# Patient Record
Sex: Female | Born: 1955
Health system: Southern US, Community
[De-identification: ages and names within clinical notes are randomized; demographics above are authoritative.]

## PROBLEM LIST (undated history)

## (undated) DIAGNOSIS — Z9221 Personal history of antineoplastic chemotherapy: Secondary | ICD-10-CM

## (undated) DIAGNOSIS — Z923 Personal history of irradiation: Secondary | ICD-10-CM

## (undated) DIAGNOSIS — C801 Malignant (primary) neoplasm, unspecified: Secondary | ICD-10-CM

## (undated) HISTORY — PX: BREAST SURGERY: SHX581

## (undated) HISTORY — PX: BREAST LUMPECTOMY: SHX2

---

## 2018-07-21 ENCOUNTER — Other Ambulatory Visit (HOSPITAL_COMMUNITY): Payer: Self-pay | Admitting: *Deleted

## 2018-07-21 DIAGNOSIS — N644 Mastodynia: Secondary | ICD-10-CM

## 2018-07-21 DIAGNOSIS — Z853 Personal history of malignant neoplasm of breast: Secondary | ICD-10-CM

## 2018-08-11 ENCOUNTER — Ambulatory Visit
Admission: RE | Admit: 2018-08-11 | Discharge: 2018-08-11 | Disposition: A | Discharge status: Home / Self Care | Payer: No Typology Code available for payment source | Source: Ambulatory Visit | Attending: Obstetrics and Gynecology | Admitting: Obstetrics and Gynecology

## 2018-08-11 ENCOUNTER — Ambulatory Visit (HOSPITAL_COMMUNITY)
Admission: RE | Admit: 2018-08-11 | Discharge: 2018-08-11 | Disposition: A | Discharge status: Home / Self Care | Payer: Self-pay | Source: Ambulatory Visit | Attending: Obstetrics and Gynecology | Admitting: Obstetrics and Gynecology

## 2018-08-11 ENCOUNTER — Encounter (HOSPITAL_COMMUNITY): Payer: Self-pay

## 2018-08-11 ENCOUNTER — Ambulatory Visit: Payer: Self-pay

## 2018-08-11 VITALS — BP 130/72 | Ht 65.0 in | Wt 180.5 lb

## 2018-08-11 DIAGNOSIS — N644 Mastodynia: Secondary | ICD-10-CM

## 2018-08-11 DIAGNOSIS — N6311 Unspecified lump in the right breast, upper outer quadrant: Secondary | ICD-10-CM

## 2018-08-11 DIAGNOSIS — Z853 Personal history of malignant neoplasm of breast: Secondary | ICD-10-CM

## 2018-08-11 DIAGNOSIS — Z01419 Encounter for gynecological examination (general) (routine) without abnormal findings: Secondary | ICD-10-CM

## 2018-08-11 HISTORY — DX: Malignant (primary) neoplasm, unspecified: C80.1

## 2018-08-11 HISTORY — DX: Personal history of antineoplastic chemotherapy: Z92.21

## 2018-08-11 HISTORY — DX: Personal history of irradiation: Z92.3

## 2018-08-11 NOTE — Progress Notes (Signed)
Complaints of right breast pain x 2 months that comes and goes. Patient rates the pain at a 5 out of 10. Patient stated she noticed some blood at the area of her breast surgery when the pain started.  Pap Smear: Pap smear completed today. Last Pap smear was in 2015 and abnormal per patient. Per patient had a colposcopy completed for follow-up. Patient stated she has a history of multiple abnormal Pap smears. Patient stated she has a history of a hysterectomy around 22 years ago for fibroids but was told she still has her cervix. No Pap smear or hysterectomy results in Epic.  Physical exam: Breasts Right breast slightly smaller than left breast due to history of right breast lumpectomy for breast cancer in 2007. No skin abnormalities left breast. Scar observed on right lower breast around 6 o'clock. No nipple retraction bilateral breasts. No nipple discharge bilateral breasts. No lymphadenopathy. No lumps palpated left breast. Palpated a lump within the right breast at 10 o'clock 5 cm from the nipple. Complaints of tenderness when palpated lump and area around lump. Referred patient to the [facility] for a diagnostic mammogram and right breast ultrasound. Appointment scheduled for Tuesday, August 11, 2018 at 1050.        Pelvic/Bimanual   Ext Genitalia No lesions, no swelling and no discharge observed on external genitalia.         Vagina Vagina pink and normal texture. No lesions or discharge observed in vagina.          Cervix Cervix is present. Cervix pink and of normal texture. No discharge observed.     Uterus Uterus is absent to history of hysterectomy for benign reasons.       Adnexae Bilateral ovaries present and palpable. No tenderness on palpation.         Rectovaginal No rectal exam completed today since patient had no rectal complaints. No skin abnormalities observed on exam.    Smoking History: Patient is a current smoker. Discussed smoking cessation with  patient. Referred to the [facility] Quitline and gave resources to free smoking cessation classes at [facility].  Patient Navigation: Patient education provided. Access to services provided for patient through [place] program.   Colorectal Cancer Screening: Per patient has never had a colonoscopy completed. No complaints today. FIT Test given to patient to complete and return to [facility].  Breast and Cervical Cancer Risk Assessment: Patient has no family history of breast cancer, known genetic mutations, or radiation treatment to the chest before age 9. Per patient has a history of cervical dysplasia. Patient has no history of being immunocompromised or DES exposure in-utero.  Risk Assessment    Risk Scores      08/11/2018   Last edited by: Loletta Parish, RN   5-year risk: 2.6 %   Lifetime risk: 13 %

## 2018-08-11 NOTE — Patient Instructions (Addendum)
Explained breast self awareness with [name]. Let patient know that the results of today's Pap smear will determine follow-up. Referred patient to the [facility] for a diagnostic mammogram and right breast ultrasound. Appointment scheduled for Tuesday, August 11, 2018 at 1050. Patient aware of appointment and will be there. Let patient know will follow up with her within the next couple weeks with results of Pap smear by letter or phone. Discussed smoking cessation with patient. Referred to the [facility] Quitline and gave resources to free smoking cessation classes at [facility]. Catherine Tyler verbalized understanding.  Alexsandro Salek, Arvil Chaco, RN 10:42 AM

## 2018-08-12 ENCOUNTER — Other Ambulatory Visit (HOSPITAL_COMMUNITY): Payer: Self-pay | Admitting: *Deleted

## 2018-08-12 ENCOUNTER — Inpatient Hospital Stay: Payer: No Typology Code available for payment source | Attending: Obstetrics and Gynecology | Admitting: *Deleted

## 2018-08-12 ENCOUNTER — Inpatient Hospital Stay: Payer: No Typology Code available for payment source

## 2018-08-12 VITALS — BP 145/85 | Ht 65.0 in | Wt 181.9 lb

## 2018-08-12 DIAGNOSIS — Z0000 Encounter for general adult medical examination without abnormal findings: Secondary | ICD-10-CM

## 2018-08-12 LAB — LIPID PANEL
Cholesterol: 272 mg/dL — ABNORMAL HIGH (ref 0–200)
HDL: 46 mg/dL (ref >40)
LDL CALC: 185 mg/dL — AB (ref 0–99)
Total CHOL/HDL Ratio: 5.9 RATIO
Triglycerides: 204 mg/dL — ABNORMAL HIGH (ref <150)
VLDL: 41 mg/dL — ABNORMAL HIGH (ref 0–40)

## 2018-08-12 LAB — HEMOGLOBIN A1C
HEMOGLOBIN A1C: 5.5 % (ref 4.8–5.6)
Mean Plasma Glucose: 111.15 mg/dL

## 2018-08-12 NOTE — Progress Notes (Signed)
[facility] initial screening  Clinical Measurement:  Height: 65in  Weight: 181.9lb  Blood Pressure: 140/87 Blood Pressure #2: 145/85 Fasting Labs Drawn Today, will review with patient when they result.  Medical History:  Patient states that she has not been diagnosed with high cholesterol,  diabetes or heart disease. Patient states she has been diagnosed with high blood pressure.  Medications:  Patients states she is not taking any medications for high cholesterol, or diabetes.  Patient states she is taking medication for high blood pressure.   She is not taking aspirin daily to prevent heart attack or stroke.    Blood pressure, self measurement:  Patients states she does  measure blood pressure at home.    Nutrition:  Patient states she eats 1 cup of fruit and 3 cups of vegetables in an average day.  Patient states she does  eat fish regularly, she eats more than half a serving of whole grains daily. She drinks less than 36 ounces of beverages with added sugar weekly.  She is currently watching her sodium intake.  She has  had drinks containing alcohol in the last seven days.    Physical activity:  Patient states that she gets 210 minutes of moderate exercise in a week.  She gets 0 minutes of vigorous exercise per week.    Smoking status:  Patient states she has never smoked and is not around any smokers.    Quality of life:  Patient states that she has had 0 bad physical days out of the last 30 days. In the last 2 weeks, she has had several  days that she has felt down or depressed. She has had 0 days in the last 2 weeks that she has had little interest or pleasure in doing things.  Risk reduction and counseling:  Patient states she wants to lose weight and increase fruit and vegetable intake.  I encouraged her to continue with current exercise regimen and increase vegetable and fruit intake.  Navigation:  I will notify patient of lab results.  Patient is aware of 2 more health coaching sessions  and a follow up.

## 2018-08-13 LAB — CYTOLOGY - PAP
DIAGNOSIS: NEGATIVE
HPV: NOT DETECTED

## 2018-08-14 ENCOUNTER — Telehealth (HOSPITAL_COMMUNITY): Payer: Self-pay | Admitting: *Deleted

## 2018-08-14 NOTE — Telephone Encounter (Signed)
  Addendum- I made an appointment for Catherine Tyler for [facility] on August 21st at 10:15am.

## 2018-08-14 NOTE — Telephone Encounter (Signed)
Health coaching 2  Labs- cholesterol 272, triglycerides 204, LDL 185, HDL 46, hemoglobin A1C 5.5, mean plasma glucose 111.15  Patient is aware and understands her lab results.  Goals- I encouraged patient to eat more lean meats, fish and skinless chicken, also to choose low-fat or fat free dairy options and to exercise moderately 150 minutes weekly. Patient states she walks 30 minutes daily and cooks vegetables oven roasted in olive oil.    Navigation:   Patient is aware of  more health coaching sessions and a follow up.  Time- 10 minutes

## 2018-08-19 ENCOUNTER — Ambulatory Visit: Payer: No Typology Code available for payment source

## 2018-08-19 ENCOUNTER — Other Ambulatory Visit: Payer: Self-pay | Admitting: Obstetrics and Gynecology

## 2018-08-25 ENCOUNTER — Encounter (HOSPITAL_COMMUNITY): Payer: Self-pay

## 2018-08-25 LAB — FECAL OCCULT BLOOD, IMMUNOCHEMICAL: Fecal Occult Bld: NEGATIVE

## 2018-08-25 LAB — SPECIMEN STATUS REPORT

## 2018-09-01 ENCOUNTER — Encounter (HOSPITAL_COMMUNITY): Payer: Self-pay | Admitting: *Deleted

## 2018-09-01 NOTE — Progress Notes (Signed)
Letter mailed to patient with negative pap smear results. HPV was negative. Next pap smear due in one year due to history of abnormal pap smears.  

## 2018-09-02 ENCOUNTER — Ambulatory Visit (INDEPENDENT_AMBULATORY_CARE_PROVIDER_SITE_OTHER): Payer: Self-pay | Admitting: Internal Medicine

## 2018-09-02 VITALS — BP 151/89 | HR 78 | Temp 97.7°F | Ht 65.0 in | Wt 183.5 lb

## 2018-09-02 DIAGNOSIS — Z8249 Family history of ischemic heart disease and other diseases of the circulatory system: Secondary | ICD-10-CM

## 2018-09-02 DIAGNOSIS — Z0000 Encounter for general adult medical examination without abnormal findings: Secondary | ICD-10-CM

## 2018-09-02 DIAGNOSIS — F329 Major depressive disorder, single episode, unspecified: Secondary | ICD-10-CM

## 2018-09-02 DIAGNOSIS — Z79899 Other long term (current) drug therapy: Secondary | ICD-10-CM

## 2018-09-02 DIAGNOSIS — I10 Essential (primary) hypertension: Secondary | ICD-10-CM

## 2018-09-02 DIAGNOSIS — Z923 Personal history of irradiation: Secondary | ICD-10-CM

## 2018-09-02 DIAGNOSIS — Z853 Personal history of malignant neoplasm of breast: Secondary | ICD-10-CM

## 2018-09-02 DIAGNOSIS — E785 Hyperlipidemia, unspecified: Secondary | ICD-10-CM | POA: Insufficient documentation

## 2018-09-02 DIAGNOSIS — M79672 Pain in left foot: Secondary | ICD-10-CM

## 2018-09-02 DIAGNOSIS — F17210 Nicotine dependence, cigarettes, uncomplicated: Secondary | ICD-10-CM

## 2018-09-02 DIAGNOSIS — Z23 Encounter for immunization: Secondary | ICD-10-CM

## 2018-09-02 DIAGNOSIS — F32A Depression, unspecified: Secondary | ICD-10-CM | POA: Insufficient documentation

## 2018-09-02 DIAGNOSIS — M79671 Pain in right foot: Secondary | ICD-10-CM

## 2018-09-02 DIAGNOSIS — F419 Anxiety disorder, unspecified: Secondary | ICD-10-CM

## 2018-09-02 DIAGNOSIS — Z7189 Other specified counseling: Secondary | ICD-10-CM | POA: Insufficient documentation

## 2018-09-02 DIAGNOSIS — Z9221 Personal history of antineoplastic chemotherapy: Secondary | ICD-10-CM

## 2018-09-02 DIAGNOSIS — Z8541 Personal history of malignant neoplasm of cervix uteri: Secondary | ICD-10-CM

## 2018-09-02 MED ORDER — LISINOPRIL 10 MG PO TABS: 10.0000 mg | ORAL_TABLET | Freq: Every day | ORAL | 11 refills | Status: DC

## 2018-09-02 MED ORDER — ROSUVASTATIN CALCIUM 5 MG PO TABS: 5.0000 mg | ORAL_TABLET | Freq: Every day | ORAL | 11 refills | Status: DC

## 2018-09-02 MED ORDER — DULOXETINE HCL 30 MG PO CPEP: 30.0000 mg | ORAL_CAPSULE | Freq: Every day | ORAL | 2 refills | Status: DC

## 2018-09-02 MED FILL — LISINOPRIL 10 MG TABLET: 10 | 30 days supply | Qty: 30 | Fill #0

## 2018-09-02 MED FILL — DULoxetine HCL 30 MG CPEP: 30 | 30 days supply | Qty: 30 | Fill #0

## 2018-09-02 MED FILL — ROSUVASTATIN CALCIUM 5 MG T: 5 | 30 days supply | Qty: 30 | Fill #0

## 2018-09-02 NOTE — Assessment & Plan Note (Signed)
Recent lipid profile done at [facility] program shows elevated total cholesterol, triglyceride and LDL. Her ASCVD risk score is 17.7% without treatment which can be decreased to 2.3% with risk modification. She will need moderate to high intensity statin. Patient she has tried Lipitor in the past which causes headache and her.  We will start her on low-dose Crestor at 5 mg daily-we will titrate according to her response.

## 2018-09-02 NOTE — Assessment & Plan Note (Signed)
Appears little depressed and tearful during clinic visit. PHQ 9 score was 18. She is feeling more depressed and anxious since the death of her sister in Jul 06, 2018.  No suicidal ideations.  We will check TSH. Start her on Cymbalta 30 mg daily as it can help with her depression and foot pain. Follow-up in 1 month.

## 2018-09-02 NOTE — Assessment & Plan Note (Signed)
BP Readings from Last 3 Encounters:  09/02/18 (!) 151/89  08/12/18 (!) 145/85  08/11/18 130/72   Her blood pressure was elevated. Patient ran out of her lisinopril for the past month and never went back to get it because of financial reasons.  -Restart lisinopril at 10 mg daily. -Check BMP. -Follow-up in 1 month.

## 2018-09-02 NOTE — Progress Notes (Addendum)
CC: Hypertension from [place] program.  HPI:  [name] is a 62 y.o. 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 program because of elevated blood pressure.  According to patient she has elevated blood pressure in the past and was given lisinopril 10 mg daily from a minute clinic at [facility], patient ran out of her prescription for the past 1 month and never went back because of the cost issues.  She denies any headache, blurry vision, chest pain, exertional dyspnea, orthopnea or PND.  She was complaining of bilateral foot pain more on left, pain is on her plantar surface describes it as sharp, worse in the morning when she get out of bed, lasted for about 10 minutes and resolved on its own, pain normally come back in the afternoon when she remained on her feet for long time.  She occasionally uses ibuprofen which help with her pain.  She denies any swelling or fever.  She is able to carry on her daily living without any difficulty.  She was also complaining of some anxiety and depression since 06-28-2018 when her sister passed away at the age of 5 due to lung cancer.  Her PHQ 9 score today was 18 and she would like some medication to help with her symptoms. She denies any heat or cold intolerance, dry skin or change in her bowel habits or appetite.  She denies any suicidal ideations.  Addendum.  Her labs which include CBC, BMP and TSH are all within normal limit.  Past Medical History:  Diagnosis Date  . Cancer ([place])   . Personal history of chemotherapy   . Personal history of radiation therapy    Family history.  Multiple family members with hypertension.  Sister died of lung cancer at the age of 36.  Social history.  Lives unknown, have a part-time job.  Smokes about 4 to 5 cigarettes/day since  the age 89, occasionally drinks a beer, approximately 1-2 times per week and denies any illicit drug use.  Review of Systems: Negative except mentioned in HPI.  Physical Exam:  Vitals:   09/02/18 1028  BP: (!) 151/89  Pulse: 78  Temp: 97.7 F (36.5 C)  TempSrc: Oral  SpO2: 98%  Weight: 183 lb 8 oz (83.2 kg)  Height: 5\' 5"  (1.651 m)   General: Vital signs reviewed.  Patient is well-developed and well-nourished, in no acute distress and cooperative with exam.  Head: Normocephalic and atraumatic. Eyes: EOMI, conjunctivae normal, no scleral icterus.  Neck: Supple, trachea midline, normal ROM, no JVD, masses, thyromegaly, or carotid bruit present.  Cardiovascular: RRR, S1 normal, S2 normal, no murmurs, gallops, or rubs. Pulmonary/Chest: Clear to auscultation bilaterally, no wheezes, rales, or rhonchi. Abdominal: Soft, non-tender, non-distended, BS +, Musculoskeletal: No joint deformities, erythema, or stiffness, ROM full and nontender. Extremities: No lower extremity edema bilaterally,  pulses symmetric and intact bilaterally. No cyanosis or clubbing. Neurological: A&O x3, Strength is normal and symmetric bilaterally, cranial nerve II-XII are grossly intact, no focal motor deficit, sensory intact to light touch bilaterally.  Skin: Warm, dry and intact.  Healing clustered back bite marks on her anterior right axilla and upper right breast. Psychiatric: Normal mood and affect. speech and behavior is normal. Cognition and memory are normal.  Assessment & Plan:   See Encounters Tab for problem  based charting.  Patient discussed with Dr. Evette Doffing.

## 2018-09-02 NOTE — Patient Instructions (Addendum)
Thank you for visiting clinic today. We discussed I am starting you 3 medications today, 1 lisinopril which you were used to take before for your blood pressure as your blood pressure was little high. Second I am starting you on Cymbalta 30 mg daily which can help with your foot pain and depression and anxiety. I am also starting you on a low-dose cholesterol medicine because of your high cholesterol, as high cholesterol along with high blood pressure makes you high risk for heart attacks and stroke. We are checking some labs which include your thyroid and kidney function, we will call you with any abnormal results. You are also provided with flu shot today. I am also putting some exercises to do to help with your foot pain. You can use ibuprofen as needed to help with your pain.  follow-up in 1 month for your blood pressure check.  Plantar Fasciitis Rehab Ask your health care provider which exercises are safe for you. Do exercises exactly as told by your health care provider and adjust them as directed. It is normal to feel mild stretching, pulling, tightness, or discomfort as you do these exercises, but you should stop right away if you feel sudden pain or your pain gets worse. Do not begin these exercises until told by your health care provider. Stretching and range of motion exercises These exercises warm up your muscles and joints and improve the movement and flexibility of your foot. These exercises also help to relieve pain. Exercise A: Plantar fascia stretch  1. Sit with your left / right leg crossed over your opposite knee. 2. Hold your heel with one hand with that thumb near your arch. With your other hand, hold your toes and gently pull them back toward the top of your foot. You should feel a stretch on the bottom of your toes or your foot or both. 3. Hold this stretch for__________ seconds. 4. Slowly release your toes and return to the starting position. Repeat __________ times.  Complete this exercise __________ times a day. Exercise B: Gastroc, standing  1. Stand with your hands against a wall. 2. Extend your left / right leg behind you, and bend your front knee slightly. 3. Keeping your heels on the floor and keeping your back knee straight, shift your weight toward the wall without arching your back. You should feel a gentle stretch in your left / right calf. 4. Hold this position for __________ seconds. Repeat __________ times. Complete this exercise __________ times a day. Exercise C: Soleus, standing 1. Stand with your hands against a wall. 2. Extend your left / right leg behind you, and bend your front knee slightly. 3. Keeping your heels on the floor, bend your back knee and slightly shift your weight over the back leg. You should feel a gentle stretch deep in your calf. 4. Hold this position for __________ seconds. Repeat __________ times. Complete this exercise __________ times a day. Exercise D: Gastrocsoleus, standing 1. Stand with the ball of your left / right foot on a step. The ball of your foot is on the walking surface, right under your toes. 2. Keep your other foot firmly on the same step. 3. Hold onto the wall or a railing for balance. 4. Slowly lift your other foot, allowing your body weight to press your heel down over the edge of the step. You should feel a stretch in your left / right calf. 5. Hold this position for __________ seconds. 6. Return both feet to the  step. 7. Repeat this exercise with a slight bend in your left / right knee. Repeat __________ times with your left / right knee straight and __________ times with your left / right knee bent. Complete this exercise __________ times a day. Balance exercise This exercise builds your balance and strength control of your arch to help take pressure off your plantar fascia. Exercise E: Single leg stand 1. Without shoes, stand near a railing or in a doorway. You may hold onto the railing or  door frame as needed. 2. Stand on your left / right foot. Keep your big toe down on the floor and try to keep your arch lifted. Do not let your foot roll inward. 3. Hold this position for __________ seconds. 4. If this exercise is too easy, you can try it with your eyes closed or while standing on a pillow. Repeat __________ times. Complete this exercise __________ times a day. This information is not intended to replace advice given to you by your health care provider. Make sure you discuss any questions you have with your health care provider. Document Released: 12/16/2005 Document Revised: 08/20/2016 Document Reviewed: 10/30/2015 Elsevier Interactive Patient Education  2018 [place].   Plantar Fasciitis Plantar fasciitis is a painful foot condition that affects the heel. It occurs when the band of tissue that connects the toes to the heel bone (plantar fascia) becomes irritated. This can happen after exercising too much or doing other repetitive activities (overuse injury). The pain from plantar fasciitis can range from mild irritation to severe pain that makes it difficult for you to walk or move. The pain is usually worse in the morning or after you have been sitting or lying down for a while. What are the causes? This condition may be caused by:  Standing for long periods of time.  Wearing shoes that do not fit.  Doing high-impact activities, including running, aerobics, and ballet.  Being overweight.  Having an abnormal way of walking (gait).  Having tight calf muscles.  Having high arches in your feet.  Starting a new athletic activity.  What are the signs or symptoms? The main symptom of this condition is heel pain. Other symptoms include:  Pain that gets worse after activity or exercise.  Pain that is worse in the morning or after resting.  Pain that goes away after you walk for a few minutes.  How is this diagnosed? This condition may be diagnosed based on  your signs and symptoms. Your health care provider will also do a physical exam to check for:  A tender area on the bottom of your foot.  A high arch in your foot.  Pain when you move your foot.  Difficulty moving your foot.  You may also need to have imaging studies to confirm the diagnosis. These can include:  X-rays.  Ultrasound.  MRI.  How is this treated? Treatment for plantar fasciitis depends on the severity of the condition. Your treatment may include:  Rest, ice, and over-the-counter pain medicines to manage your pain.  Exercises to stretch your calves and your plantar fascia.  A splint that holds your foot in a stretched, upward position while you sleep (night splint).  Physical therapy to relieve symptoms and prevent problems in the future.  Cortisone injections to relieve severe pain.  Extracorporeal shock wave therapy (ESWT) to stimulate damaged plantar fascia with electrical impulses. It is often used as a last resort before surgery.  Surgery, if other treatments have not worked after  12 months.  Follow these instructions at home:  Take medicines only as directed by your health care provider.  Avoid activities that cause pain.  Roll the bottom of your foot over a bag of ice or a bottle of cold water. Do this for 20 minutes, 3-4 times a day.  Perform simple stretches as directed by your health care provider.  Try wearing athletic shoes with air-sole or gel-sole cushions or soft shoe inserts.  Wear a night splint while sleeping, if directed by your health care provider.  Keep all follow-up appointments with your health care provider. How is this prevented?  Do not perform exercises or activities that cause heel pain.  Consider finding low-impact activities if you continue to have problems.  Lose weight if you need to. The best way to prevent plantar fasciitis is to avoid the activities that aggravate your plantar fascia. Contact a health care  provider if:  Your symptoms do not go away after treatment with home care measures.  Your pain gets worse.  Your pain affects your ability to move or do your daily activities. This information is not intended to replace advice given to you by your health care provider. Make sure you discuss any questions you have with your health care provider. Document Released: 09/10/2001 Document Revised: 05/20/2016 Document Reviewed: 10/26/2014 Elsevier Interactive Patient Education  [employer].

## 2018-09-02 NOTE — Assessment & Plan Note (Signed)
Patient has a recent negative fecal occult blood test done in August 2019. Had a normal mammogram and Pap smear done recently. Recent A1c done was normal at 5.5 Recent lipid profile shows dyslipidemia with elevated total cholesterol, triglyceride and LDL. She was provided with flu shot today.

## 2018-09-03 LAB — BMP8+ANION GAP
Anion Gap: 15 mmol/L (ref 10.0–18.0)
BUN/Creatinine Ratio: 28 (ref 12–28)
BUN: 22 mg/dL (ref 8–27)
CO2: 24 mmol/L (ref 20–29)
CREATININE: 0.78 mg/dL (ref 0.57–1.00)
Calcium: 9.4 mg/dL (ref 8.7–10.3)
Chloride: 101 mmol/L (ref 96–106)
GFR calc non Af Amer: 83 mL/min/{1.73_m2} (ref >59)
GFR, EST AFRICAN AMERICAN: 96 mL/min/{1.73_m2} (ref >59)
Glucose: 81 mg/dL (ref 65–99)
Potassium: 5 mmol/L (ref 3.5–5.2)
Sodium: 140 mmol/L (ref 134–144)

## 2018-09-03 LAB — CBC
Hematocrit: 43.3 % (ref 34.0–46.6)
Hemoglobin: 14.9 g/dL (ref 11.1–15.9)
MCH: 29.2 pg (ref 26.6–33.0)
MCHC: 34.4 g/dL (ref 31.5–35.7)
MCV: 85 fL (ref 79–97)
Platelets: 267 10*3/uL (ref 150–450)
RBC: 5.11 x10E6/uL (ref 3.77–5.28)
RDW: 13 % (ref 12.3–15.4)
WBC: 7.6 10*3/uL (ref 3.4–10.8)

## 2018-09-03 LAB — TSH: TSH: 2.02 u[IU]/mL (ref 0.450–4.500)

## 2018-09-03 NOTE — Progress Notes (Signed)
Internal Medicine Clinic Attending  Case discussed with Dr. Amin at the time of the visit.  We reviewed the resident's history and exam and pertinent patient test results.  I agree with the assessment, diagnosis, and plan of care documented in the resident's note.    

## 2018-09-25 ENCOUNTER — Telehealth (HOSPITAL_COMMUNITY): Payer: Self-pay | Admitting: *Deleted

## 2018-09-25 NOTE — Telephone Encounter (Signed)
Health coaching 3   Goals- Patient states she is walking 3 to 4 times weekly for 30 minute intervals.  Patient states she is making healthier choices with regard to food, like choosing lean meats, eating fish( salmon or mackerel) 2 times weekly and eating more fruit and vegetables (3 or 4 daily).  Patient states she cooks with olive oil and canola oil and snacks on walnuts and almonds.  Navigation:  Patient is aware of  a follow up.  Time- 10 minutes

## 2018-10-26 MED FILL — LISINOPRIL 10 MG TABLET: 10 | 30 days supply | Qty: 30 | Fill #1

## 2018-11-10 ENCOUNTER — Emergency Department (HOSPITAL_COMMUNITY): Payer: Self-pay

## 2018-11-10 ENCOUNTER — Encounter (HOSPITAL_COMMUNITY): Payer: Self-pay | Admitting: Emergency Medicine

## 2018-11-10 ENCOUNTER — Emergency Department (HOSPITAL_COMMUNITY)
Admission: EM | Admit: 2018-11-10 | Discharge: 2018-11-10 | Disposition: A | Discharge status: Home / Self Care | Payer: Self-pay | Attending: Emergency Medicine | Admitting: Emergency Medicine

## 2018-11-10 ENCOUNTER — Other Ambulatory Visit: Payer: Self-pay

## 2018-11-10 DIAGNOSIS — Y929 Unspecified place or not applicable: Secondary | ICD-10-CM | POA: Insufficient documentation

## 2018-11-10 DIAGNOSIS — I10 Essential (primary) hypertension: Secondary | ICD-10-CM | POA: Insufficient documentation

## 2018-11-10 DIAGNOSIS — F17210 Nicotine dependence, cigarettes, uncomplicated: Secondary | ICD-10-CM | POA: Insufficient documentation

## 2018-11-10 DIAGNOSIS — R1012 Left upper quadrant pain: Secondary | ICD-10-CM | POA: Insufficient documentation

## 2018-11-10 DIAGNOSIS — S0003XA Contusion of scalp, initial encounter: Secondary | ICD-10-CM | POA: Insufficient documentation

## 2018-11-10 DIAGNOSIS — Y939 Activity, unspecified: Secondary | ICD-10-CM | POA: Insufficient documentation

## 2018-11-10 DIAGNOSIS — Z79899 Other long term (current) drug therapy: Secondary | ICD-10-CM | POA: Insufficient documentation

## 2018-11-10 DIAGNOSIS — W19XXXA Unspecified fall, initial encounter: Secondary | ICD-10-CM

## 2018-11-10 DIAGNOSIS — W109XXA Fall (on) (from) unspecified stairs and steps, initial encounter: Secondary | ICD-10-CM | POA: Insufficient documentation

## 2018-11-10 DIAGNOSIS — Y999 Unspecified external cause status: Secondary | ICD-10-CM | POA: Insufficient documentation

## 2018-11-10 LAB — CBC WITH DIFFERENTIAL/PLATELET
Abs Immature Granulocytes: 0.04 10*3/uL (ref 0.00–0.07)
Basophils Absolute: 0.1 10*3/uL (ref 0.0–0.1)
Basophils Relative: 0 %
EOS ABS: 0 10*3/uL (ref 0.0–0.5)
EOS PCT: 0 %
HCT: 50.2 % — ABNORMAL HIGH (ref 36.0–46.0)
HEMOGLOBIN: 16.3 g/dL — AB (ref 12.0–15.0)
Immature Granulocytes: 0 %
LYMPHS PCT: 11 %
Lymphs Abs: 1.4 10*3/uL (ref 0.7–4.0)
MCH: 28.8 pg (ref 26.0–34.0)
MCHC: 32.5 g/dL (ref 30.0–36.0)
MCV: 88.8 fL (ref 80.0–100.0)
MONO ABS: 0.5 10*3/uL (ref 0.1–1.0)
Monocytes Relative: 4 %
Neutro Abs: 10.6 10*3/uL — ABNORMAL HIGH (ref 1.7–7.7)
Neutrophils Relative %: 85 %
Platelets: 273 10*3/uL (ref 150–400)
RBC: 5.65 MIL/uL — ABNORMAL HIGH (ref 3.87–5.11)
RDW: 12.7 % (ref 11.5–15.5)
WBC: 12.7 10*3/uL — ABNORMAL HIGH (ref 4.0–10.5)
nRBC: 0 % (ref 0.0–0.2)

## 2018-11-10 LAB — COMPREHENSIVE METABOLIC PANEL
ALK PHOS: 91 U/L (ref 38–126)
ALT: 42 U/L (ref 0–44)
AST: 35 U/L (ref 15–41)
Albumin: 4 g/dL (ref 3.5–5.0)
Anion gap: 8 (ref 5–15)
BUN: 10 mg/dL (ref 6–20)
CALCIUM: 9.5 mg/dL (ref 8.9–10.3)
CHLORIDE: 103 mmol/L (ref 98–111)
CO2: 26 mmol/L (ref 22–32)
CREATININE: 0.73 mg/dL (ref 0.44–1.00)
Glucose, Bld: 126 mg/dL — ABNORMAL HIGH (ref 70–99)
Potassium: 4.5 mmol/L (ref 3.5–5.1)
SODIUM: 137 mmol/L (ref 135–145)
Total Bilirubin: 0.5 mg/dL (ref 0.3–1.2)
Total Protein: 7.9 g/dL (ref 6.5–8.1)

## 2018-11-10 MED ORDER — IOHEXOL 300 MG/ML  SOLN
100.0000 mL | Freq: Once | INTRAMUSCULAR | Status: AC | PRN
  Administered 2018-11-10: 100 mL via INTRAVENOUS

## 2018-11-10 MED ORDER — METHOCARBAMOL 500 MG PO TABS: 500.0000 mg | ORAL_TABLET | Freq: Two times a day (BID) | ORAL | 0 refills | Status: DC

## 2018-11-10 MED ORDER — ONDANSETRON HCL 4 MG/2ML IJ SOLN
4.0000 mg | Freq: Once | INTRAMUSCULAR | Status: AC
  Administered 2018-11-10: 4 mg via INTRAVENOUS
  Filled 2018-11-10: qty 2

## 2018-11-10 MED ORDER — PROMETHAZINE HCL 25 MG/ML IJ SOLN
6.2500 mg | Freq: Once | INTRAMUSCULAR | Status: AC
  Administered 2018-11-10: 6.25 mg via INTRAVENOUS
  Filled 2018-11-10: qty 1

## 2018-11-10 MED ORDER — ONDANSETRON 4 MG PO TBDP: 4.0000 mg | ORAL_TABLET | Freq: Three times a day (TID) | ORAL | 0 refills | Status: DC | PRN

## 2018-11-10 MED ORDER — SODIUM CHLORIDE 0.9 % IV BOLUS
500.0000 mL | Freq: Once | INTRAVENOUS | Status: AC
  Administered 2018-11-10: 500 mL via INTRAVENOUS

## 2018-11-10 MED ORDER — MORPHINE SULFATE (PF) 4 MG/ML IV SOLN
4.0000 mg | Freq: Once | INTRAVENOUS | Status: AC
  Administered 2018-11-10: 4 mg via INTRAVENOUS
  Filled 2018-11-10: qty 1

## 2018-11-10 MED ORDER — MORPHINE SULFATE (PF) 2 MG/ML IV SOLN
2.0000 mg | Freq: Once | INTRAVENOUS | Status: AC
  Administered 2018-11-10: 2 mg via INTRAVENOUS
  Filled 2018-11-10: qty 1

## 2018-11-10 MED ORDER — NAPROXEN 375 MG PO TABS: 375.0000 mg | ORAL_TABLET | Freq: Two times a day (BID) | ORAL | 0 refills | Status: DC

## 2018-11-10 NOTE — ED Notes (Signed)
Patient transported to X-ray 

## 2018-11-10 NOTE — ED Notes (Signed)
RN called regarding delay in scan

## 2018-11-10 NOTE — Discharge Instructions (Addendum)
You were seen in the emergency department today for a fall.  Your lab work did not show any significant abnormalities. Your White blood cell count was a bit elevated this could be due to the trauma.  Your imaging did not show any fractures, dislocations, or internal bleeding. You have a scalp hematoma- large bruise.  Your Abdominal CT scan showed some incidental findings that were not related to your fall. You have some stones in your kidney. You also have prominence of your left adrenal gland- this is something to discuss with your primary care provider- they may wish to order some blood work to further evaluate this. The formal read is listed below.   IMPRESSION: No evidence of injury in the abdomen or pelvis.  No organ injury. Bilateral nephrolithiasis. There is prominence of the left adrenal gland without focal mass. Findings may represent unilateral hyperplasia or underlying endocrine abnormality. Correlate clinically as for the need for further laboratory testing. Electronically Signed   By: Marybelle Killings M.D.   On: 11/10/2018 13:45   We are sending you home with medications to help with your discomfort:  - Naproxen is a nonsteroidal anti-inflammatory medication that will help with pain and swelling. Be sure to take this medication as prescribed with food, 1 pill every 12 hours,  It should be taken with food, as it can cause stomach upset, and more seriously, stomach bleeding. Do not take other nonsteroidal anti-inflammatory medications with this such as Advil, Motrin, Aleve, Mobic, Goodie Powder, or Motrin.    - Robaxin is the muscle relaxer I have prescribed, this is meant to help with muscle tightness. Be aware that this medication may make you drowsy therefore the first time you take this it should be at a time you are in an environment where you can rest. Do not drive or operate heavy machinery when taking this medication. Do not drink alcohol or take other sedating medications with this  medicine such as narcotics or benzodiazepines.  - Zofran- take every 8 hours as needed for nausea/vomiting. Allow this to dissolve under your tongue.   You make take Tylenol per over the counter dosing with these medications.   We have prescribed you new medication(s) today. Discuss the medications prescribed today with your pharmacist as they can have adverse effects and interactions with your other medicines including over the counter and prescribed medications. Seek medical evaluation if you start to experience new or abnormal symptoms after taking one of these medicines, seek care immediately if you start to experience difficulty breathing, feeling of your throat closing, facial swelling, or rash as these could be indications of a more serious allergic reaction  Please follow-up with your primary care provider within the next 1 week for reevaluation.  Return to the ER for new or worsening symptoms or any other concerns.

## 2018-11-10 NOTE — ED Provider Notes (Signed)
[facility] EMERGENCY DEPARTMENT Provider Note   CSN: [id number] Arrival date & time: 11/10/18  9735     History   Chief Complaint Chief Complaint  Patient presents with  . Fall  . Head Injury    HPI Catherine Tyler is a 62 y.o. female with a hx of HTN who presents to the ED s/p mechanical fall last night at 1900 with complaints of head, L side, and L upper leg pain. Patient was ambulating up the stairs when she miss-stepped and fell to her L side. Denies prodromal dizziness/lightheadedness/chest pain/dyspnea. States she hit her L side and L upper leg on two different steps and subsequently hit the back of her head- denies LOC. She did not fall down multiple stairs. She was able to get up and ambulate without assistance. States she woke up this AM with bruising and pain that is a 6/10 in severity. Relays nausea w/ 1 episode of emesis this AM. Denies change in vision, numbness, weakness, hematuria, or hematochezia.   Patient relays she has difficulty with narcotics, states she only does okay with low dose morphine- requesting if we give this type of medication we start low and go slow.   HPI  Past Medical History:  Diagnosis Date  . Cancer ([place])   . Personal history of chemotherapy   . Personal history of radiation therapy     Patient Active Problem List   Diagnosis Date Noted  . Hypertension 09/02/2018  . Depression 09/02/2018  . Need for immunization against influenza 09/02/2018  . Healthcare maintenance 09/02/2018  . Dyslipidemia 09/02/2018    Past Surgical History:  Procedure Laterality Date  . BREAST LUMPECTOMY    . BREAST SURGERY       OB History    Gravida  4   Para      Term      Preterm      AB  2   Living  1     SAB      TAB  2   Ectopic      Multiple      Live Births  1            Home Medications    Prior to Admission medications   Medication Sig Start Date End Date Taking? Authorizing Provider  DULoxetine  (CYMBALTA) 30 MG capsule Take 1 capsule (30 mg total) by mouth daily. 09/02/18 09/02/19  Lorella Nimrod, MD  lisinopril (PRINIVIL,ZESTRIL) 10 MG tablet Take 1 tablet (10 mg total) by mouth daily. 09/02/18 09/02/19  Lorella Nimrod, MD  rosuvastatin (CRESTOR) 5 MG tablet Take 1 tablet (5 mg total) by mouth at bedtime. 09/02/18 09/02/19  Lorella Nimrod, MD    Family History Family History  Problem Relation Age of Onset  . Hyperlipidemia Mother   . Heart failure Mother     Social History Social History   Tobacco Use  . Smoking status: Current Some Day Smoker  . Smokeless tobacco: Never Used  Substance Use Topics  . Alcohol use: Yes    Comment: on occasion  . Drug use: Never     Allergies   Sulfa antibiotics   Review of Systems Review of Systems  Constitutional: Negative for chills and fever.  Respiratory: Negative for shortness of breath.   Cardiovascular: Negative for chest pain.  Gastrointestinal: Positive for abdominal pain (L side). Negative for blood in stool.  Genitourinary: Negative for hematuria.  Musculoskeletal: Positive for arthralgias (L hip) and myalgias (L upper leg). Negative  for back pain and neck pain.  Skin: Positive for color change (bruising).  Neurological: Positive for headaches. Negative for weakness and numbness.       Negative for incontinence  All other systems reviewed and are negative.    Physical Exam Updated Vital Signs BP (!) 123/94   Pulse 97   Temp 97.8 F (36.6 C) (Oral)   Resp 16   SpO2 97%   Physical Exam  Constitutional: She appears well-developed and well-nourished. No distress.  HENT:  Head: Head is without raccoon's eyes and without Battle's sign.  Right Ear: No drainage. No hemotympanum.  Left Ear: No drainage. No hemotympanum.  Nose: No rhinorrhea.  Mouth/Throat: Uvula is midline and oropharynx is clear and moist.  L sided parietal hematoma that is tender to palpation.   Eyes: Pupils are equal, round, and reactive to light.  Conjunctivae and EOM are normal. Right eye exhibits no discharge. Left eye exhibits no discharge.  Neck: Normal range of motion. Spinous process tenderness (diffuse without palpable step off) and muscular tenderness (bilateral) present.  Cardiovascular: Normal rate and regular rhythm.  No murmur heard. Pulses:      Posterior tibial pulses are 2+ on the right side, and 2+ on the left side.  Pulmonary/Chest: Breath sounds normal. No respiratory distress. She has no wheezes. She has no rhonchi. She has no rales. She exhibits no tenderness, no crepitus, no edema, no deformity, no swelling and no retraction.  Abdominal: Soft. She exhibits no distension. There is tenderness (and left side in location consistent with ecchymosis) in the left upper quadrant. There is no rigidity, no rebound and no guarding.    Musculoskeletal:  Patient of mild bruising to the left lateral thigh area.  No obvious deformity, swelling, significant open wounds, or increased warmth. Back: No point/focal vertebral tenderness to palpation to the lumbar or thoracic spine. Upper extremities: Normal range of motion without bony tenderness Lower extremities: Full active and most of the, knees, and ankles.  She is tender directly over the bruising to the left lateral thigh.  Lower extremities are otherwise nontender.  No pelvis tenderness to palpation.  Skin: Skin is warm and dry. No rash noted.  Psychiatric: She has a normal mood and affect. Her behavior is normal.  Nursing note and vitals reviewed.    ED Treatments / Results  Labs (all labs ordered are listed, but only abnormal results are displayed) Labs Reviewed  COMPREHENSIVE METABOLIC PANEL - Abnormal; Notable for the following components:      Result Value   Glucose, Bld 126 (*)    All other components within normal limits  CBC WITH DIFFERENTIAL/PLATELET - Abnormal; Notable for the following components:   WBC 12.7 (*)    RBC 5.65 (*)    Hemoglobin 16.3 (*)    HCT  50.2 (*)    Neutro Abs 10.6 (*)    All other components within normal limits    EKG None  Radiology Ct Head Wo Contrast  Result Date: 11/10/2018 CLINICAL DATA:  PT fell while walking up her back steps. PT has bruising to left side and left upper leg. PT struck her head as well. PT reports she has a swollen area on head. PT woke up this morning feeling sick and vomited. EXAM: CT HEAD WITHOUT CONTRAST CT CERVICAL SPINE WITHOUT CONTRAST TECHNIQUE: Multidetector CT imaging of the head and cervical spine was performed following the standard protocol without intravenous contrast. Multiplanar CT image reconstructions of the cervical spine were also generated.  COMPARISON:  None. FINDINGS: CT HEAD FINDINGS Brain: No evidence of acute infarction, hemorrhage, hydrocephalus, extra-axial collection or mass lesion/mass effect. Vascular: No hyperdense vessel. Cerebrovascular atherosclerotic disease. Skull: No osseous abnormality. Sinuses/Orbits: Visualized paranasal sinuses are clear. Visualized mastoid sinuses are clear. Visualized orbits demonstrate no focal abnormality. Other: Left parietal scalp hematoma. CT CERVICAL SPINE FINDINGS Alignment: Normal. Skull base and vertebrae: No acute fracture. No primary bone lesion or focal pathologic process. Soft tissues and spinal canal: No prevertebral fluid or swelling. No visible canal hematoma. Disc levels: Disc spaces are relatively well maintained. Moderate left facet arthropathy at C2-3. No foraminal stenosis. Upper chest: Right apical scarring. Other: No fluid collection or hematoma. IMPRESSION: 1. No acute intracranial pathology. 2.  No acute osseous injury of the cervical spine. 3. Large left parietal scalp hematoma. Electronically Signed   By: Kathreen Devoid   On: 11/10/2018 13:42   Ct Cervical Spine Wo Contrast  Result Date: 11/10/2018 CLINICAL DATA:  PT fell while walking up her back steps. PT has bruising to left side and left upper leg. PT struck her head as  well. PT reports she has a swollen area on head. PT woke up this morning feeling sick and vomited. EXAM: CT HEAD WITHOUT CONTRAST CT CERVICAL SPINE WITHOUT CONTRAST TECHNIQUE: Multidetector CT imaging of the head and cervical spine was performed following the standard protocol without intravenous contrast. Multiplanar CT image reconstructions of the cervical spine were also generated. COMPARISON:  None. FINDINGS: CT HEAD FINDINGS Brain: No evidence of acute infarction, hemorrhage, hydrocephalus, extra-axial collection or mass lesion/mass effect. Vascular: No hyperdense vessel. Cerebrovascular atherosclerotic disease. Skull: No osseous abnormality. Sinuses/Orbits: Visualized paranasal sinuses are clear. Visualized mastoid sinuses are clear. Visualized orbits demonstrate no focal abnormality. Other: Left parietal scalp hematoma. CT CERVICAL SPINE FINDINGS Alignment: Normal. Skull base and vertebrae: No acute fracture. No primary bone lesion or focal pathologic process. Soft tissues and spinal canal: No prevertebral fluid or swelling. No visible canal hematoma. Disc levels: Disc spaces are relatively well maintained. Moderate left facet arthropathy at C2-3. No foraminal stenosis. Upper chest: Right apical scarring. Other: No fluid collection or hematoma. IMPRESSION: 1. No acute intracranial pathology. 2.  No acute osseous injury of the cervical spine. 3. Large left parietal scalp hematoma. Electronically Signed   By: Kathreen Devoid   On: 11/10/2018 13:42   Ct Abdomen Pelvis W Contrast  Result Date: 11/10/2018 CLINICAL DATA:  Fall walking up stairs EXAM: CT ABDOMEN AND PELVIS WITH CONTRAST TECHNIQUE: Multidetector CT imaging of the abdomen and pelvis was performed using the standard protocol following bolus administration of intravenous contrast. CONTRAST:  130mL OMNIPAQUE IOHEXOL 300 MG/ML  SOLN COMPARISON:  None. FINDINGS: Lower chest: Dependent atelectasis. 3 mm left lower lobe pulmonary nodule on image 5.  Hepatobiliary: Diffuse hepatic steatosis.  Normal gallbladder. Pancreas: Unremarkable Spleen: Unremarkable Adrenals/Urinary Tract: Right adrenal gland is within normal limits. There is prominence of the left adrenal gland without focal mass. The gland maintains its normal morphology without evidence of hemorrhage. Small calculus in the upper pole of the left kidney measures 3 mm. Tiny calculi in the lower poles of both kidneys. No evidence of hydronephrosis or renal mass. No evidence of renal injury. Bladder is within normal limits. Stomach/Bowel: Normal appendix. No obvious mass in the colon. No evidence of small-bowel obstruction. Vascular/Lymphatic: Aortic atherosclerotic calcifications. No abnormal retroperitoneal adenopathy. Reproductive: Uterus is absent.  Adnexa are within normal limits. Other: No free fluid.  No hemoperitoneum. Musculoskeletal: No vertebral compression deformity. Advanced  degenerative disc disease at L5-S1. IMPRESSION: No evidence of injury in the abdomen or pelvis.  No organ injury. Bilateral nephrolithiasis. There is prominence of the left adrenal gland without focal mass. Findings may represent unilateral hyperplasia or underlying endocrine abnormality. Correlate clinically as for the need for further laboratory testing. Electronically Signed   By: Marybelle Killings M.D.   On: 11/10/2018 13:45   Dg Femur Min 2 Views Left  Result Date: 11/10/2018 CLINICAL DATA:  Left upper leg pain since a fall last night. Bruising. EXAM: LEFT FEMUR 2 VIEWS COMPARISON:  None. FINDINGS: There is no fracture or dislocation. Uniform joint space narrowing of the left hip joint. Soft tissues appear normal. IMPRESSION: No acute abnormalities. Electronically Signed   By: Lorriane Shire M.D.   On: 11/10/2018 10:21    Procedures Procedures (including critical care time)  Medications Ordered in ED Medications  ondansetron (ZOFRAN) injection 4 mg (has no administration in time range)  morphine 2 MG/ML  injection 2 mg (has no administration in time range)    Initial Impression / Assessment and Plan / ED Course  I have reviewed the triage vital signs and the nursing notes.  Pertinent labs & imaging results that were available during my care of the patient were reviewed by me and considered in my medical decision making (see chart for details).   Patient presents to the emergency department multiple mechanical fall with headache, left side, and left upper leg pain.  Patient nontoxic-appearing, vitals without significant abnormality- intermittent BP elevation- doubt HTN emergency.  Will obtain CT head and cervical spine per [place] head CT/C-spine rules.  Additionally we will obtain basic labs with CT abdomen/pelvis with contrast secondary to bruising to left side of the abdomen with left upper quadrant tenderness, concern for possible splenic injury- patient currently appears hemodynamically stable  Plain film of L femur ordered- negative for fracture/dislocation, NVI distally.   Patient's labs unremarkable, non specific leukocytosis and elevation in hgb. No anemia. No significant electrolyte disturbance. Renal function preserved. Imaging is negative for acute traumatic injury other than notable left parietal scalp hematoma.  There is no evidence of intracranial bleed, fracture/dislocation of the spine or the upper portion of the left lower extremity, or internal organ injury including the spleen. Patient ambulatory without neuro deficits, tolerating PO in the ED. Feel she is safe for discharge at this time. Will discharge home with naproxen, robaxin, and zofran- patient aware she is not to drive or operate heavy machinery when taking robaxin. I discussed results- including incidental adrenal findings, treatment plan, need for follow-up, and return precautions with the patient. Provided opportunity for questions, patient confirmed understanding and is in agreement with plan.    Final Clinical  Impressions(s) / ED Diagnoses   Final diagnoses:  Fall, initial encounter  Hematoma of scalp, initial encounter    ED Discharge Orders         Ordered    naproxen (NAPROSYN) 375 MG tablet  2 times daily     11/10/18 1421    methocarbamol (ROBAXIN) 500 MG tablet  2 times daily     11/10/18 1421    ondansetron (ZOFRAN ODT) 4 MG disintegrating tablet  Every 8 hours PRN     11/10/18 1421           Braeden Dolinski, [place] R, PA-C 11/10/18 1426    Lajean Saver, MD 11/10/18 1442

## 2018-11-10 NOTE — ED Notes (Signed)
Patient transported to CT 

## 2018-11-10 NOTE — ED Triage Notes (Signed)
PT fell while walking up her back steps. PT has bruising to left side and left upper leg. PT struck her head as well. PT reports she has a swollen area on head. PT woke up this morning feeling sick and vomited.

## 2018-12-07 MED FILL — LISINOPRIL 10 MG TABS: 10 | 30 days supply | Qty: 30 | Fill #2

## 2019-01-11 MED FILL — LISINOPRIL 10 MG TABS: 10 | 30 days supply | Qty: 30 | Fill #3

## 2019-02-16 MED FILL — LISINOPRIL 10 MG TABS: 10 | 30 days supply | Qty: 30 | Fill #4

## 2019-03-22 MED FILL — LISINOPRIL 10 MG TABS: 10 | 30 days supply | Qty: 30 | Fill #5 | Status: TO

## 2019-04-30 MED FILL — LISINOPRIL 10 MG TABLET: 10 | 30 days supply | Qty: 30 | Fill #0

## 2019-06-03 MED FILL — LISINOPRIL 10 MG TABLET: 10 | 30 days supply | Qty: 30 | Fill #1

## 2019-07-07 MED FILL — LISINOPRIL 10 MG TABS: 10 | 30 days supply | Qty: 30 | Fill #0

## 2019-08-13 MED FILL — LISINOPRIL 10 MG TABS: 10 | 30 days supply | Qty: 30 | Fill #1

## 2019-09-21 ENCOUNTER — Ambulatory Visit: Payer: No Typology Code available for payment source

## 2020-01-19 ENCOUNTER — Ambulatory Visit: Payer: No Typology Code available for payment source | Attending: Internal Medicine

## 2020-01-19 DIAGNOSIS — Z20822 Contact with and (suspected) exposure to covid-19: Secondary | ICD-10-CM | POA: Insufficient documentation

## 2020-01-20 LAB — NOVEL CORONAVIRUS, NAA: SARS-CoV-2, NAA: NOT DETECTED

## 2020-04-27 ENCOUNTER — Other Ambulatory Visit: Payer: Self-pay

## 2020-04-27 ENCOUNTER — Emergency Department (HOSPITAL_COMMUNITY)
Admission: EM | Admit: 2020-04-27 | Discharge: 2020-04-27 | Disposition: A | Discharge status: Home / Self Care | Payer: Medicaid Other | Attending: Emergency Medicine | Admitting: Emergency Medicine

## 2020-04-27 ENCOUNTER — Encounter (HOSPITAL_COMMUNITY): Payer: Self-pay | Admitting: Emergency Medicine

## 2020-04-27 ENCOUNTER — Emergency Department (HOSPITAL_COMMUNITY): Payer: Medicaid Other

## 2020-04-27 DIAGNOSIS — Z720 Tobacco use: Secondary | ICD-10-CM | POA: Diagnosis not present

## 2020-04-27 DIAGNOSIS — N200 Calculus of kidney: Secondary | ICD-10-CM | POA: Diagnosis not present

## 2020-04-27 DIAGNOSIS — R112 Nausea with vomiting, unspecified: Secondary | ICD-10-CM | POA: Insufficient documentation

## 2020-04-27 DIAGNOSIS — Z79899 Other long term (current) drug therapy: Secondary | ICD-10-CM | POA: Insufficient documentation

## 2020-04-27 DIAGNOSIS — N130 Hydronephrosis with ureteropelvic junction obstruction: Secondary | ICD-10-CM | POA: Insufficient documentation

## 2020-04-27 DIAGNOSIS — R1031 Right lower quadrant pain: Secondary | ICD-10-CM | POA: Diagnosis present

## 2020-04-27 LAB — URINALYSIS, ROUTINE W REFLEX MICROSCOPIC
Bacteria, UA: NONE SEEN
Bilirubin Urine: NEGATIVE
Glucose, UA: 150 mg/dL — AB
Ketones, ur: NEGATIVE mg/dL
Leukocytes,Ua: NEGATIVE
Nitrite: NEGATIVE
Protein, ur: NEGATIVE mg/dL
Specific Gravity, Urine: 1.012 (ref 1.005–1.030)
pH: 7 (ref 5.0–8.0)

## 2020-04-27 LAB — CBC
HCT: 47.5 % — ABNORMAL HIGH (ref 36.0–46.0)
Hemoglobin: 15.4 g/dL — ABNORMAL HIGH (ref 12.0–15.0)
MCH: 29.6 pg (ref 26.0–34.0)
MCHC: 32.4 g/dL (ref 30.0–36.0)
MCV: 91.3 fL (ref 80.0–100.0)
Platelets: 276 10*3/uL (ref 150–400)
RBC: 5.2 MIL/uL — ABNORMAL HIGH (ref 3.87–5.11)
RDW: 13 % (ref 11.5–15.5)
WBC: 15 10*3/uL — ABNORMAL HIGH (ref 4.0–10.5)
nRBC: 0 % (ref 0.0–0.2)

## 2020-04-27 LAB — COMPREHENSIVE METABOLIC PANEL
ALT: 40 U/L (ref 0–44)
AST: 34 U/L (ref 15–41)
Albumin: 3.9 g/dL (ref 3.5–5.0)
Alkaline Phosphatase: 197 U/L — ABNORMAL HIGH (ref 38–126)
Anion gap: 11 (ref 5–15)
BUN: 14 mg/dL (ref 8–23)
CO2: 22 mmol/L (ref 22–32)
Calcium: 8.7 mg/dL — ABNORMAL LOW (ref 8.9–10.3)
Chloride: 104 mmol/L (ref 98–111)
Creatinine, Ser: 0.79 mg/dL (ref 0.44–1.00)
GFR calc Af Amer: >60 mL/min (ref >60)
GFR calc non Af Amer: >60 mL/min (ref >60)
Glucose, Bld: 176 mg/dL — ABNORMAL HIGH (ref 70–99)
Potassium: 3.3 mmol/L — ABNORMAL LOW (ref 3.5–5.1)
Sodium: 137 mmol/L (ref 135–145)
Total Bilirubin: 0.7 mg/dL (ref 0.3–1.2)
Total Protein: 7 g/dL (ref 6.5–8.1)

## 2020-04-27 LAB — LIPASE, BLOOD: Lipase: 34 U/L (ref 11–51)

## 2020-04-27 MED ORDER — ONDANSETRON HCL 4 MG/2ML IJ SOLN
4.0000 mg | Freq: Once | INTRAMUSCULAR | Status: AC
  Administered 2020-04-27: 4 mg via INTRAVENOUS
  Filled 2020-04-27: qty 2

## 2020-04-27 MED ORDER — ONDANSETRON 4 MG PO TBDP: 4.0000 mg | ORAL_TABLET | Freq: Three times a day (TID) | ORAL | 0 refills | Status: DC | PRN

## 2020-04-27 MED ORDER — ACETAMINOPHEN 500 MG PO TABS
1000.0000 mg | ORAL_TABLET | Freq: Once | ORAL | Status: AC
  Administered 2020-04-27: 11:00:00 1000 mg via ORAL
  Filled 2020-04-27: qty 2

## 2020-04-27 MED ORDER — MORPHINE SULFATE 15 MG PO TABS: 7.5000 mg | ORAL_TABLET | ORAL | 0 refills | Status: DC | PRN

## 2020-04-27 MED ORDER — SODIUM CHLORIDE 0.9 % IV BOLUS
1000.0000 mL | Freq: Once | INTRAVENOUS | Status: AC
  Administered 2020-04-27: 1000 mL via INTRAVENOUS

## 2020-04-27 MED ORDER — MORPHINE SULFATE (PF) 4 MG/ML IV SOLN
4.0000 mg | Freq: Once | INTRAVENOUS | Status: AC
  Administered 2020-04-27: 4 mg via INTRAVENOUS
  Filled 2020-04-27: qty 1

## 2020-04-27 MED ORDER — TAMSULOSIN HCL 0.4 MG PO CAPS: 0.4000 mg | ORAL_CAPSULE | Freq: Every day | ORAL | 0 refills | Status: DC

## 2020-04-27 MED ORDER — OXYCODONE HCL 5 MG PO TABS
5.0000 mg | ORAL_TABLET | Freq: Once | ORAL | Status: AC
  Administered 2020-04-27: 5 mg via ORAL
  Filled 2020-04-27: qty 1

## 2020-04-27 MED ORDER — KETOROLAC TROMETHAMINE 30 MG/ML IJ SOLN
15.0000 mg | Freq: Once | INTRAMUSCULAR | Status: AC
  Administered 2020-04-27: 15 mg via INTRAVENOUS
  Filled 2020-04-27: qty 1

## 2020-04-27 NOTE — Discharge Instructions (Signed)
Return for worsening pain, fever or inability to eat or drink  Take 4 over the counter ibuprofen tablets 3 times a day or 2 over-the-counter naproxen tablets twice a day for pain. Also take tylenol 1000mg (2 extra strength) four times a day.   Then take the pain medicine if you feel like you need it. Narcotics do not help with the pain, they only make you care about it less.  You can become addicted to this, people may break into your house to steal it.  It will constipate you.  If you drive under the influence of this medicine you can get a DUI.

## 2020-04-27 NOTE — ED Notes (Signed)
Patient transported to CT 

## 2020-04-27 NOTE — ED Notes (Signed)
Bladder scanner showed 333ml

## 2020-04-27 NOTE — ED Triage Notes (Signed)
Pt arrives via EMS with right flank pain, emesis and vaginal pressure. States she thinks she might have a UTI. EMS gave 50 mcg fentanyl, 500 ml NS and 4 mg zofran.

## 2020-04-27 NOTE — ED Provider Notes (Signed)
[facility] EMERGENCY DEPARTMENT Provider Note   CSN: [id number] Arrival date & time: 04/27/20  [id number]     History Chief Complaint  Patient presents with  . Flank Pain  . Emesis    Catherine Tyler is a 64 y.o. female.  64 yo F with a chief complaints of right lower quadrant abdominal pain.  Started acutely this evening.  States it radiates up into her right flank and she has some feeling like she needs to urinate but cannot.  Has had nausea and vomiting with this.  Pain is sharp and shooting.  Seems to come and go.  Nothing seems to make it better or worse.  No appreciable fevers but has had some chills.  No diarrhea.  No pain like this previously.   The history is provided by the patient.  Flank Pain This is a new problem. The current episode started yesterday. The problem occurs constantly. The problem has not changed since onset.Associated symptoms include abdominal pain. Pertinent negatives include no chest pain, no headaches and no shortness of breath. Nothing aggravates the symptoms. Nothing relieves the symptoms. She has tried nothing for the symptoms. The treatment provided no relief.  Emesis Associated symptoms: abdominal pain and chills   Associated symptoms: no arthralgias, no fever, no headaches and no myalgias        Past Medical History:  Diagnosis Date  . Cancer ([place])   . Personal history of chemotherapy   . Personal history of radiation therapy     Patient Active Problem List   Diagnosis Date Noted  . Hypertension 09/02/2018  . Depression 09/02/2018  . Need for immunization against influenza 09/02/2018  . Healthcare maintenance 09/02/2018  . Dyslipidemia 09/02/2018    Past Surgical History:  Procedure Laterality Date  . BREAST LUMPECTOMY    . BREAST SURGERY       OB History    Gravida  4   Para      Term      Preterm      AB  2   Living  1     SAB      TAB  2   Ectopic      Multiple      Live Births  1             Family History  Problem Relation Age of Onset  . Hyperlipidemia Mother   . Heart failure Mother     Social History   Tobacco Use  . Smoking status: Current Some Day Smoker  . Smokeless tobacco: Never Used  Substance Use Topics  . Alcohol use: Yes    Comment: on occasion  . Drug use: Never    Home Medications Prior to Admission medications   Medication Sig Start Date End Date Taking? Authorizing Provider  lisinopril (PRINIVIL,ZESTRIL) 10 MG tablet Take 1 tablet (10 mg total) by mouth daily. 09/02/18 04/27/20 Yes Lorella Nimrod, MD  loratadine (CLARITIN) 10 MG tablet Take 10 mg by mouth daily.   Yes [provider]  naproxen (NAPROSYN) 375 MG tablet Take 1 tablet (375 mg total) by mouth 2 (two) times daily. 11/10/18  Yes Petrucelli, Samantha R, PA-C  tetrahydrozoline-zinc (VISINE-AC) 0.05-0.25 % ophthalmic solution Place 1 drop into both eyes daily as needed (itching).   Yes [provider]  DULoxetine (CYMBALTA) 30 MG capsule Take 1 capsule (30 mg total) by mouth daily. 09/02/18 09/02/19  Lorella Nimrod, MD  methocarbamol (ROBAXIN) 500 MG tablet Take 1 tablet (500  mg total) by mouth 2 (two) times daily. Patient not taking: Reported on 04/27/2020 11/10/18   Petrucelli, Glynda Jaeger, PA-C  morphine (MSIR) 15 MG tablet Take 0.5 tablets (7.5 mg total) by mouth every 4 (four) hours as needed for severe pain. 04/27/20   Deno Etienne, DO  ondansetron (ZOFRAN ODT) 4 MG disintegrating tablet Take 1 tablet (4 mg total) by mouth every 8 (eight) hours as needed for nausea or vomiting. 04/27/20   Deno Etienne, DO  rosuvastatin (CRESTOR) 5 MG tablet Take 1 tablet (5 mg total) by mouth at bedtime. 09/02/18 09/02/19  Lorella Nimrod, MD  tamsulosin (FLOMAX) 0.4 MG CAPS capsule Take 1 capsule (0.4 mg total) by mouth daily after supper. 04/27/20   Deno Etienne, DO    Allergies    Sulfa antibiotics  Review of Systems   Review of Systems  Constitutional: Positive for chills. Negative for fever.  HENT:  Negative for congestion and rhinorrhea.   Eyes: Negative for redness and visual disturbance.  Respiratory: Negative for shortness of breath and wheezing.   Cardiovascular: Negative for chest pain and palpitations.  Gastrointestinal: Positive for abdominal pain, nausea and vomiting.  Genitourinary: Positive for difficulty urinating and flank pain. Negative for dysuria and urgency.  Musculoskeletal: Negative for arthralgias and myalgias.  Skin: Negative for pallor and wound.  Neurological: Negative for dizziness and headaches.    Physical Exam Updated Vital Signs BP (!) 103/48 (BP Location: Right Arm)   Pulse (!) 109   Temp 98.6 F (37 C) (Oral)   Resp 18   Ht 5\' 5"  (1.651 m)   Wt 77.1 kg   SpO2 94%   BMI 28.29 kg/m   Physical Exam Vitals and nursing note reviewed.  Constitutional:      General: She is not in acute distress.    Appearance: She is well-developed. She is not diaphoretic.     Comments: Appears uncomfortable  HENT:     Head: Normocephalic and atraumatic.  Eyes:     Pupils: Pupils are equal, round, and reactive to light.  Cardiovascular:     Rate and Rhythm: Normal rate and regular rhythm.     Heart sounds: No murmur. No friction rub. No gallop.   Pulmonary:     Effort: Pulmonary effort is normal.     Breath sounds: No wheezing or rales.  Abdominal:     General: There is no distension.     Palpations: Abdomen is soft.     Tenderness: There is no abdominal tenderness.  Musculoskeletal:        General: No tenderness.     Cervical back: Normal range of motion and neck supple.  Skin:    General: Skin is warm and dry.  Neurological:     Mental Status: She is alert and oriented to person, place, and time.  Psychiatric:        Behavior: Behavior normal.     ED Results / Procedures / Treatments   Labs (all labs ordered are listed, but only abnormal results are displayed) Labs Reviewed  COMPREHENSIVE METABOLIC PANEL - Abnormal; Notable for the following  components:      Result Value   Potassium 3.3 (*)    Glucose, Bld 176 (*)    Calcium 8.7 (*)    Alkaline Phosphatase 197 (*)    All other components within normal limits  CBC - Abnormal; Notable for the following components:   WBC 15.0 (*)    RBC 5.20 (*)    Hemoglobin 15.4 (*)  HCT 47.5 (*)    All other components within normal limits  URINALYSIS, ROUTINE W REFLEX MICROSCOPIC - Abnormal; Notable for the following components:   Color, Urine STRAW (*)    Glucose, UA 150 (*)    Hgb urine dipstick SMALL (*)    All other components within normal limits  LIPASE, BLOOD    EKG None  Radiology CT Renal Stone Study  Result Date: 04/27/2020 CLINICAL DATA:  Right flank/right lower quadrant pain EXAM: CT ABDOMEN AND PELVIS WITHOUT CONTRAST TECHNIQUE: Multidetector CT imaging of the abdomen and pelvis was performed following the standard protocol without oral or IV contrast. COMPARISON:  November 10, 2018 FINDINGS: Lower chest: There is mild bibasilar atelectasis. Lung bases otherwise are clear. Old healed rib fractures on the left noted. Benign-appearing calcifications noted in inferior right breast region, stable. Focal hiatal hernia evident. Hepatobiliary: No focal liver lesions are evident on this noncontrast enhanced study. Gallbladder wall is not appreciably thickened. There is no biliary duct dilatation. Pancreas: There is no pancreatic mass or inflammatory focus. Spleen: No splenic lesions are appreciable. Adrenals/Urinary Tract: Right adrenal appears normal. There is stable left adrenal hypertrophy. The right kidney is edematous with mild perinephric fluid. No evident renal mass on either side. There is moderately severe hydronephrosis on the right. There is no hydronephrosis on the left. There is a calculus in the lower pole of the left kidney measuring 4 x 3 mm. There is a second 4 x 3 mm calculus in the upper pole left kidney. There is no intrarenal calculus on the right. There is edema  tracking along the course of the right ureter. There is a 4 x 3 mm calculus at the right ureterovesical junction. No other ureteral calculi are evident. Urinary bladder is midline with wall thickness within normal limits. Stomach/Bowel: There is no appreciable bowel wall or mesenteric thickening. Terminal ileum appears normal. There is lipomatous infiltration of the ileocecal valve. There is no evident bowel obstruction. No free air or portal venous air. Vascular/Lymphatic: There is no appreciable abdominal aortic aneurysm. There is aortic and common iliac artery atherosclerotic calcification bilaterally. There is also bilateral internal iliac artery atherosclerotic calcification. There is no demonstrable adenopathy in the abdomen or pelvis. Reproductive: Uterus absent.  No pelvic mass evident. Other: Appendix appears normal. No evident abscess or ascites in the abdomen or pelvis. Musculoskeletal: There is degenerative change in the lower lumbar spine. There are no blastic or lytic bone lesions. Incomplete visualization of lower rib fractures on the left. 1 No intramuscular or abdominal wall lesions evident. IMPRESSION: 1. Moderately severe hydronephrosis on the right with 4 x 3 mm calculus at the right ureterovesical junction. Right kidney is edematous with mild perinephric fluid. There is also edema tracking along the right ureter. 2.  Nonobstructing calculi in left kidney. 3. No evident bowel obstruction. No abscess in the abdomen or pelvis. Appendix appears normal. 4.  Stable left adrenal hypertrophy.  Right adrenal appears normal. 5.  Uterus absent. 6.  Focal hiatal hernia. 7. Aortic Atherosclerosis (ICD10-I70.0). There also foci of iliac artery and coronary artery calcification.2 Electronically Signed   By: Lowella Grip III M.D.   On: 04/27/2020 10:02    Procedures Procedures (including critical care time)  Medications Ordered in ED Medications  sodium chloride 0.9 % bolus 1,000 mL (0 mLs  Intravenous Stopped 04/27/20 1031)  morphine 4 MG/ML injection 4 mg (4 mg Intravenous Given 04/27/20 0926)  ondansetron (ZOFRAN) injection 4 mg (4 mg Intravenous Given 04/27/20  [id number])  ketorolac (TORADOL) 30 MG/ML injection 15 mg (15 mg Intravenous Given 04/27/20 1028)  acetaminophen (TYLENOL) tablet 1,000 mg (1,000 mg Oral Given 04/27/20 1125)  oxyCODONE (Oxy IR/ROXICODONE) immediate release tablet 5 mg (5 mg Oral Given 04/27/20 1125)    ED Course  I have reviewed the triage vital signs and the nursing notes.  Pertinent labs & imaging results that were available during my care of the patient were reviewed by me and considered in my medical decision making (see chart for details).    MDM Rules/Calculators/A&P                      64 yo F with a chief complaint of right lower quadrant abdominal pain.  Most likely this sounds like a kidney stone as the patient is somewhat writhing on the bed.  No obvious abdominal tenderness on my exam.  Will obtain a stone study reassess.  R sided stone.  72mm.  Pain improved on reassessment though patient still felt like she was not well enough to go home.  Given Toradol and oral pain medicine.  Now feels better.  We will have her follow-up with urology.  12:15 PM:  I have discussed the diagnosis/risks/treatment options with the patient and believe the pt to be eligible for discharge home to follow-up with Urology. We also discussed returning to the ED immediately if new or worsening sx occur. We discussed the sx which are most concerning (e.g., sudden worsening pain, fever, inability to tolerate by mouth) that necessitate immediate return. Medications administered to the patient during their visit and any new prescriptions provided to the patient are listed below.  Medications given during this visit Medications  sodium chloride 0.9 % bolus 1,000 mL (0 mLs Intravenous Stopped 04/27/20 1031)  morphine 4 MG/ML injection 4 mg (4 mg Intravenous Given 04/27/20 0926)    ondansetron (ZOFRAN) injection 4 mg (4 mg Intravenous Given 04/27/20 0926)  ketorolac (TORADOL) 30 MG/ML injection 15 mg (15 mg Intravenous Given 04/27/20 1028)  acetaminophen (TYLENOL) tablet 1,000 mg (1,000 mg Oral Given 04/27/20 1125)  oxyCODONE (Oxy IR/ROXICODONE) immediate release tablet 5 mg (5 mg Oral Given 04/27/20 1125)     The patient appears reasonably screen and/or stabilized for discharge and I doubt any other medical condition or other [facility] requiring further screening, evaluation, or treatment in the ED at this time prior to discharge.     Final Clinical Impression(s) / ED Diagnoses Final diagnoses:  Nephrolithiasis    Rx / DC Orders ED Discharge Orders         Ordered    morphine (MSIR) 15 MG tablet  Every 4 hours PRN     04/27/20 1159    ondansetron (ZOFRAN ODT) 4 MG disintegrating tablet  Every 8 hours PRN     04/27/20 1159    tamsulosin (FLOMAX) 0.4 MG CAPS capsule  Daily after supper     04/27/20 [place], Naomia Lenderman, DO 04/27/20 1215

## 2020-05-30 ENCOUNTER — Other Ambulatory Visit: Payer: Self-pay

## 2020-05-30 ENCOUNTER — Ambulatory Visit (HOSPITAL_COMMUNITY)
Admission: EM | Admit: 2020-05-30 | Discharge: 2020-05-30 | Disposition: A | Discharge status: Home / Self Care | Payer: Self-pay | Attending: Internal Medicine | Admitting: Internal Medicine

## 2020-05-30 ENCOUNTER — Ambulatory Visit (INDEPENDENT_AMBULATORY_CARE_PROVIDER_SITE_OTHER): Payer: Medicaid Other

## 2020-05-30 ENCOUNTER — Encounter (HOSPITAL_COMMUNITY): Payer: Self-pay

## 2020-05-30 DIAGNOSIS — R198 Other specified symptoms and signs involving the digestive system and abdomen: Secondary | ICD-10-CM

## 2020-05-30 DIAGNOSIS — R0789 Other chest pain: Secondary | ICD-10-CM

## 2020-05-30 DIAGNOSIS — G8929 Other chronic pain: Secondary | ICD-10-CM

## 2020-05-30 DIAGNOSIS — Z87448 Personal history of other diseases of urinary system: Secondary | ICD-10-CM

## 2020-05-30 DIAGNOSIS — K5909 Other constipation: Secondary | ICD-10-CM

## 2020-05-30 DIAGNOSIS — N200 Calculus of kidney: Secondary | ICD-10-CM

## 2020-05-30 LAB — POCT URINALYSIS DIP (DEVICE)
Bilirubin Urine: NEGATIVE
Glucose, UA: NEGATIVE mg/dL
Hgb urine dipstick: NEGATIVE
Ketones, ur: NEGATIVE mg/dL
Leukocytes,Ua: NEGATIVE
Nitrite: NEGATIVE
Protein, ur: NEGATIVE mg/dL
Specific Gravity, Urine: 1.015 (ref 1.005–1.030)
Urobilinogen, UA: 0.2 mg/dL (ref 0.0–1.0)
pH: 6 (ref 5.0–8.0)

## 2020-05-30 MED ORDER — DOCUSATE SODIUM 100 MG PO CAPS
100.0000 mg | ORAL_CAPSULE | Freq: Two times a day (BID) | ORAL | 0 refills | Status: DC
Start: 2020-05-30 — End: 2020-06-16

## 2020-05-30 MED ORDER — FLEET ENEMA 7-19 GM/118ML RE ENEM
1.0000 | ENEMA | Freq: Every day | RECTAL | 0 refills | Status: DC | PRN
Start: 2020-05-30 — End: 2020-06-16

## 2020-05-30 NOTE — Discharge Instructions (Addendum)
Please contact [facility] to set up a consult with an urologist for your kidney stones.  You can also contact [facility] to try and establish care with a new PCP for more work-up and management.  If your pain worsens significantly then please report to the emergency room as you will end up needing a CT scan again.   For moderate to severe constipation (not having a bowel movement in more than 3 days) then try to use Miralax or an enema once daily until you have a good bowel movement.  It is not a good idea to use laxatives regularly, for instance daily.  A medication you could use daily to help with promoting bowel movements is docusate (Colace) 50mg -100mg . It is okay to use this 1-2 times daily as needed as a stool softener.  Try to stay active physically including regular exercise 2-3 times a week.  Make sure you hydrate well every day with about 64 ounces of water daily (that is 2 liters).  Try to avoid carb heavy foods, dairy. This includes cutting out breads, pasta, pizza, pastries, potatoes, rice, starchy foods in general. Eat more fiber as listed below:  Salads - kale, spinach, cabbage, spring mix; use seeds like pumpkin seeds or sunflower seeds, almonds; you can also use 1-2 hard boiled eggs in your salads Fruits - avocadoes, berries (blueberries, raspberries, blackberries), apples, oranges, pomegranate, grapefruit Vegetables - aspargus, cauliflower, broccoli, green beans, brussel spouts, bell peppers; stay away from starchy vegetables like potatoes, carrots, peas  Do not eat any foods on this list that you are allergic to.

## 2020-05-30 NOTE — ED Provider Notes (Signed)
[place]   MRN: [id number] DOB: May 18, 1956  Subjective:   Catherine Tyler is a 64 y.o. female presenting for several month history of persistent intermittent chest pressure, belly pressure, difficulty urinating, worsening trouble with constipation and back pain.  Patient states that ever since she ended up going through chemo and radiation for breast cancer, her digestive system changed in for this particular episode she has been using laxatives regularly without good relief.  She does have intermittent bowel movements but are very small.  Patient did become very ill at the end of April, was seen in the emergency room and found to have 3 kidney stones between the left and the right kidneys, with severe hydronephrosis in the ureter and right kidney.  CT also found that she had degenerative disc disease at the lumbar region.  Since then, patient has been able to urinate, does this about 3 or 4 times daily.  She believes that she passed 1 kidney stone.  However, she never saw an urologist.  Does not have a PCP.  Denies history of heart disease, heart attacks.  Denies using opioids.  No current facility-administered medications for this encounter.  Current Outpatient Medications:    DULoxetine (CYMBALTA) 30 MG capsule, Take 1 capsule (30 mg total) by mouth daily., Disp: 30 capsule, Rfl: 2   lisinopril (PRINIVIL,ZESTRIL) 10 MG tablet, Take 1 tablet (10 mg total) by mouth daily., Disp: 30 tablet, Rfl: 11   loratadine (CLARITIN) 10 MG tablet, Take 10 mg by mouth daily., Disp: , Rfl:    methocarbamol (ROBAXIN) 500 MG tablet, Take 1 tablet (500 mg total) by mouth 2 (two) times daily. (Patient not taking: Reported on 04/27/2020), Disp: 20 tablet, Rfl: 0   morphine (MSIR) 15 MG tablet, Take 0.5 tablets (7.5 mg total) by mouth every 4 (four) hours as needed for severe pain., Disp: 5 tablet, Rfl: 0   naproxen (NAPROSYN) 375 MG tablet, Take 1 tablet (375 mg total) by mouth 2 (two) times daily.,  Disp: 20 tablet, Rfl: 0   ondansetron (ZOFRAN ODT) 4 MG disintegrating tablet, Take 1 tablet (4 mg total) by mouth every 8 (eight) hours as needed for nausea or vomiting., Disp: 20 tablet, Rfl: 0   rosuvastatin (CRESTOR) 5 MG tablet, Take 1 tablet (5 mg total) by mouth at bedtime., Disp: 30 tablet, Rfl: 11   tamsulosin (FLOMAX) 0.4 MG CAPS capsule, Take 1 capsule (0.4 mg total) by mouth daily after supper., Disp: 30 capsule, Rfl: 0   tetrahydrozoline-zinc (VISINE-AC) 0.05-0.25 % ophthalmic solution, Place 1 drop into both eyes daily as needed (itching)., Disp: , Rfl:    Allergies  Allergen Reactions   Sulfa Antibiotics Nausea And Vomiting    Past Medical History:  Diagnosis Date   Cancer ([place])    Personal history of chemotherapy    Personal history of radiation therapy      Past Surgical History:  Procedure Laterality Date   BREAST LUMPECTOMY     BREAST SURGERY      Family History  Problem Relation Age of Onset   Hyperlipidemia Mother    Heart failure Mother     Social History   Tobacco Use   Smoking status: Current Some Day Smoker   Smokeless tobacco: Never Used  Substance Use Topics   Alcohol use: Yes    Comment: on occasion   Drug use: Never    ROS   Objective:   Vitals: BP (!) 155/89 (BP Location: Left Arm)    Pulse 93  Temp 98.9 F (37.2 C) (Oral)    Resp (!) 22    SpO2 96%   Physical Exam Constitutional:      General: She is not in acute distress.    Appearance: Normal appearance. She is well-developed and normal weight. She is not ill-appearing, toxic-appearing or diaphoretic.  HENT:     Head: Normocephalic and atraumatic.     Right Ear: External ear normal.     Left Ear: External ear normal.     Nose: Nose normal.     Mouth/Throat:     Mouth: Mucous membranes are moist.     Pharynx: Oropharynx is clear.  Eyes:     General: No scleral icterus.    Extraocular Movements: Extraocular movements intact.     Pupils: Pupils are equal,  round, and reactive to light.  Cardiovascular:     Rate and Rhythm: Normal rate and regular rhythm.     Pulses: Normal pulses.     Heart sounds: Normal heart sounds. No murmur. No friction rub. No gallop.   Pulmonary:     Effort: Pulmonary effort is normal. No respiratory distress.     Breath sounds: Normal breath sounds. No stridor. No wheezing, rhonchi or rales.  Abdominal:     General: Bowel sounds are normal. There is no distension.     Palpations: Abdomen is soft. There is no mass.     Tenderness: There is no abdominal tenderness. There is left CVA tenderness (mild). There is no right CVA tenderness, guarding or rebound.  Skin:    General: Skin is warm and dry.     Coloration: Skin is not pale.     Findings: No rash.  Neurological:     General: No focal deficit present.     Mental Status: She is alert and oriented to person, place, and time.  Psychiatric:        Mood and Affect: Mood normal.        Behavior: Behavior normal.        Thought Content: Thought content normal.        Judgment: Judgment normal.     Results for orders placed or performed during the hospital encounter of 05/30/20 (from the past 24 hour(s))  POCT urinalysis dip (device)     Status: None   Collection Time: 05/30/20  2:02 PM  Result Value Ref Range   Glucose, UA NEGATIVE NEGATIVE mg/dL   Bilirubin Urine NEGATIVE NEGATIVE   Ketones, ur NEGATIVE NEGATIVE mg/dL   Specific Gravity, Urine 1.015 1.005 - 1.030   Hgb urine dipstick NEGATIVE NEGATIVE   pH 6.0 5.0 - 8.0   Protein, ur NEGATIVE NEGATIVE mg/dL   Urobilinogen, UA 0.2 0.0 - 1.0 mg/dL   Nitrite NEGATIVE NEGATIVE   Leukocytes,Ua NEGATIVE NEGATIVE    DG Abd 1 View  Result Date: 05/30/2020 CLINICAL DATA:  Constipation.  Abdominal pain.  Breast cancer. EXAM: ABDOMEN - 1 VIEW COMPARISON:  04/27/2020 CT stone study. FINDINGS: Colonic stool burden suggests constipation. Non-obstructive bowel gas pattern. The diminutive left renal calculus is not  readily apparent. There are pelvic calcifications which are primarily felt to represent phleboliths. A right pelvic calcification could represent a phlebolith or the right-sided bladder stone. IMPRESSION: Possible constipation.  No acute findings. Electronically Signed   By: Abigail Miyamoto M.D.   On: 05/30/2020 14:56     ED ECG REPORT   Date: 05/30/2020  Rate: 83bpm  Rhythm: normal sinus rhythm  QRS Axis: normal  Intervals: normal  ST/T Wave abnormalities: normal  Conduction Disutrbances:none  Narrative Interpretation: Sinus rhythm at 83 bpm with nonspecific T wave flattening in lead aVL and V2.  No previous EKG for comparison.  Old EKG Reviewed: none available  I have personally reviewed the EKG tracing and agree with the computerized printout as noted.   CLINICAL DATA:  Right flank/right lower quadrant pain  EXAM: CT ABDOMEN AND PELVIS WITHOUT CONTRAST  TECHNIQUE: Multidetector CT imaging of the abdomen and pelvis was performed following the standard protocol without oral or IV contrast.  COMPARISON:  November 10, 2018  FINDINGS: Lower chest: There is mild bibasilar atelectasis. Lung bases otherwise are clear. Old healed rib fractures on the left noted. Benign-appearing calcifications noted in inferior right breast region, stable. Focal hiatal hernia evident.  Hepatobiliary: No focal liver lesions are evident on this noncontrast enhanced study. Gallbladder wall is not appreciably thickened. There is no biliary duct dilatation.  Pancreas: There is no pancreatic mass or inflammatory focus.  Spleen: No splenic lesions are appreciable.  Adrenals/Urinary Tract: Right adrenal appears normal. There is stable left adrenal hypertrophy.  The right kidney is edematous with mild perinephric fluid. No evident renal mass on either side. There is moderately severe hydronephrosis on the right. There is no hydronephrosis on the left. There is a calculus in the lower pole of  the left kidney measuring 4 x 3 mm. There is a second 4 x 3 mm calculus in the upper pole left kidney. There is no intrarenal calculus on the right. There is edema tracking along the course of the right ureter. There is a 4 x 3 mm calculus at the right ureterovesical junction. No other ureteral calculi are evident. Urinary bladder is midline with wall thickness within normal limits.  Stomach/Bowel: There is no appreciable bowel wall or mesenteric thickening. Terminal ileum appears normal. There is lipomatous infiltration of the ileocecal valve. There is no evident bowel obstruction. No free air or portal venous air.  Vascular/Lymphatic: There is no appreciable abdominal aortic aneurysm. There is aortic and common iliac artery atherosclerotic calcification bilaterally. There is also bilateral internal iliac artery atherosclerotic calcification. There is no demonstrable adenopathy in the abdomen or pelvis.  Reproductive: Uterus absent.  No pelvic mass evident.  Other: Appendix appears normal. No evident abscess or ascites in the abdomen or pelvis.  Musculoskeletal: There is degenerative change in the lower lumbar spine. There are no blastic or lytic bone lesions. Incomplete visualization of lower rib fractures on the left. 1 No intramuscular or abdominal wall lesions evident.  IMPRESSION: 1. Moderately severe hydronephrosis on the right with 4 x 3 mm calculus at the right ureterovesical junction. Right kidney is edematous with mild perinephric fluid. There is also edema tracking along the right ureter.  2.  Nonobstructing calculi in left kidney.  3. No evident bowel obstruction. No abscess in the abdomen or pelvis. Appendix appears normal.  4.  Stable left adrenal hypertrophy.  Right adrenal appears normal.  5.  Uterus absent.  6.  Focal hiatal hernia.  7. Aortic Atherosclerosis (ICD10-I70.0). There also foci of iliac artery and coronary artery  calcification.2   Electronically Signed   By: Lowella Grip III M.D.   On: 04/27/2020 10:02   Assessment and Plan :   PDMP not reviewed this encounter.  1. Chest pressure   2. Abdominal fullness   3. Chronic low back pain without sciatica, unspecified back pain laterality   4. Chronic constipation   5. Renal stones   6.  History of hydronephrosis     Patient has multiple contributing factors.  Recommended she try an enema, stool softener to help with her diffuse constipation.  Also recommended she reach out to [facility], Dr. Louis Meckel in particular for consult on her renal stones.  Provide her with information for PCP through [facility] internal medicine. Counseled patient on potential for adverse effects with medications prescribed/recommended today, strict ER and return-to-clinic precautions discussed, patient verbalized understanding.    Jaynee Eagles, [place] 05/30/20 [phone]

## 2020-05-30 NOTE — ED Triage Notes (Addendum)
C/o chest pressure, back pain, abdominal pressure, inability to urinate, and constipation. Reports chest pressure and back pain has been going on for a few months. Has been constipated x1 wk; taking laxative with no success. Reports she wasn't able to urinate on Friday.

## 2020-06-02 ENCOUNTER — Ambulatory Visit: Payer: Medicaid Other | Admitting: Internal Medicine

## 2020-06-02 DIAGNOSIS — K5903 Drug induced constipation: Secondary | ICD-10-CM | POA: Diagnosis present

## 2020-06-02 MED ORDER — PSYLLIUM 49 % PO PACK: 1.0000 | PACK | Freq: Two times a day (BID) | ORAL | 1 refills | Status: DC | PRN

## 2020-06-02 MED ORDER — DULCOLAX 5 MG PO TBEC: 10.0000 mg | DELAYED_RELEASE_TABLET | Freq: Every day | ORAL | 0 refills | Status: DC | PRN

## 2020-06-02 MED ORDER — POLYETHYLENE GLYCOL 3350 17 G PO PACK: 34.0000 g | PACK | Freq: Every day | ORAL | 2 refills | Status: DC

## 2020-06-02 NOTE — Assessment & Plan Note (Addendum)
Reports constipation since a month ago, after she received fentanyl and morphine in hospital for nephrolithiasis.  He has as needed morphine at home since then but endorses that she never used that. Her constipation got worse since last week, she was seen in urgent care 4 days ago for that. X ray was obtained and did not show evidence of perforation or obstruction. Also has a Ct abdomen from last 4/29 w/o obstruction. She used 4 FLLET enema and also Colace that made her having a little BM but not that much.  No N/V. No abdominal pain but just has some pressure.   She has good appetite but feel can not eat because she has no bowel movement.  And sometimes has issue with urination because of pressure effect and she has to move to be able to urinate. Never had colonoscopy. She reports some weight loss recently.  -She is aware that opiate can cause constipation and endorses that she did not take any since last month. -PEG 2x17 gr pocket BID PRN -Psyllium -Bisacodyl 10 gr BID PRN -Although her constipation could be initiated by in hospital opiate treatment, due to sudden onset and no improvement, weight loss and history of malignancy and not having a colonoscopy ever, will consider colonoscopy to rule of underlying malignancy. -Provided info and list of required documents for orange card -GI referral when gets orange card -F/u in clinic next week if no improvement -Counseled for smoke cessation -Recommending coloncocopy (She prefers to see Dr.Marc Magog)

## 2020-06-02 NOTE — Progress Notes (Signed)
   CC: Constipation  HPI:  Catherine Tyler is a 64 y.o. female with remote history of cervical cancer (secondary to DES exposure in mother per pt), remote history of breast cancer, constipation, nephrolithiasis, presented with worsening of constipation. Please refer to problem based charting for further details and assessment and plan of current problem and chronic medical conditions.  Past Medical History:  Diagnosis Date  . Cancer ([place])   . Personal history of chemotherapy   . Personal history of radiation therapy    Medication list: Colace,  lisinopril, morphine (did not take it for April 29th)  , Zofran, Fleet enema  Review of Systems:   Negative for abdominal pain Negative for diarrhea Positive for constipation Negative for nausea or vomiting Positive for weight loss   Physical Exam:  Vitals:   06/02/20 1028  BP: 136/81  Pulse: 85  Temp: 98.1 F (36.7 C)  TempSrc: Oral  SpO2: 98%  Weight: 136 lb 13 oz (62.1 kg)  Height: 5\' 5"  (1.651 m)   Constitutional: Well-developed and well-nourished. No acute distress. Mildly uncomfortable due to constipation Head and neck: Normocephalic and atraumatic, no cervical or supraclavicular or axillary lymphadenopathy Eyes: Conjunctivae are normal, EOM nl Cardiovascular:  RRR, nl S1S2, no murmur,  no LEE Respiratory: Effort normal and breath sounds normal. No respiratory distress. No wheezes.  GI: Soft. Bowel sounds are present and normal in all quadrants. No distension. There is no tenderness except mild discomfort in upper quadrants with deep palpation. Neurological: Is alert and oriented x 3  Skin: Not diaphoretic. No erythema.  Psychiatric: Normal mood and affect. Behavior is normal. Judgment and thought content normal.   Assessment & Plan:   See Encounters Tab for problem based charting.  Patient discussed with Dr. Lynnae January

## 2020-06-02 NOTE — Addendum Note (Signed)
Addended by: Larey Dresser A on: 06/02/2020 01:39 PM   Modules accepted: Level of Service

## 2020-06-02 NOTE — Patient Instructions (Signed)
It was our pleasure taking care of you in our clinic today.  You were seen due to constipation. I prescribe stool softener: Psyllium, PEG and his bisacodyl suppository. Take them as instructed. Drink plenty of water. Please come back to clinic if worsening of symptoms in the next few days.  As always, if having severe symptoms, such as abdominal pain, nausea, vomiting, please seek medical attention at emergency room. Follow-up an appointment with financial counselor to start the process of applying for orange card.  Thank you

## 2020-06-02 NOTE — Progress Notes (Signed)
Internal Medicine Clinic Attending  Case discussed with Dr. Myrtie Hawk at the time of the visit.  We reviewed the resident's history and exam and pertinent patient test results.  I agree with the assessment, diagnosis, and plan of care documented in the resident's note.   Her constipation is an undiagnosed new problem with uncertain prognosis as when combined with her weight loss, malignancy of the colorectal tract is definitely a concern and will require further work-up.  Additionally, she has no insurance so her social determinants of health are limiting our work-up of her condition.

## 2020-06-15 ENCOUNTER — Emergency Department (HOSPITAL_COMMUNITY): Payer: Medicaid Other

## 2020-06-15 ENCOUNTER — Other Ambulatory Visit: Payer: Self-pay

## 2020-06-15 ENCOUNTER — Emergency Department (HOSPITAL_COMMUNITY)
Admission: EM | Admit: 2020-06-15 | Discharge: 2020-06-15 | Disposition: A | Discharge status: Home / Self Care | Payer: Medicaid Other | Attending: Emergency Medicine | Admitting: Emergency Medicine

## 2020-06-15 DIAGNOSIS — Z79899 Other long term (current) drug therapy: Secondary | ICD-10-CM | POA: Insufficient documentation

## 2020-06-15 DIAGNOSIS — I10 Essential (primary) hypertension: Secondary | ICD-10-CM | POA: Diagnosis not present

## 2020-06-15 DIAGNOSIS — R0789 Other chest pain: Secondary | ICD-10-CM | POA: Insufficient documentation

## 2020-06-15 DIAGNOSIS — Z853 Personal history of malignant neoplasm of breast: Secondary | ICD-10-CM | POA: Diagnosis not present

## 2020-06-15 DIAGNOSIS — R079 Chest pain, unspecified: Secondary | ICD-10-CM

## 2020-06-15 DIAGNOSIS — R1084 Generalized abdominal pain: Secondary | ICD-10-CM | POA: Diagnosis not present

## 2020-06-15 DIAGNOSIS — Z720 Tobacco use: Secondary | ICD-10-CM | POA: Diagnosis not present

## 2020-06-15 DIAGNOSIS — M546 Pain in thoracic spine: Secondary | ICD-10-CM | POA: Diagnosis not present

## 2020-06-15 LAB — BASIC METABOLIC PANEL
Anion gap: 9 (ref 5–15)
BUN: 8 mg/dL (ref 8–23)
CO2: 23 mmol/L (ref 22–32)
Calcium: 9.3 mg/dL (ref 8.9–10.3)
Chloride: 105 mmol/L (ref 98–111)
Creatinine, Ser: 0.62 mg/dL (ref 0.44–1.00)
GFR calc Af Amer: >60 mL/min (ref >60)
GFR calc non Af Amer: >60 mL/min (ref >60)
Glucose, Bld: 128 mg/dL — ABNORMAL HIGH (ref 70–99)
Potassium: 3.9 mmol/L (ref 3.5–5.1)
Sodium: 137 mmol/L (ref 135–145)

## 2020-06-15 LAB — CBC
HCT: 47.3 % — ABNORMAL HIGH (ref 36.0–46.0)
Hemoglobin: 15.4 g/dL — ABNORMAL HIGH (ref 12.0–15.0)
MCH: 29.3 pg (ref 26.0–34.0)
MCHC: 32.6 g/dL (ref 30.0–36.0)
MCV: 89.9 fL (ref 80.0–100.0)
Platelets: 271 10*3/uL (ref 150–400)
RBC: 5.26 MIL/uL — ABNORMAL HIGH (ref 3.87–5.11)
RDW: 12.4 % (ref 11.5–15.5)
WBC: 7.2 10*3/uL (ref 4.0–10.5)
nRBC: 0 % (ref 0.0–0.2)

## 2020-06-15 LAB — TROPONIN I (HIGH SENSITIVITY)
Troponin I (High Sensitivity): <2 ng/L (ref <18)
Troponin I (High Sensitivity): 4 ng/L (ref <18)

## 2020-06-15 LAB — LIPASE, BLOOD: Lipase: 23 U/L (ref 11–51)

## 2020-06-15 IMAGING — CT CT ABD-PELV W/ CM
2 of 5 series · 9 of 46 positions shown, 10 images · IV contrast (omnipaque)
Comparison: CT renal colic [DATE], CT abdomen pelvis [DATE]
COMPARISON: CT renal colic [DATE], CT abdomen pelvis [DATE]

Addendum:
CLINICAL DATA: Breast cancer with constipation and bone pain

EXAM:
CT CHEST, ABDOMEN, AND PELVIS WITH CONTRAST
TECHNIQUE: Multidetector CT imaging of the chest, abdomen and pelvis was
performed following the standard protocol during bolus
administration of intravenous contrast.
CONTRAST:  100mL OMNIPAQUE IOHEXOL 300 MG/ML  SOLN

[Series 3: cap with · axial · 0.86mm/px · z∈[-494,+56]mm · 6 of 133 slices shown, 7 images]
[im 12/133  soft-tissue]
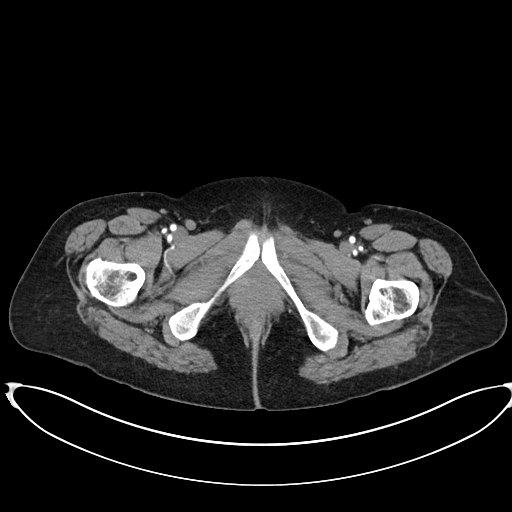
[im 12/133  bone]
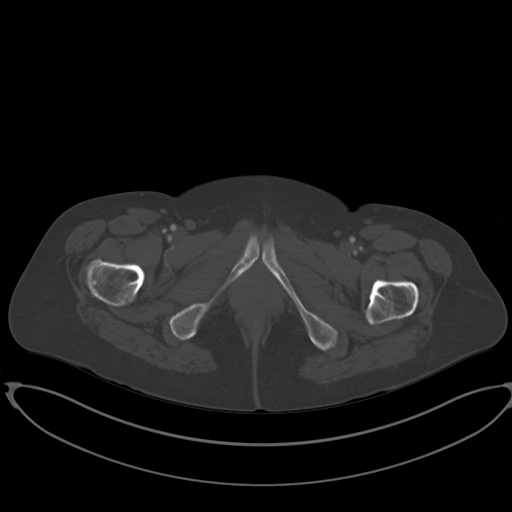
[im 34/133  soft-tissue]
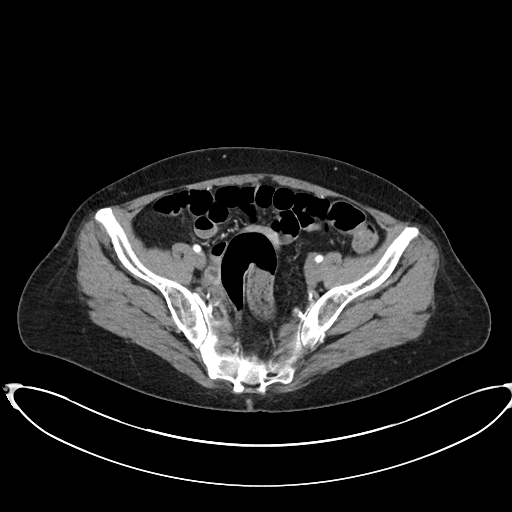
[im 56/133  soft-tissue]
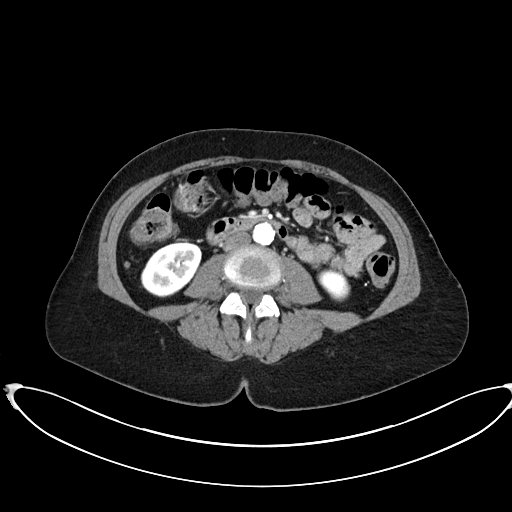
[im 78/133  soft-tissue]
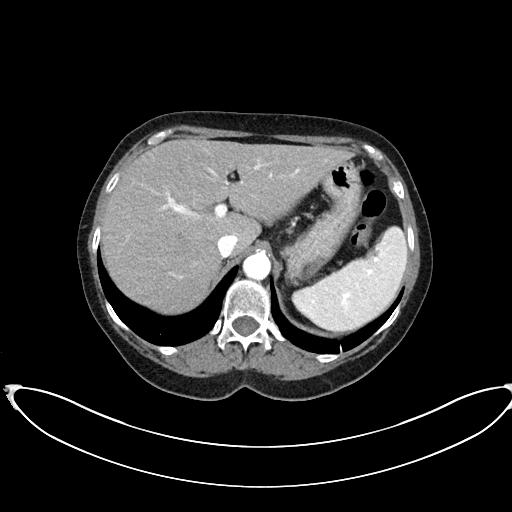
[im 100/133  soft-tissue]
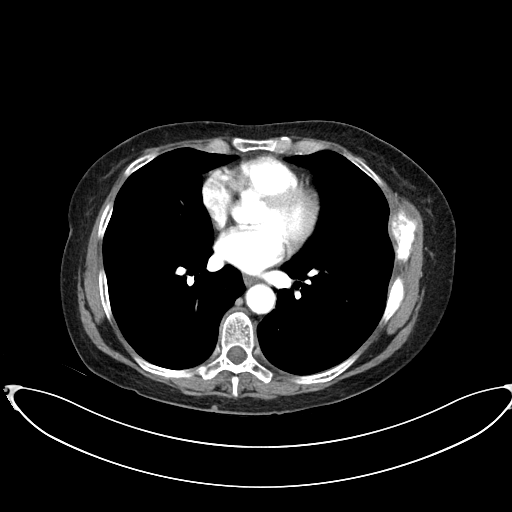
[im 122/133  soft-tissue]
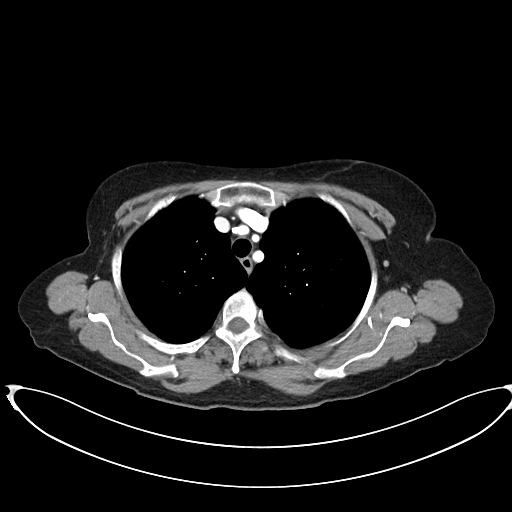

[Series 6: cor · coronal · 0.82mm/px · 3 of 94 slices shown]
[im 32/94  soft-tissue]
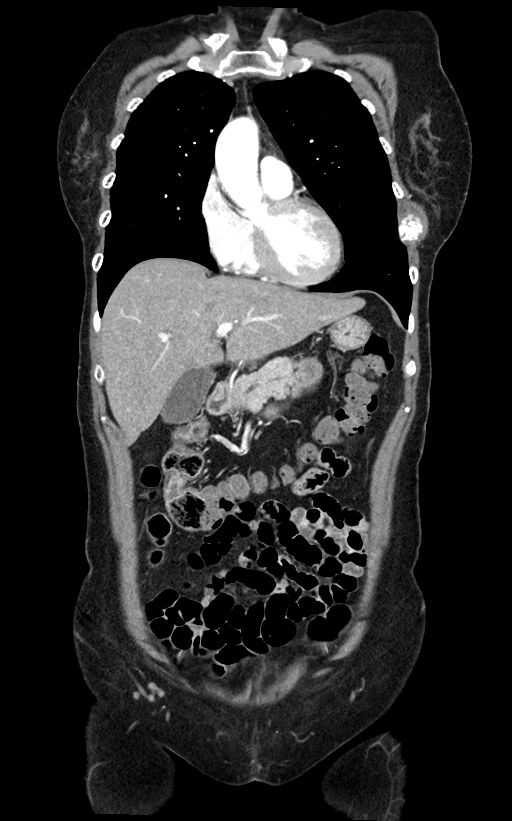
[im 42/94  soft-tissue]
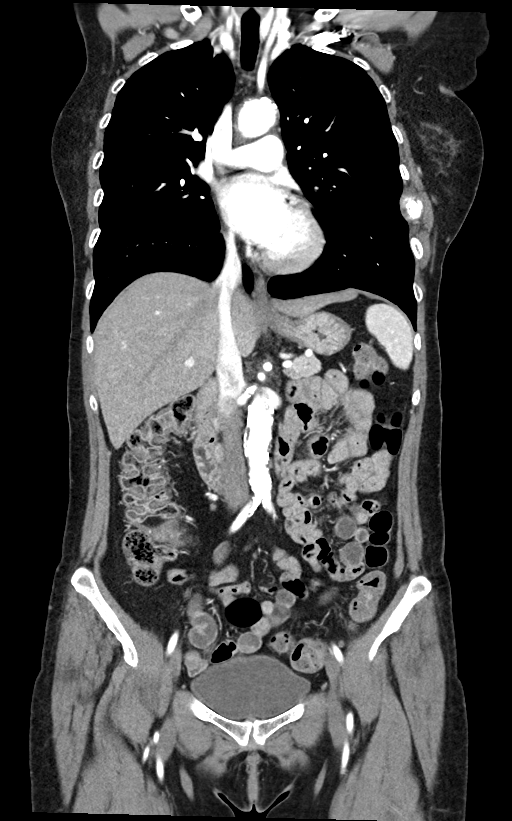
[im 52/94  soft-tissue]
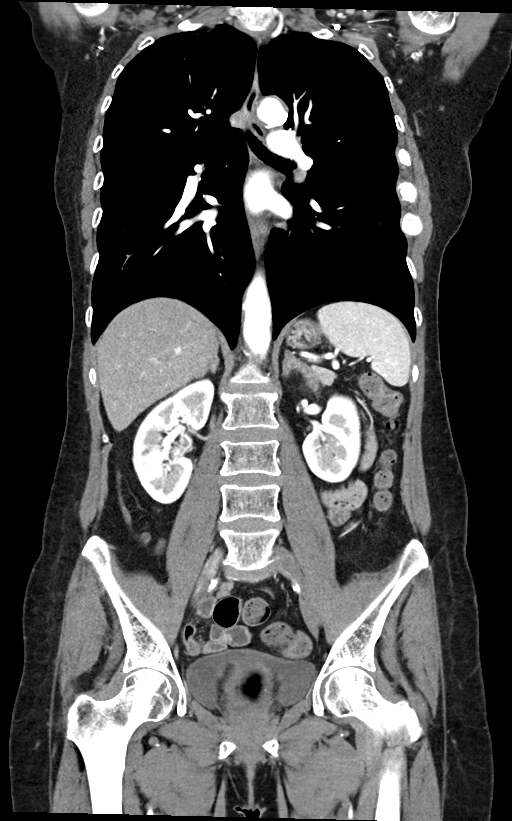

[9 of 46 positions shown; findings below may reference images not displayed]

FINDINGS: CT CHEST FINDINGS

Cardiovascular: Normal heart size. No pericardial effusion.
Atherosclerotic plaque within the normal caliber aorta. Atheromatous
plaque at the origins of the great vessels which branch normally
from the aortic arch. Proximal great vessels are otherwise
unremarkable. Central pulmonary arteries are normal caliber. No
large central or lobar filling defects on this non tailored
examination of the pulmonary arteries.

Mediastinum/Nodes: No mediastinal fluid or gas. Normal thyroid gland
and thoracic inlet. No acute abnormality of the trachea or
esophagus. No worrisome mediastinal, hilar or axillary adenopathy.

Lungs/Pleura: Subpleural reticular changes along the right anterior
upper and middle lobes may be related to prior radiation therapy
some dependent atelectatic changes are seen posteriorly. Biapical
pleuroparenchymal scarring. Some paraseptal emphysematous change
versus bullous disease noted in the bilateral apices. No
consolidative opacities, convincing features of edema, pneumothorax
or effusion. Solid 3 mm nodule seen in the superior segment left
lower lobe (5/73) solid 5 mm nodule in the right apex ([DATE]).
Ill-defined ground-glass opacities present in the left upper lobe
measuring up to 7 mm ([DATE]) and 6 mm ([DATE]) in size.

Musculoskeletal: There are blastic appearing lesions involving the
T4, T5, T6 vertebral bodies with extension into the posterior
elements, smaller lesion at the T3 level as well. There are mixed
sclerotic lesions throughout multiple thoracic ribs including a more
focally sclerotic and expansile lesions with periosteal thickening
and possible soft tissue involvement at the left posterior fifth,
left anterior seventh and more diffusely throughout the left sixth
rib. Few macro calcifications seen in the right breast as well as a
more stippled calcification within a small 10 mm breast mass at the
9 o'clock position.

CT ABDOMEN PELVIS FINDINGS

Hepatobiliary: Diffuse hepatic hypoattenuation compatible with
hepatic steatosis. No focal liver abnormality is seen. No
gallstones, gallbladder wall thickening, or biliary dilatation.

Pancreas: Unremarkable. No pancreatic ductal dilatation or
surrounding inflammatory changes.

Spleen: Normal in size without focal abnormality.

Adrenals/Urinary Tract: Lobular thickening of the adrenal glands
similar to comparison. Previously characterized as probable
hyperplasia. Kidneys enhance and excrete symmetrically. Few punctate
nonobstructing calculi present in both kidneys. No obstructive
urolithiasis or hydronephrosis. Urinary bladder is unremarkable.

Stomach/Bowel: Distal esophagus, stomach and duodenal sweep are
unremarkable. No small bowel wall thickening or dilatation. Mild
distal small bowel fecalization without evidence of obstruction. A
normal appendix is visualized. No colonic dilatation or wall
thickening.

Vascular/Lymphatic: Atherosclerotic calcifications throughout the
abdominal aorta and branch vessels. No aneurysm or ectasia. No
enlarged abdominopelvic lymph nodes. Some irregular fusiform ectasia
of the infrarenal abdominal aorta but with maximal diameter of
cm.

Reproductive: Uterus is surgically absent. No concerning adnexal
lesions. No concerning adnexal lesions.

Other: No abdominopelvic free fluid or free gas. No bowel containing
hernias.

Musculoskeletal: Sclerotic involvement of the right sacral ala
compatible with additional site of bony metastatic disease. No other
convincing sites of
IMPRESSION: 1. Multifocal predominantly blastic appearing osseous metastatic
disease involving the thoracic and lumbar spine, multiple ribs most
prominently at the left fifth through seventh ribs, and the right
sacral ala. Consider outpatient bone scan for further evaluation and
detection additional metastatic foci.
2. Mixed solid nodules and ill-defined ground-glass opacities in the
left upper lobe measuring up to 7 mm and 6 mm in size, nonspecific
possibly reflecting infectious or inflammatory change, primary lung
process, or metastatic disease. Given the patient's history of known
primary malignancy, typical [HOSPITAL] criteria guidelines
do not apply. Consider three-month interval follow-up to establish
stability or assess for interval growth.
3. Mild distal small bowel fecalization without evidence of
obstruction. Correlate for slowed intestinal transit. No evidence of
bowel obstruction or large colonic stool burden.
4. Hepatic steatosis.
5. Lobular thickening of the adrenal glands similar to comparison,
Previously characterized as probable hyperplasia.
6. Bilateral nonobstructing nephrolithiasis.
7. Aortic Atherosclerosis ([P7]-[P7]).

ADDENDUM:
Additional impression point:

Nodule in the right breast with punctate calcifications, recommend
correlation with recent mammography or further outpatient
mammographic assessment if not recently performed.

*** End of Addendum ***
FINDINGS: CT CHEST FINDINGS

Cardiovascular: Normal heart size. No pericardial effusion.
Atherosclerotic plaque within the normal caliber aorta. Atheromatous
plaque at the origins of the great vessels which branch normally
from the aortic arch. Proximal great vessels are otherwise
unremarkable. Central pulmonary arteries are normal caliber. No
large central or lobar filling defects on this non tailored
examination of the pulmonary arteries.

Mediastinum/Nodes: No mediastinal fluid or gas. Normal thyroid gland
and thoracic inlet. No acute abnormality of the trachea or
esophagus. No worrisome mediastinal, hilar or axillary adenopathy.

Lungs/Pleura: Subpleural reticular changes along the right anterior
upper and middle lobes may be related to prior radiation therapy
some dependent atelectatic changes are seen posteriorly. Biapical
pleuroparenchymal scarring. Some paraseptal emphysematous change
versus bullous disease noted in the bilateral apices. No
consolidative opacities, convincing features of edema, pneumothorax
or effusion. Solid 3 mm nodule seen in the superior segment left
lower lobe (5/73) solid 5 mm nodule in the right apex ([DATE]).
Ill-defined ground-glass opacities present in the left upper lobe
measuring up to 7 mm ([DATE]) and 6 mm ([DATE]) in size.

Musculoskeletal: There are blastic appearing lesions involving the
T4, T5, T6 vertebral bodies with extension into the posterior
elements, smaller lesion at the T3 level as well. There are mixed
sclerotic lesions throughout multiple thoracic ribs including a more
focally sclerotic and expansile lesions with periosteal thickening
and possible soft tissue involvement at the left posterior fifth,
left anterior seventh and more diffusely throughout the left sixth
rib. Few macro calcifications seen in the right breast as well as a
more stippled calcification within a small 10 mm breast mass at the
9 o'clock position.

CT ABDOMEN PELVIS FINDINGS

Hepatobiliary: Diffuse hepatic hypoattenuation compatible with
hepatic steatosis. No focal liver abnormality is seen. No
gallstones, gallbladder wall thickening, or biliary dilatation.

Pancreas: Unremarkable. No pancreatic ductal dilatation or
surrounding inflammatory changes.

Spleen: Normal in size without focal abnormality.

Adrenals/Urinary Tract: Lobular thickening of the adrenal glands
similar to comparison. Previously characterized as probable
hyperplasia. Kidneys enhance and excrete symmetrically. Few punctate
nonobstructing calculi present in both kidneys. No obstructive
urolithiasis or hydronephrosis. Urinary bladder is unremarkable.

Stomach/Bowel: Distal esophagus, stomach and duodenal sweep are
unremarkable. No small bowel wall thickening or dilatation. Mild
distal small bowel fecalization without evidence of obstruction. A
normal appendix is visualized. No colonic dilatation or wall
thickening.

Vascular/Lymphatic: Atherosclerotic calcifications throughout the
abdominal aorta and branch vessels. No aneurysm or ectasia. No
enlarged abdominopelvic lymph nodes. Some irregular fusiform ectasia
of the infrarenal abdominal aorta but with maximal diameter of
cm.

Reproductive: Uterus is surgically absent. No concerning adnexal
lesions. No concerning adnexal lesions.

Other: No abdominopelvic free fluid or free gas. No bowel containing
hernias.

Musculoskeletal: Sclerotic involvement of the right sacral ala
compatible with additional site of bony metastatic disease. No other
convincing sites of
IMPRESSION: 1. Multifocal predominantly blastic appearing osseous metastatic
disease involving the thoracic and lumbar spine, multiple ribs most
prominently at the left fifth through seventh ribs, and the right
sacral ala. Consider outpatient bone scan for further evaluation and
detection additional metastatic foci.
2. Mixed solid nodules and ill-defined ground-glass opacities in the
left upper lobe measuring up to 7 mm and 6 mm in size, nonspecific
possibly reflecting infectious or inflammatory change, primary lung
process, or metastatic disease. Given the patient's history of known
primary malignancy, typical [HOSPITAL] criteria guidelines
do not apply. Consider three-month interval follow-up to establish
stability or assess for interval growth.
3. Mild distal small bowel fecalization without evidence of
obstruction. Correlate for slowed intestinal transit. No evidence of
bowel obstruction or large colonic stool burden.
4. Hepatic steatosis.
5. Lobular thickening of the adrenal glands similar to comparison,
Previously characterized as probable hyperplasia.
6. Bilateral nonobstructing nephrolithiasis.
7. Aortic Atherosclerosis ([P7]-[P7]).

## 2020-06-15 MED ORDER — OXYCODONE-ACETAMINOPHEN 5-325 MG PO TABS: 1.0000 | ORAL_TABLET | Freq: Four times a day (QID) | ORAL | 0 refills | Status: DC | PRN

## 2020-06-15 MED ORDER — MORPHINE SULFATE (PF) 4 MG/ML IV SOLN
4.0000 mg | Freq: Once | INTRAVENOUS | Status: AC
  Administered 2020-06-15: 4 mg via INTRAVENOUS
  Filled 2020-06-15: qty 1

## 2020-06-15 MED ORDER — IOHEXOL 300 MG/ML  SOLN
100.0000 mL | Freq: Once | INTRAMUSCULAR | Status: AC | PRN
  Administered 2020-06-15: 100 mL via INTRAVENOUS

## 2020-06-15 NOTE — ED Triage Notes (Signed)
Pt here for eval of several months of back, abdominal, and chest pain. Reports having a (unspecificed) "blockage" for months, and pain has not resolved. Worsening since last night.

## 2020-06-15 NOTE — Discharge Instructions (Addendum)
As discussed, your CT scans showed spots on your vertebra and lungs that are concerning for spread of your cancer. Someone from the cancer center is going to call you tomorrow to schedule an appointment. I am sending you home with pain medication. Take as prescribed. I have included the doctor's number just in case you do not hear from them tomorrow, call to schedule an appointment. Return to the ER for new or worsening symptoms.

## 2020-06-15 NOTE — ED Provider Notes (Signed)
[facility] EMERGENCY DEPARTMENT Provider Note   CSN: [id number] Arrival date & time: 06/15/20  4970     History Chief Complaint  Patient presents with  . Chest Pain  . Abdominal Pain  . Back Pain    Catherine Tyler is a 64 y.o. female.  The history is provided by the patient and a relative. No language interpreter was used.  Chest Pain Associated symptoms: abdominal pain and back pain   Abdominal Pain Associated symptoms: chest pain   Back Pain Associated symptoms: abdominal pain and chest pain      64 year old female with remote history of breast cancer status post chemo and radiation presenting with multiple complaints.  Patient developed pain to upper back and chest yesterday morning which has since radiates down towards her abdomen.  Pain is described as a stabbing tightness and burning sensation not relieved with such, he denies.  Pain is moderate in severity.  Pain seems to be worsening with movement and improves when she lays flat.  She did endorse some generalized fatigue.  Also complaining of having recurrent constipation for the past few months.  She also endorsed approximately 10 pounds of weight loss the past 2 weeks even though she has normal appetite.  There is a history of tobacco use.  She also reports she broke her left ribs in January from simply leaning against something.  Her sister passed away from lung cancer in the past due to his diagnosis.  She has not had a primary care doctor yet.  She is currently not taking any medication for symptom.  No fever, chills, night sweats, headache, hemoptysis.  Patient has not had her Covid vaccination.      Past Medical History:  Diagnosis Date  . Cancer ([place])   . Personal history of chemotherapy   . Personal history of radiation therapy     Patient Active Problem List   Diagnosis Date Noted  . Constipation due to pain medication 06/02/2020  . Hypertension 09/02/2018  . Depression 09/02/2018  .  Need for immunization against influenza 09/02/2018  . Healthcare maintenance 09/02/2018  . Dyslipidemia 09/02/2018    Past Surgical History:  Procedure Laterality Date  . BREAST LUMPECTOMY    . BREAST SURGERY       OB History    Gravida  4   Para      Term      Preterm      AB  2   Living  1     SAB      TAB  2   Ectopic      Multiple      Live Births  1           Family History  Problem Relation Age of Onset  . Hyperlipidemia Mother   . Heart failure Mother     Social History   Tobacco Use  . Smoking status: Current Some Day Smoker  . Smokeless tobacco: Never Used  Vaping Use  . Vaping Use: Never used  Substance Use Topics  . Alcohol use: Yes    Comment: on occasion  . Drug use: Never    Home Medications Prior to Admission medications   Medication Sig Start Date End Date Taking? Authorizing Provider  bisacodyl (DULCOLAX) 5 MG EC tablet Take 2 tablets (10 mg total) by mouth daily as needed for moderate constipation. 06/02/20 06/02/21  Masoudi, Dorthula Rue, MD  docusate sodium (COLACE) 100 MG capsule Take 1 capsule (100 mg total)  by mouth every 12 (twelve) hours. 05/30/20   Jaynee Eagles, PA-C  lisinopril (PRINIVIL,ZESTRIL) 10 MG tablet Take 1 tablet (10 mg total) by mouth daily. 09/02/18 04/27/20  Lorella Nimrod, MD  loratadine (CLARITIN) 10 MG tablet Take 10 mg by mouth daily.    [provider]  morphine (MSIR) 15 MG tablet Take 0.5 tablets (7.5 mg total) by mouth every 4 (four) hours as needed for severe pain. 04/27/20   Deno Etienne, DO  naproxen (NAPROSYN) 375 MG tablet Take 1 tablet (375 mg total) by mouth 2 (two) times daily. 11/10/18   Petrucelli, Samantha R, PA-C  ondansetron (ZOFRAN ODT) 4 MG disintegrating tablet Take 1 tablet (4 mg total) by mouth every 8 (eight) hours as needed for nausea or vomiting. 04/27/20   Deno Etienne, DO  polyethylene glycol (MIRALAX) 17 g packet Take 34 g by mouth daily. 06/02/20   Masoudi, Elhamalsadat, MD  Psyllium  49 % PACK Take 1 packet by mouth 2 (two) times daily as needed. 06/02/20   Masoudi, Elhamalsadat, MD  rosuvastatin (CRESTOR) 5 MG tablet Take 1 tablet (5 mg total) by mouth at bedtime. 09/02/18 09/02/19  Lorella Nimrod, MD  sodium phosphate (FLEET) 7-19 GM/118ML ENEM Place 133 mLs (1 enema total) rectally daily as needed for severe constipation. 05/30/20   Jaynee Eagles, PA-C  tetrahydrozoline-zinc (VISINE-AC) 0.05-0.25 % ophthalmic solution Place 1 drop into both eyes daily as needed (itching).    [provider]    Allergies    Sulfa antibiotics  Review of Systems   Review of Systems  Cardiovascular: Positive for chest pain.  Gastrointestinal: Positive for abdominal pain.  Musculoskeletal: Positive for back pain.  All other systems reviewed and are negative.   Physical Exam Updated Vital Signs BP (!) 145/76   Pulse 71   Temp 98 F (36.7 C) (Oral)   Resp 18   Ht 5\' 5"  (1.651 m)   Wt 61.7 kg   SpO2 99%   BMI 22.63 kg/m   Physical Exam Vitals and nursing note reviewed.  Constitutional:      General: She is not in acute distress.    Appearance: She is well-developed.  HENT:     Head: Atraumatic.  Eyes:     Conjunctiva/sclera: Conjunctivae normal.  Cardiovascular:     Rate and Rhythm: Normal rate and regular rhythm.     Pulses: Normal pulses.     Heart sounds: Normal heart sounds.  Pulmonary:     Effort: Pulmonary effort is normal.     Breath sounds: Normal breath sounds. No wheezing or rales.  Chest:     Chest wall: Tenderness (Mild tenderness to left anterior chest wall.) present.  Abdominal:     General: Abdomen is flat.     Palpations: Abdomen is soft.     Tenderness: There is no abdominal tenderness.  Musculoskeletal:        General: No swelling.     Cervical back: Neck supple.  Skin:    Findings: No rash.  Neurological:     Mental Status: She is alert and oriented to person, place, and time.  Psychiatric:        Mood and Affect: Mood normal.     ED  Results / Procedures / Treatments   Labs (all labs ordered are listed, but only abnormal results are displayed) Labs Reviewed  BASIC METABOLIC PANEL - Abnormal; Notable for the following components:      Result Value   Glucose, Bld 128 (*)  All other components within normal limits  CBC - Abnormal; Notable for the following components:   RBC 5.26 (*)    Hemoglobin 15.4 (*)    HCT 47.3 (*)    All other components within normal limits  LIPASE, BLOOD  POC OCCULT BLOOD, ED  TROPONIN I (HIGH SENSITIVITY)  TROPONIN I (HIGH SENSITIVITY)    EKG EKG Interpretation  Date/Time:  Thursday June 15 2020 09:57:17 EDT Ventricular Rate:  99 PR Interval:  122 QRS Duration: 88 QT Interval:  354 QTC Calculation: 454 R Axis:   76 Text Interpretation: Normal sinus rhythm Possible Left atrial enlargement Borderline ECG Confirmed by Madalyn Rob [phone]) on 06/15/2020 3:53:35 PM   Radiology DG Chest 2 View  Result Date: 06/15/2020 CLINICAL DATA:  Chest pain.  Back pain. EXAM: CHEST - 2 VIEW COMPARISON:  None. FINDINGS: Long segment segment of sclerosis of the posterior lateral LEFT fifth rib. Within the midportion of the sclerosis there is callus formation indicating healed fracture. Additional healed fractures in lower LEFT ribs. Normal cardiac silhouette.  No effusion, infiltrate or pneumothorax. IMPRESSION: 1. Concern for bone metastasis with pathologic fracture of the LEFT fifth rib. Recommend CT thorax with contrast to evaluate for sclerotic skeletal metastasis and other evidence of malignancy. 2. No acute cardiopulmonary findings. Electronically Signed   By: Suzy Bouchard M.D.   On: 06/15/2020 10:43    Procedures Procedures (including critical care time)  Medications Ordered in ED Medications  morphine 4 MG/ML injection 4 mg (4 mg Intravenous Given 06/15/20 1640)  iohexol (OMNIPAQUE) 300 MG/ML solution 100 mL (100 mLs Intravenous Contrast Given 06/15/20 1826)    ED Course  I have  reviewed the triage vital signs and the nursing notes.  Pertinent labs & imaging results that were available during my care of the patient were reviewed by me and considered in my medical decision making (see chart for details).    MDM Rules/Calculators/A&P                          BP (!) 162/85   Pulse 73   Temp 98 F (36.7 C) (Oral)   Resp 19   Ht 5\' 5"  (1.651 m)   Wt 61.7 kg   SpO2 99%   BMI 22.63 kg/m   Final Clinical Impression(s) / ED Diagnoses Final diagnoses:  None    Rx / DC Orders ED Discharge Orders    None     3:54 PM Patient with prior history of breast cancer here with pain to back, chest, abdomen outs abnormal weight loss and overall generalized fatigue.  Pain is atypical of ACS.  Initial EKG unremarkable.  Due to concern for potential malignancy with her constipation and plethora problem will obtain chest abdomen pelvis CT scan to assess for potential malignancy causing her symptoms.  Care discussed with Dr. Roslynn Amble.   6:46 PM HEART score of 2, low risk of MACE.  Pt sign out to oncoming provider who will f/u on CT result and will reassess pt.  Pt received 4mg  of morphine initially with some improvement but her pain has return.  Will provide additional pain meds.  Rectal exam without stool impaction.     Domenic Moras, PA-C 06/15/20 1909    Lucrezia Starch, MD 06/16/20 [phone]

## 2020-06-15 NOTE — ED Provider Notes (Signed)
Care assumed from Domenic Moras, PA-C at shift change pending CT images.  See his note for full HPI.  In short, patient is a 64 year old female with a remote history of breast cancer status post chemotherapy and radiation who presents to the ED due to diffuse back, chest, and abdominal pain that has been ongoing for the past few months.  She admits to reoccurring constipation over the past few months.  She also admits to a 10 pound weight loss in the past 2 weeks.  Plan from previous provider: If CT images are unremarkable, patient may be discharged home with PCP follow-up. Physical Exam  BP (!) 148/90   Pulse 72   Temp 98 F (36.7 C) (Oral)   Resp 19   Ht 5\' 5"  (1.651 m)   Wt 61.7 kg   SpO2 97%   BMI 22.63 kg/m   Physical Exam Vitals and nursing note reviewed.  Constitutional:      General: She is not in acute distress.    Appearance: She is not ill-appearing.  HENT:     Head: Normocephalic.  Eyes:     Pupils: Pupils are equal, round, and reactive to light.  Cardiovascular:     Rate and Rhythm: Normal rate and regular rhythm.     Pulses: Normal pulses.     Heart sounds: Normal heart sounds. No murmur heard.  No friction rub. No gallop.   Pulmonary:     Effort: Pulmonary effort is normal.     Breath sounds: Normal breath sounds.  Chest:     Comments: Anterior chest wall tenderness.  No crepitus or deformity. Abdominal:     General: Abdomen is flat. Bowel sounds are normal. There is no distension.     Palpations: Abdomen is soft.     Tenderness: There is no abdominal tenderness. There is no guarding or rebound.  Musculoskeletal:     Cervical back: Neck supple.     Comments: Able to move all 4 extremities without difficulty.  Skin:    General: Skin is warm and dry.  Neurological:     General: No focal deficit present.     Mental Status: She is alert.  Psychiatric:        Mood and Affect: Mood normal.        Behavior: Behavior normal.     ED Course/Procedures    Clinical Course as of Jun 15 2034  Thu Jun 15, 2020  2033 Discussed case with Dr. Benay Spice with oncology who will call patient tomorrow to schedule follow-up at the cancer center.    [CA]    Clinical Course User Index [CA] Suzy Bouchard, PA-C    Procedures  MDM  Care assumed from Domenic Moras, PA-C at shift change pending CT images.  See his note for full MDM.  64 year old female presents to the ED due to diffuse chest, back, abdominal pain.  Patient has a history of breast cancer.  I personally reviewed all labs and imaging from previous provider work-up.  Delta troponin flat.  Doubt ACS.  CBC reassuring with no leukocytosis.  Lipase normal at 23.  Doubt pancreatitis.  BMP reassuring with normal renal function and no major electrolyte derangements. CXR personally reviewed which demonstrates: IMPRESSION:  1. Concern for bone metastasis with pathologic fracture of the LEFT  fifth rib. Recommend CT thorax with contrast to evaluate for  sclerotic skeletal metastasis and other evidence of malignancy.  2. No acute cardiopulmonary findings.   EKG personally reviewed which demonstrates normal  sinus rhythm no signs of acute ischemia.  CT scans personally reviewed which demonstrates: IMPRESSION:  1. 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 Consider outpatient bone scan for further evaluation and  detection additional metastatic foci.  2. Mixed solid nodules and ill-defined ground-glass opacities in the  left upper lobe measuring up to 7 mm and 6 mm in size, nonspecific  possibly reflecting infectious or inflammatory change, primary lung  process, or metastatic disease. Given the patient's history of known  primary malignancy, typical Fleischner Society criteria guidelines  do not apply. Consider three-month  interval follow-up to establish  stability or assess for interval growth.  3. Mild distal small bowel fecalization without evidence of  obstruction. Correlate for slowed intestinal transit. No evidence of  bowel obstruction or large colonic stool burden.  4. Hepatic steatosis.  5. Lobular thickening of the adrenal glands similar to comparison,  Previously characterized as probable hyperplasia.  6. Bilateral nonobstructing nephrolithiasis.  7. Aortic Atherosclerosis (ICD10-I70.0).   Discussed case with Dr. Benay Spice with oncology. See note above. No concern for cauda equina or central cord compression. Patient able to ambulate here in the ED without difficulty. Will discharge patient with pain medication. Cancer center number given to patient. Strict ED precautions discussed with patient. Patient states understanding and agrees to plan. Patient discharged home in no acute distress and stable vitals.      Karie Kirks 06/15/20 2045    Carmin Muskrat, MD 06/15/20 2236

## 2020-06-16 ENCOUNTER — Other Ambulatory Visit: Payer: Self-pay

## 2020-06-16 ENCOUNTER — Inpatient Hospital Stay (HOSPITAL_BASED_OUTPATIENT_CLINIC_OR_DEPARTMENT_OTHER): Payer: Medicaid Other | Admitting: Oncology

## 2020-06-16 ENCOUNTER — Telehealth: Payer: Self-pay | Admitting: Oncology

## 2020-06-16 ENCOUNTER — Inpatient Hospital Stay: Payer: Medicaid Other | Attending: Oncology

## 2020-06-16 ENCOUNTER — Other Ambulatory Visit: Payer: Self-pay | Admitting: *Deleted

## 2020-06-16 VITALS — BP 122/81 | HR 75 | Temp 98.3°F | Resp 18 | Ht 65.0 in | Wt 159.3 lb

## 2020-06-16 DIAGNOSIS — G893 Neoplasm related pain (acute) (chronic): Secondary | ICD-10-CM

## 2020-06-16 DIAGNOSIS — Z801 Family history of malignant neoplasm of trachea, bronchus and lung: Secondary | ICD-10-CM | POA: Insufficient documentation

## 2020-06-16 DIAGNOSIS — C7951 Secondary malignant neoplasm of bone: Secondary | ICD-10-CM

## 2020-06-16 DIAGNOSIS — R0781 Pleurodynia: Secondary | ICD-10-CM

## 2020-06-16 DIAGNOSIS — F17210 Nicotine dependence, cigarettes, uncomplicated: Secondary | ICD-10-CM | POA: Insufficient documentation

## 2020-06-16 DIAGNOSIS — Z9221 Personal history of antineoplastic chemotherapy: Secondary | ICD-10-CM

## 2020-06-16 DIAGNOSIS — C50811 Malignant neoplasm of overlapping sites of right female breast: Secondary | ICD-10-CM

## 2020-06-16 DIAGNOSIS — Z853 Personal history of malignant neoplasm of breast: Secondary | ICD-10-CM | POA: Insufficient documentation

## 2020-06-16 DIAGNOSIS — K5900 Constipation, unspecified: Secondary | ICD-10-CM | POA: Insufficient documentation

## 2020-06-16 DIAGNOSIS — Z923 Personal history of irradiation: Secondary | ICD-10-CM | POA: Insufficient documentation

## 2020-06-16 DIAGNOSIS — Z170 Estrogen receptor positive status [ER+]: Secondary | ICD-10-CM

## 2020-06-16 MED ORDER — MORPHINE SULFATE 15 MG PO TABS: 7.5000 mg | ORAL_TABLET | Freq: Four times a day (QID) | ORAL | 0 refills | Status: DC | PRN

## 2020-06-16 MED ORDER — NAPROXEN SODIUM 220 MG PO TABS
220.0000 mg | ORAL_TABLET | Freq: Three times a day (TID) | ORAL | 4 refills | Status: DC
Start: 2020-06-16 — End: 2020-09-20

## 2020-06-16 MED ORDER — ACETAMINOPHEN 500 MG PO TABS
500.0000 mg | ORAL_TABLET | Freq: Three times a day (TID) | ORAL | 0 refills | Status: DC
Start: 2020-06-16 — End: 2021-02-09

## 2020-06-16 NOTE — Progress Notes (Signed)
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  From her report, this appears to have been about 3 cm and to have involved 11 of the 13 right axillary lymph nodes involved.  She was treated at [facility] by Dr.  She was treated under Dr Catherine Tyler in [facility] with lumpectomy, chemotherapy (TAC x6) , and radiation therapy.  She then tried Femara which she did not tolerate and no further antiestrogens were prescribed.  She presented to the ED yesterday, 06/15/2020, with diffuse back, chest, and abdominal pain, as well as unintentional weight loss. Chest x-ray performed at that time showed: concern for bone metastasis with pathologic fracture of left 5th rib.  She proceeded to chest, abdomen, pelvis CT that day, which showed: multifocal predominantly blastic-appearing osseous metastatic disease involving the thoracic and lumbar spine, multiple ribs most prominently at the left fifth through seventh ribs, and the right sacral ala; nonspecific mixed solid nodules and ill-defined ground-glass opacities in the left upper lobe measuring up to 7 mm and 6 mm in size; mild distal small bowel fecalization without evidence of obstruction.  Of note, her most recent mammogram on file is a bilateral diagnostic scan from 08/11/2018. At that time, she presented with a burning sensation to the inferior right breast. Mammogram showed: breast density category B; no mammographic evidence of malignancy in the bilateral breasts.   The patient's subsequent history is as detailed below.   INTERVAL HISTORY: Catherine Tyler was  evaluated in the breast cancer clinic on 06/16/2020 accompanied by her sister Catherine Tyler (who incidentally runs her own ministry).    REVIEW OF SYSTEMS: [name] is having pain in the left rib cage area, which was evaluated by her chiropractor several times.  She was thought to possibly have a renal stone so was evaluated by urology.  She is taking Tylenol and took 1/2 Percocet which helped her sleep but also worsened her constipation.  She denies unusual headaches, visual changes, nausea, vomiting, stiff neck, dizziness, or gait imbalance. There has been no cough, phlegm production, or pleurisy and no change in bowel or bladder habits. The patient denies fever, rash, bleeding or unexplained fatigue. A detailed review of systems was otherwise entirely negative.   PAST MEDICAL HISTORY: Past Medical History:  Diagnosis Date   Cancer [facility])    Personal history of chemotherapy    Personal history of radiation therapy     PAST SURGICAL HISTORY: Past Surgical History:  Procedure Laterality Date   BREAST LUMPECTOMY     BREAST SURGERY      FAMILY HISTORY: Family History  Problem Relation Age of Onset   Hyperlipidemia Mother    Heart failure Mother   The patient's father died at age 17 from a myocardial infarction.  As far as the patient knows there is no history of cancer on that side of the family.  The patient's mother died at age 51 from renal failure.  The patient is not aware of any cancer on that side of the family.  The patient herself has 5 sisters, no brothers.  1 sister Catherine Tyler) had lung cancer.  GYNECOLOGIC HISTORY:  No LMP recorded. Patient is postmenopausal. Menarche: 64 years old Age at first live birth: 64 years old [name] P 1 LMP status post hysterectomy at age 49 secondary to fibroids; status post remote conization for in situ cervical carcinoma BSO?    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  She lives by  herself with her dog Catherine Tyler who is a rescue.  The patient has been divorced 13 years as of June 2021.  Her son Catherine Tyler lives in [place] where he works in Architect.  The patient has 1 grandchild.  She attends [employer].  ADVANCED DIRECTIVES: To be discussed   HEALTH MAINTENANCE: Social History   Tobacco Use   Smoking status: Current Some Day Smoker   Smokeless tobacco: Never Used  Vaping Use   Vaping Use: Never used  Substance Use Topics   Alcohol use: Yes    Comment: on occasion   Drug use: Never     Colonoscopy: Never  PAP: 07/2018, negative  Bone density: Never   Allergies  Allergen Reactions   Sulfa Antibiotics Nausea And Vomiting   Tape Other (See Comments)    Takes off skin    Current Outpatient Medications  Medication Sig Dispense Refill   morphine (MSIR) 15 MG tablet Take 0.5 tablets (7.5 mg total) by mouth every 4 (four) hours as needed for severe pain. 5 tablet 0   ondansetron (ZOFRAN ODT) 4 MG disintegrating tablet Take 1 tablet (4 mg total) by mouth every 8 (eight) hours as needed for nausea or vomiting. 20 tablet 0   oxyCODONE-acetaminophen (PERCOCET/ROXICET) 5-325 MG tablet Take 1 tablet by mouth every 6 (six) hours as needed for severe pain. 15 tablet 0   polyethylene glycol (MIRALAX / GLYCOLAX) 17 g packet Take 17 g by mouth daily.     acetaminophen (TYLENOL) 500 MG tablet Take 1 tablet (500 mg total) by mouth with breakfast, with lunch, and with evening meal. Take together with aleve 220 mg 30 tablet 0   bisacodyl (DULCOLAX) 10 MG suppository Place 1 suppository (10 mg total) rectally as needed for moderate constipation. 12 suppository 0   lisinopril (PRINIVIL,ZESTRIL) 10 MG tablet Take 1 tablet (10 mg total) by mouth daily. 30 tablet 11   morphine (MSIR) 15 MG tablet Take 0.5-1 tablets (7.5-15 mg total) by mouth every 6 (six) hours as needed for severe pain. 30 tablet 0   naproxen sodium (ALEVE) 220 MG tablet Take 1 tablet  (220 mg total) by mouth with breakfast, with lunch, and with evening meal. 90 tablet 4   No current facility-administered medications for this visit.    OBJECTIVE: White woman who appears stated age  64:   06/16/20 1535  BP: 122/81  Pulse: 75  Resp: 18  Temp: 98.3 F (36.8 C)  SpO2: 98%     Body mass index is 26.51 kg/m.   Wt Readings from Last 3 Encounters:  06/16/20 159 lb 4.8 oz (72.3 kg)  06/15/20 136 lb (61.7 kg)  06/02/20 136 lb 13 oz (62.1 kg)      ECOG FS:1 - Symptomatic but completely ambulatory  Ocular: Sclerae unicteric, pupils round and equal Ear-nose-throat: Wearing a mask Lymphatic: No cervical or supraclavicular adenopathy Lungs no rales or rhonchi Heart regular rate and rhythm Abd soft, nontender, positive bowel sounds MSK moderate spinal tenderness to palpation Neuro: non-focal, well-oriented, appropriate affect Breasts: The right breast is status  post prior lumpectomy and radiation.  There is no finding to suggest a local recurrence.  The left breast is benign   LAB RESULTS:  CMP     Component Value Date/Time   NA 137 06/15/2020 1004   NA 140 09/02/2018 1106   K 3.9 06/15/2020 1004   CL 105 06/15/2020 1004   CO2 23 06/15/2020 1004   GLUCOSE 128 (H) 06/15/2020 1004   BUN 8 06/15/2020 1004   BUN 22 09/02/2018 1106   CREATININE 0.62 06/15/2020 1004   CALCIUM 9.3 06/15/2020 1004   PROT 7.0 04/27/2020 0809   ALBUMIN 3.9 04/27/2020 0809   AST 34 04/27/2020 0809   ALT 40 04/27/2020 0809   ALKPHOS 197 (H) 04/27/2020 0809   BILITOT 0.7 04/27/2020 0809   GFRNONAA >60 06/15/2020 1004   GFRAA >60 06/15/2020 1004    No results found for: TOTALPROTELP, ALBUMINELP, A1GS, A2GS, BETS, BETA2SER, GAMS, MSPIKE, SPEI  Lab Results  Component Value Date   WBC 7.2 06/15/2020   NEUTROABS 10.6 (H) 11/10/2018   HGB 15.4 (H) 06/15/2020   HCT 47.3 (H) 06/15/2020   MCV 89.9 06/15/2020   PLT 271 06/15/2020    No results found for: LABCA2  No  components found for: [id number]  No results for input(s): INR in the last 168 hours.  No results found for: LABCA2  No results found for: LGX211  No results found for: HER740  No results found for: CXK481  No results found for: CA2729  No components found for: HGQUANT  No results found for: CEA1 / No results found for: CEA1   No results found for: AFPTUMOR  No results found for: CHROMOGRNA  No results found for: KPAFRELGTCHN, LAMBDASER, KAPLAMBRATIO (kappa/lambda light chains)  No results found for: HGBA, HGBA2QUANT, HGBFQUANT, HGBSQUAN (Hemoglobinopathy evaluation)   No results found for: LDH  No results found for: IRON, TIBC, IRONPCTSAT (Iron and TIBC)  No results found for: FERRITIN  Urinalysis    Component Value Date/Time   COLORURINE STRAW (A) 04/27/2020 0928   APPEARANCEUR CLEAR 04/27/2020 0928   LABSPEC 1.015 05/30/2020 1402   PHURINE 6.0 05/30/2020 [place] 05/30/2020 [place] 05/30/2020 1402   BILIRUBINUR NEGATIVE 05/30/2020 1402   KETONESUR NEGATIVE 05/30/2020 1402   PROTEINUR NEGATIVE 05/30/2020 1402   UROBILINOGEN 0.2 05/30/2020 1402   NITRITE NEGATIVE 05/30/2020 [place] 05/30/2020 1402     STUDIES: DG Chest 2 View  Result Date: 06/15/2020 CLINICAL DATA:  Chest pain.  Back pain. EXAM: CHEST - 2 VIEW COMPARISON:  None. FINDINGS: Long segment segment of sclerosis of the posterior lateral LEFT fifth rib. Within the midportion of the sclerosis there is callus formation indicating healed fracture. Additional healed fractures in lower LEFT ribs. Normal cardiac silhouette.  No effusion, infiltrate or pneumothorax. IMPRESSION: 1. Concern for bone metastasis with pathologic fracture of the LEFT fifth rib. Recommend CT thorax with contrast to evaluate for sclerotic skeletal metastasis and other evidence of malignancy. 2. No acute cardiopulmonary findings. Electronically Signed   By: Suzy Bouchard M.D.   On:  06/15/2020 10:43   DG Abd 1 View  Result Date: 05/30/2020 CLINICAL DATA:  Constipation.  Abdominal pain.  Breast cancer. EXAM: ABDOMEN - 1 VIEW COMPARISON:  04/27/2020 CT stone study. FINDINGS: Colonic stool burden suggests constipation. Non-obstructive bowel gas pattern. The diminutive left renal calculus is not readily apparent. There are pelvic calcifications which are primarily felt to represent phleboliths. A right pelvic calcification  could represent a phlebolith or the right-sided bladder stone. IMPRESSION: Possible constipation.  No acute findings. Electronically Signed   By: Abigail Miyamoto M.D.   On: 05/30/2020 14:56   CT Chest W Contrast  Addendum Date: 06/15/2020   ADDENDUM REPORT: 06/15/2020 20:14 ADDENDUM: Additional impression point: Nodule in the right breast with punctate calcifications, recommend correlation with recent mammography or further outpatient mammographic assessment if not recently performed. Electronically Signed   By: Lovena Le M.D.   On: 06/15/2020 20:14   Result Date: 06/15/2020 CLINICAL DATA:  Breast cancer with constipation and bone pain EXAM: CT CHEST, ABDOMEN, AND PELVIS WITH CONTRAST TECHNIQUE: Multidetector CT imaging of the chest, abdomen and pelvis was performed following the standard protocol during bolus administration of intravenous contrast. CONTRAST:  15m OMNIPAQUE IOHEXOL 300 MG/ML  SOLN COMPARISON:  CT renal colic 070/17/7939 CT abdomen pelvis 11/10/2018 FINDINGS: CT CHEST FINDINGS Cardiovascular: Normal heart size. No pericardial effusion. Atherosclerotic plaque within the normal caliber aorta. Atheromatous plaque at the origins of the great vessels which branch normally from the aortic arch. Proximal great vessels are otherwise unremarkable. Central pulmonary arteries are normal caliber. No large central or lobar filling defects on this non tailored examination of the pulmonary arteries. Mediastinum/Nodes: No mediastinal fluid or gas. Normal thyroid gland  and thoracic inlet. No acute abnormality of the trachea or esophagus. No worrisome mediastinal, hilar or axillary adenopathy. Lungs/Pleura: Subpleural reticular changes along the right anterior upper and middle lobes may be related to prior radiation therapy some dependent atelectatic changes are seen posteriorly. Biapical pleuroparenchymal scarring. Some paraseptal emphysematous change versus bullous disease noted in the bilateral apices. No consolidative opacities, convincing features of edema, pneumothorax or effusion. Solid 3 mm nodule seen in the superior segment left lower lobe (5/73) solid 5 mm nodule in the right apex (5/19). Ill-defined ground-glass opacities present in the left upper lobe measuring up to 7 mm (5/31) and 6 mm (5/19) in size. Musculoskeletal: There are blastic appearing lesions involving the T4, T5, T6 vertebral bodies with extension into the posterior elements, smaller lesion at the T3 level as well. There are mixed sclerotic lesions throughout multiple thoracic ribs including a more focally sclerotic and expansile lesions with periosteal thickening and possible soft tissue involvement at the left posterior fifth, left anterior seventh and more diffusely throughout the left sixth rib. Few macro calcifications seen in the right breast as well as a more stippled calcification within a small 10 mm breast mass at the 9 o'clock position. CT ABDOMEN PELVIS FINDINGS Hepatobiliary: Diffuse hepatic hypoattenuation compatible with hepatic steatosis. No focal liver abnormality is seen. No gallstones, gallbladder wall thickening, or biliary dilatation. Pancreas: Unremarkable. No pancreatic ductal dilatation or surrounding inflammatory changes. Spleen: Normal in size without focal abnormality. Adrenals/Urinary Tract: Lobular thickening of the adrenal glands similar to comparison. Previously characterized as probable hyperplasia. Kidneys enhance and excrete symmetrically. Few punctate nonobstructing  calculi present in both kidneys. No obstructive urolithiasis or hydronephrosis. Urinary bladder is unremarkable. Stomach/Bowel: Distal esophagus, stomach and duodenal sweep are unremarkable. No small bowel wall thickening or dilatation. Mild distal small bowel fecalization without evidence of obstruction. A normal appendix is visualized. No colonic dilatation or wall thickening. Vascular/Lymphatic: Atherosclerotic calcifications throughout the abdominal aorta and branch vessels. No aneurysm or ectasia. No enlarged abdominopelvic lymph nodes. Some irregular fusiform ectasia of the infrarenal abdominal aorta but with maximal diameter of 2.1 cm. Reproductive: Uterus is surgically absent. No concerning adnexal lesions. No concerning adnexal lesions. Other: No abdominopelvic free fluid  or free gas. No bowel containing hernias. Musculoskeletal: Sclerotic involvement of the right sacral ala compatible with additional site of bony metastatic disease. No other convincing sites of IMPRESSION: 1. Multifocal predominantly blastic appearing osseous metastatic disease involving the thoracic and lumbar spine, multiple ribs most prominently at the left fifth through seventh ribs, and the right sacral ala. Consider outpatient bone scan for further evaluation and detection additional metastatic foci. 2. Mixed solid nodules and ill-defined ground-glass opacities in the left upper lobe measuring up to 7 mm and 6 mm in size, nonspecific possibly reflecting infectious or inflammatory change, primary lung process, or metastatic disease. Given the patient's history of known primary malignancy, typical Fleischner Society criteria guidelines do not apply. Consider three-month interval follow-up to establish stability or assess for interval growth. 3. Mild distal small bowel fecalization without evidence of obstruction. Correlate for slowed intestinal transit. No evidence of bowel obstruction or large colonic stool burden. 4. Hepatic  steatosis. 5. Lobular thickening of the adrenal glands similar to comparison, Previously characterized as probable hyperplasia. 6. Bilateral nonobstructing nephrolithiasis. 7. Aortic Atherosclerosis (ICD10-I70.0). Electronically Signed: By: Lovena Le M.D. On: 06/15/2020 19:40   CT ABDOMEN PELVIS W CONTRAST  Addendum Date: 06/15/2020   ADDENDUM REPORT: 06/15/2020 20:14 ADDENDUM: Additional impression point: Nodule in the right breast with punctate calcifications, recommend correlation with recent mammography or further outpatient mammographic assessment if not recently performed. Electronically Signed   By: Lovena Le M.D.   On: 06/15/2020 20:14   Result Date: 06/15/2020 CLINICAL DATA:  Breast cancer with constipation and bone pain EXAM: CT CHEST, ABDOMEN, AND PELVIS WITH CONTRAST TECHNIQUE: Multidetector CT imaging of the chest, abdomen and pelvis was performed following the standard protocol during bolus administration of intravenous contrast. CONTRAST:  171m OMNIPAQUE IOHEXOL 300 MG/ML  SOLN COMPARISON:  CT renal colic 023/76/2831 CT abdomen pelvis 11/10/2018 FINDINGS: CT CHEST FINDINGS Cardiovascular: Normal heart size. No pericardial effusion. Atherosclerotic plaque within the normal caliber aorta. Atheromatous plaque at the origins of the great vessels which branch normally from the aortic arch. Proximal great vessels are otherwise unremarkable. Central pulmonary arteries are normal caliber. No large central or lobar filling defects on this non tailored examination of the pulmonary arteries. Mediastinum/Nodes: No mediastinal fluid or gas. Normal thyroid gland and thoracic inlet. No acute abnormality of the trachea or esophagus. No worrisome mediastinal, hilar or axillary adenopathy. Lungs/Pleura: Subpleural reticular changes along the right anterior upper and middle lobes may be related to prior radiation therapy some dependent atelectatic changes are seen posteriorly. Biapical pleuroparenchymal  scarring. Some paraseptal emphysematous change versus bullous disease noted in the bilateral apices. No consolidative opacities, convincing features of edema, pneumothorax or effusion. Solid 3 mm nodule seen in the superior segment left lower lobe (5/73) solid 5 mm nodule in the right apex (5/19). Ill-defined ground-glass opacities present in the left upper lobe measuring up to 7 mm (5/31) and 6 mm (5/19) in size. Musculoskeletal: There are blastic appearing lesions involving the T4, T5, T6 vertebral bodies with extension into the posterior elements, smaller lesion at the T3 level as well. There are mixed sclerotic lesions throughout multiple thoracic ribs including a more focally sclerotic and expansile lesions with periosteal thickening and possible soft tissue involvement at the left posterior fifth, left anterior seventh and more diffusely throughout the left sixth rib. Few macro calcifications seen in the right breast as well as a more stippled calcification within a small 10 mm breast mass at the 9 o'clock position. CT ABDOMEN PELVIS  FINDINGS Hepatobiliary: Diffuse hepatic hypoattenuation compatible with hepatic steatosis. No focal liver abnormality is seen. No gallstones, gallbladder wall thickening, or biliary dilatation. Pancreas: Unremarkable. No pancreatic ductal dilatation or surrounding inflammatory changes. Spleen: Normal in size without focal abnormality. Adrenals/Urinary Tract: Lobular thickening of the adrenal glands similar to comparison. Previously characterized as probable hyperplasia. Kidneys enhance and excrete symmetrically. Few punctate nonobstructing calculi present in both kidneys. No obstructive urolithiasis or hydronephrosis. Urinary bladder is unremarkable. Stomach/Bowel: Distal esophagus, stomach and duodenal sweep are unremarkable. No small bowel wall thickening or dilatation. Mild distal small bowel fecalization without evidence of obstruction. A normal appendix is visualized. No  colonic dilatation or wall thickening. Vascular/Lymphatic: Atherosclerotic calcifications throughout the abdominal aorta and branch vessels. No aneurysm or ectasia. No enlarged abdominopelvic lymph nodes. Some irregular fusiform ectasia of the infrarenal abdominal aorta but with maximal diameter of 2.1 cm. Reproductive: Uterus is surgically absent. No concerning adnexal lesions. No concerning adnexal lesions. Other: No abdominopelvic free fluid or free gas. No bowel containing hernias. Musculoskeletal: Sclerotic involvement of the right sacral ala compatible with additional site of bony metastatic disease. No other convincing sites of IMPRESSION: 1. Multifocal predominantly blastic appearing osseous metastatic disease involving the thoracic and lumbar spine, multiple ribs most prominently at the left fifth through seventh ribs, and the right sacral ala. Consider outpatient bone scan for further evaluation and detection additional metastatic foci. 2. Mixed solid nodules and ill-defined ground-glass opacities in the left upper lobe measuring up to 7 mm and 6 mm in size, nonspecific possibly reflecting infectious or inflammatory change, primary lung process, or metastatic disease. Given the patient's history of known primary malignancy, typical Fleischner Society criteria guidelines do not apply. Consider three-month interval follow-up to establish stability or assess for interval growth. 3. Mild distal small bowel fecalization without evidence of obstruction. Correlate for slowed intestinal transit. No evidence of bowel obstruction or large colonic stool burden. 4. Hepatic steatosis. 5. Lobular thickening of the adrenal glands similar to comparison, Previously characterized as probable hyperplasia. 6. Bilateral nonobstructing nephrolithiasis. 7. Aortic Atherosclerosis (ICD10-I70.0). Electronically Signed: By: Lovena Le M.D. On: 06/15/2020 19:40     ELIGIBLE FOR AVAILABLE RESEARCH PROTOCOL: no  ASSESSMENT: 64  y.o. [place] woman   (1) status post right lumpectomy and axillary lymph node dissection in 2007 for a stage III invasive breast cancer, additional pathology details to be reviewed when available  (a) a total of 13 axillary lymph nodes were removed, 11 positive  (b) adjuvant chemotherapy consisted of docetaxel, doxorubicin and cyclophosphamide x6  (c) the patient received adjuvant radiation  (d) received letrozole briefly but did not tolerate it.  METASTATIC DISEASE (2) CT scans of the chest abdomen and pelvis 06/15/2020 shows widespread bony metastatic disease, no definitive visceral disease  (a) bone marrow biopsy scheduled for 06/20/2020  (b) consider F 18 estradiol PET scan (cerianna) if bone marrow biopsy nondiagnostic  (3) if cancer is estrogen receptor positive, as expected, will start fulvestrant/palbociclib  PLAN: I met today with Catherine Tyler to review her new diagnosis. Specifically we discussed the biology of her breast cancer, its diagnosis, staging, treatment  options and prognosis. She understands that stage IV breast cancer is not curable with our current knowledge base. The goal of treatment is control. The strategy of treatment is to do only the minimum necessary to control the growth of the tumor so that the patient can have as normal a life as possible. There is no survival advantage in treating aggressively if treating less aggressively  results in tumor control. With this strategy stage IV breast cancer in many cases can function as a "chronic illness": something that cannot be quite gotten rid of but can be controlled for an indefinite period of time  Of course at this point we do not know exactly what we are dealing with.  This could be a different cancer,  it could be not cancer, and if it is breast cancer as expected it could be estrogen receptor positive or negative and HER-2 positive or negative.  The definitive treatment will depend on the pathology report.  We have set her  up for bone marrow biopsy 06/20/2020.  If that does not work we will obtain a cerianna scan which would let us know if the bone lesions are estrogen avid or not.  We discussed pain issues and I have asked her to take Aleve and extra strength Tylenol together 3 times a day for baseline.  I also wrote her for [facility] and she may take 1/2 - 1 tablet every 6 h as needed.  For constipation she will take MiraLAX daily, Colace (between 1 and 4 tablets twice daily) and if no bowel movement in day 3 she will use any laxative of her choice.  She tells me she will be going to the beach for a week at the end of this month.  Accordingly we are going to consider starting her treatments assuming we do fulvestrant and palbociclib when she returns from the beach, 1st week in July.  At the next visit we will clarify advanced directives and healthcare power of attorney issues  Total encounter time 70 minutes.Sarajane Jews C. Brynda Heick, MD 06/17/2020 8:24 AM Medical Oncology and Hematology [facility] [place], [place] [address] Tel. [phone]    Fax. [phone]   This document serves as a record of services personally performed by Lurline Del, MD. It was created on his behalf by Wilburn Mylar, a trained medical scribe. The creation of this record is based on the scribe's personal observations and the provider's statements to them.   I, Lurline Del MD, have reviewed the above documentation for accuracy and completeness, and I agree with the above.    *Total Encounter Time as defined by the Centers for Medicare and Medicaid Services includes, in addition to the face-to-face time of a patient visit (documented in the note above) non-face-to-face time: obtaining and reviewing outside history, ordering and reviewing medications, tests or procedures, care coordination (communications with other health care professionals or caregivers) and documentation in the medical record.

## 2020-06-16 NOTE — Telephone Encounter (Signed)
I received a staff msg to schedule Catherine Tyler for a new pt appt w/Dr. Jana Hakim. She has been cld and scheduled to see Dr. Jana Hakim today at 4pm w/labs at 3pm. Pt aware to arrive 15 minutes early.

## 2020-06-17 DIAGNOSIS — C7951 Secondary malignant neoplasm of bone: Secondary | ICD-10-CM | POA: Insufficient documentation

## 2020-06-17 DIAGNOSIS — C50811 Malignant neoplasm of overlapping sites of right female breast: Secondary | ICD-10-CM | POA: Insufficient documentation

## 2020-06-17 DIAGNOSIS — Z170 Estrogen receptor positive status [ER+]: Secondary | ICD-10-CM | POA: Insufficient documentation

## 2020-06-19 ENCOUNTER — Telehealth: Payer: Self-pay | Admitting: Oncology

## 2020-06-19 NOTE — Telephone Encounter (Signed)
Scheduled appts per 6/18 los. Pt confirmed appt dates and times.

## 2020-06-20 ENCOUNTER — Other Ambulatory Visit: Payer: Self-pay

## 2020-06-20 ENCOUNTER — Inpatient Hospital Stay: Payer: Medicaid Other

## 2020-06-20 ENCOUNTER — Other Ambulatory Visit: Payer: Medicaid Other

## 2020-06-20 ENCOUNTER — Inpatient Hospital Stay (HOSPITAL_BASED_OUTPATIENT_CLINIC_OR_DEPARTMENT_OTHER): Payer: Medicaid Other | Admitting: Adult Health

## 2020-06-20 VITALS — BP 130/83 | HR 65 | Temp 98.4°F | Resp 18

## 2020-06-20 DIAGNOSIS — Z170 Estrogen receptor positive status [ER+]: Secondary | ICD-10-CM | POA: Diagnosis not present

## 2020-06-20 DIAGNOSIS — C50811 Malignant neoplasm of overlapping sites of right female breast: Secondary | ICD-10-CM

## 2020-06-20 DIAGNOSIS — C7951 Secondary malignant neoplasm of bone: Secondary | ICD-10-CM

## 2020-06-20 DIAGNOSIS — C50911 Malignant neoplasm of unspecified site of right female breast: Secondary | ICD-10-CM

## 2020-06-20 LAB — CBC WITH DIFFERENTIAL (CANCER CENTER ONLY)
Abs Immature Granulocytes: 0.03 10*3/uL (ref 0.00–0.07)
Basophils Absolute: 0.1 10*3/uL (ref 0.0–0.1)
Basophils Relative: 1 %
Eosinophils Absolute: 0.2 10*3/uL (ref 0.0–0.5)
Eosinophils Relative: 2 %
HCT: 42.8 % (ref 36.0–46.0)
Hemoglobin: 14.3 g/dL (ref 12.0–15.0)
Immature Granulocytes: 0 %
Lymphocytes Relative: 22 %
Lymphs Abs: 1.5 10*3/uL (ref 0.7–4.0)
MCH: 29.8 pg (ref 26.0–34.0)
MCHC: 33.4 g/dL (ref 30.0–36.0)
MCV: 89.2 fL (ref 80.0–100.0)
Monocytes Absolute: 0.5 10*3/uL (ref 0.1–1.0)
Monocytes Relative: 7 %
Neutro Abs: 4.6 10*3/uL (ref 1.7–7.7)
Neutrophils Relative %: 68 %
Platelet Count: 224 10*3/uL (ref 150–400)
RBC: 4.8 MIL/uL (ref 3.87–5.11)
RDW: 12.3 % (ref 11.5–15.5)
WBC Count: 6.9 10*3/uL (ref 4.0–10.5)
nRBC: 0 % (ref 0.0–0.2)

## 2020-06-20 LAB — COMPREHENSIVE METABOLIC PANEL
ALT: 43 U/L (ref 0–44)
AST: 35 U/L (ref 15–41)
Albumin: 3.7 g/dL (ref 3.5–5.0)
Alkaline Phosphatase: 368 U/L — ABNORMAL HIGH (ref 38–126)
Anion gap: 7 (ref 5–15)
BUN: 13 mg/dL (ref 8–23)
CO2: 27 mmol/L (ref 22–32)
Calcium: 9.5 mg/dL (ref 8.9–10.3)
Chloride: 103 mmol/L (ref 98–111)
Creatinine, Ser: 0.74 mg/dL (ref 0.44–1.00)
GFR calc Af Amer: >60 mL/min (ref >60)
GFR calc non Af Amer: >60 mL/min (ref >60)
Glucose, Bld: 107 mg/dL — ABNORMAL HIGH (ref 70–99)
Potassium: 4.5 mmol/L (ref 3.5–5.1)
Sodium: 137 mmol/L (ref 135–145)
Total Bilirubin: 0.5 mg/dL (ref 0.3–1.2)
Total Protein: 6.7 g/dL (ref 6.5–8.1)

## 2020-06-20 LAB — CEA (IN HOUSE-CHCC): CEA (CHCC-In House): 1129.2 ng/mL — ABNORMAL HIGH (ref 0.00–5.00)

## 2020-06-20 NOTE — Progress Notes (Signed)
INDICATION: metastatic breast cancer  Brief examination was performed. ENT: adequate airway clearance Heart: regular rate and rhythm.No Murmurs Lungs: clear to auscultation, no wheezes, normal respiratory effort  Bone Marrow Biopsy and Aspiration Procedure Note   Informed consent was obtained and potential risks including bleeding, infection and pain were reviewed with the patient.  The patient's name, date of birth, identification, consent and allergies were verified prior to the start of procedure and time out was performed.  The right posterior iliac crest was chosen as the site of biopsy.  The skin was prepped with ChloraPrep.   8 cc of 2% lidocaine was used to provide local anaesthesia.   10 cc of bone marrow aspirate was obtained followed by 1cm biopsy.  Pressure was applied to the biopsy site and bandage was placed over the biopsy site. Patient was made to lie on the back for 30 mins prior to discharge.  The procedure was tolerated well. COMPLICATIONS: None BLOOD LOSS: none The patient was discharged home in stable condition with a 1 week follow up to review results.  Patient was provided with post bone marrow biopsy instructions and instructed to call if there was any bleeding or worsening pain.  Specimens sent for flow cytometry, cytogenetics and additional studies.  Signed Scot Dock, NP

## 2020-06-20 NOTE — Patient Instructions (Signed)
Bone Marrow Aspiration and Bone Marrow Biopsy, Adult, Care After This sheet gives you information about how to care for yourself after your procedure. Your health care provider may also give you more specific instructions. If you have problems or questions, contact your health care provider. What can I expect after the procedure? After the procedure, it is common to have:  Mild pain and tenderness.  Swelling.  Bruising. Follow these instructions at home: Puncture site care   Follow instructions from your health care provider about how to take care of the puncture site. Make sure you: ? Wash your hands with soap and water before and after you change your bandage (dressing). If soap and water are not available, use hand sanitizer. ? Change your dressing as told by your health care provider.  Check your puncture site every day for signs of infection. Check for: ? More redness, swelling, or pain. ? Fluid or blood. ? Warmth. ? Pus or a bad smell. Activity  Return to your normal activities as told by your health care provider. Ask your health care provider what activities are safe for you.  Do not lift anything that is heavier than 10 lb (4.5 kg), or the limit that you are told, until your health care provider says that it is safe.  Do not drive for 24 hours if you were given a sedative during your procedure. General instructions   Take over-the-counter and prescription medicines only as told by your health care provider.  Do not take baths, swim, or use a hot tub until your health care provider approves. Ask your health care provider if you may take showers. You may only be allowed to take sponge baths.  If directed, put ice on the affected area. To do this: ? Put ice in a plastic bag. ? Place a towel between your skin and the bag. ? Leave the ice on for 20 minutes, 2-3 times a day.  Keep all follow-up visits as told by your health care provider. This is important. Contact a  health care provider if:  Your pain is not controlled with medicine.  You have a fever.  You have more redness, swelling, or pain around the puncture site.  You have fluid or blood coming from the puncture site.  Your puncture site feels warm to the touch.  You have pus or a bad smell coming from the puncture site. Summary  After the procedure, it is common to have mild pain, tenderness, swelling, and bruising.  Follow instructions from your health care provider about how to take care of the puncture site and what activities are safe for you.  Take over-the-counter and prescription medicines only as told by your health care provider.  Contact a health care provider if you have any signs of infection, such as fluid or blood coming from the puncture site. This information is not intended to replace advice given to you by your health care provider. Make sure you discuss any questions you have with your health care provider. Document Revised: 05/04/2019 Document Reviewed: 05/04/2019 [employer] Patient Education  2020 [employer]  

## 2020-06-20 NOTE — Progress Notes (Signed)
Bone marrow biopsy completed by Wilber Bihari, NP. Patient observed for 30-minutes post-procedure. Vitals retaken and remained stable. Dressing assessed with no signs of bleeding.

## 2020-06-21 ENCOUNTER — Telehealth: Payer: Self-pay | Admitting: Adult Health

## 2020-06-21 LAB — CANCER ANTIGEN 27.29: CA 27.29: 400.7 U/mL — ABNORMAL HIGH (ref 0.0–38.6)

## 2020-06-21 NOTE — Telephone Encounter (Signed)
No 6/22 LOS. No changes made to pt's schedule.

## 2020-06-22 LAB — SURGICAL PATHOLOGY

## 2020-06-27 ENCOUNTER — Encounter (HOSPITAL_COMMUNITY): Payer: Self-pay | Admitting: Oncology

## 2020-06-28 ENCOUNTER — Ambulatory Visit: Payer: Self-pay

## 2020-07-06 NOTE — Progress Notes (Signed)
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 She was evaluated in the breast cancer clinic on 06/16/2020.  Since consultation, she underwent bone barrow biopsy on 06/20/2020 with pathology [id number]) showing: slightly hypercellular bone marrow for age with trilineage hematopoiesis; no evidence of metastatic carcinoma.   REVIEW OF SYSTEMS: Catherine Tyler is taking a half an MSIR tablet 4 times a day and that really is not holding her pain.  She is also doing the Tylenol/Aleve combination as we discussed.  She is a little bit constipated from the narcotics.  She was using Linzess and that was working well for her.  The concern there of course is cost.  She does have MiraLAX and she was taking some Colace but not enough.  She does have variety of options if she has had no bowel movement by day 3.  She is feeling dizzy sometimes and is having headaches.  There have been no changes in vision.  She is currently really not able to work hardly because of fatigue and weakness and partly because of pain.  A detailed review of systems today was otherwise stable   HISTORY OF CURRENT ILLNESS: From the original intake note:  "Catherine Tyler" has a history of right breast cancer, diagnosed in 2007.  From her report, this appears to have been about 3 cm and to have involved 11 of the 13 right axillary lymph nodes involved.  She was treated at [facility] by Dr.  She was treated under Dr Epimenio Foot in [facility] with lumpectomy, chemotherapy (TAC x6) , and radiation therapy.  She then  tried Femara which she did not tolerate and no further antiestrogens were prescribed.  She presented to the ED yesterday, 06/15/2020, with diffuse back, chest, and abdominal pain, as well as unintentional weight loss. Chest x-ray performed at that time showed: concern for bone metastasis with pathologic fracture of left 5th rib.  She proceeded to chest, abdomen, pelvis CT that day, which showed: multifocal predominantly blastic-appearing osseous metastatic disease involving the thoracic and lumbar spine, multiple ribs most prominently at the left fifth through seventh ribs, and the right sacral ala; nonspecific mixed solid nodules and ill-defined ground-glass opacities in the left upper lobe measuring up to 7 mm and 6 mm in size; mild distal small bowel fecalization without evidence of obstruction.  Of note, her most recent mammogram on file is a bilateral diagnostic scan from 08/11/2018. At that time, she presented with a burning sensation to the inferior right breast. Mammogram showed: breast density category B; no mammographic evidence of malignancy in the bilateral breasts.   The patient's subsequent history is as detailed below.   PAST MEDICAL HISTORY: Past Medical History:  Diagnosis Date  . Cancer ([place])   . Personal history of chemotherapy   . Personal history of radiation therapy     PAST SURGICAL HISTORY: Past Surgical History:  Procedure Laterality Date  . BREAST LUMPECTOMY    . BREAST SURGERY      FAMILY HISTORY: Family History  Problem Relation Age of Onset  . Hyperlipidemia Mother   . Heart failure  Mother   The patient's father died at age 3 from a myocardial infarction.  As far as the patient knows there is no history of cancer on that side of the family.  The patient's mother died at age 74 from renal failure.  The patient is not aware of any cancer on that side of the family.  The patient herself has 5 sisters, no brothers.  1 sister [name]) had lung  cancer.   GYNECOLOGIC HISTORY:  No LMP recorded. Patient is postmenopausal. Menarche: 64 years old Age at first live birth: 63 years old [place] P 1 LMP status post hysterectomy at age 79 secondary to fibroids; status post remote conization for in situ cervical carcinoma BSO?    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  She lives by herself with her dog [name] who is a rescue.  The patient has been divorced 13 years as of June 2021.  Her son Gina Leblond lives in [place] where he works in Architect.  The patient has 1 grandchild.  She attends [employer].   ADVANCED DIRECTIVES: To be discussed   HEALTH MAINTENANCE: Social History   Tobacco Use  . Smoking status: Current Some Day Smoker  . Smokeless tobacco: Never Used  Vaping Use  . Vaping Use: Never used  Substance Use Topics  . Alcohol use: Yes    Comment: on occasion  . Drug use: Never     Colonoscopy: Never  PAP: 07/2018, negative  Bone density: Never   Allergies  Allergen Reactions  . Sulfa Antibiotics Nausea And Vomiting  . Tape Other (See Comments)    Takes off skin    Current Outpatient Medications  Medication Sig Dispense Refill  . acetaminophen (TYLENOL) 500 MG tablet Take 1 tablet (500 mg total) by mouth with breakfast, with lunch, and with evening meal. Take together with aleve 220 mg 30 tablet 0  . bisacodyl (DULCOLAX) 10 MG suppository Place 1 suppository (10 mg total) rectally as needed for moderate constipation. 12 suppository 0  . docusate sodium (COLACE) 100 MG capsule Take 2 capsules (200 mg total) by mouth 2 (two) times daily as needed for mild constipation. 80 capsule 0  . lisinopril (PRINIVIL,ZESTRIL) 10 MG tablet Take 1 tablet (10 mg total) by mouth daily. 30 tablet 11  . morphine (MSIR) 15 MG tablet Take 1 tablet (15 mg total) by mouth every 6 (six) hours as needed for severe pain. 60 tablet 0  . naproxen sodium (ALEVE)  220 MG tablet Take 1 tablet (220 mg total) by mouth with breakfast, with lunch, and with evening meal. 90 tablet 4  . ondansetron (ZOFRAN ODT) 4 MG disintegrating tablet Take 1 tablet (4 mg total) by mouth every 8 (eight) hours as needed for nausea or vomiting. 20 tablet 0  . polyethylene glycol (MIRALAX / GLYCOLAX) 17 g packet Take 17 g by mouth daily.     No current facility-administered medications for this visit.    OBJECTIVE: White woman who appears stated age  64:   07/07/20 1354  BP: (!) 147/80  Pulse: 72  Resp: 18  Temp: 98.3 F (36.8 C)  SpO2: 99%     Body mass index is 26.18 kg/m.   Wt Readings from Last 3 Encounters:  07/07/20 157 lb 4.8 oz (71.4 kg)  06/16/20 159 lb 4.8 oz (72.3 kg)  06/15/20 136 lb (61.7 kg)      ECOG FS:1 -  Symptomatic but completely ambulatory  Sclerae unicteric, EOMs intact Wearing a mask No cervical or supraclavicular adenopathy Lungs no rales or rhonchi Heart regular rate and rhythm Abd soft, nontender, positive bowel sounds MSK no focal spinal tenderness, no upper extremity lymphedema Neuro: nonfocal, well oriented, appropriate affect Breasts: The right breast is status post lumpectomy and radiation.  I do not find any suggestion of local recurrence per the left breast on axilla are benign.   LAB RESULTS:  CMP     Component Value Date/Time   NA 137 06/20/2020 0822   NA 140 09/02/2018 1106   K 4.5 06/20/2020 0822   CL 103 06/20/2020 0822   CO2 27 06/20/2020 0822   GLUCOSE 107 (H) 06/20/2020 0822   BUN 13 06/20/2020 0822   BUN 22 09/02/2018 1106   CREATININE 0.74 06/20/2020 0822   CALCIUM 9.5 06/20/2020 0822   PROT 6.7 06/20/2020 0822   ALBUMIN 3.7 06/20/2020 0822   AST 35 06/20/2020 0822   ALT 43 06/20/2020 0822   ALKPHOS 368 (H) 06/20/2020 0822   BILITOT 0.5 06/20/2020 0822   GFRNONAA >60 06/20/2020 0822   GFRAA >60 06/20/2020 0822    No results found for: TOTALPROTELP, ALBUMINELP, A1GS, A2GS, BETS, BETA2SER, GAMS,  MSPIKE, SPEI  Lab Results  Component Value Date   WBC 6.9 06/20/2020   NEUTROABS 4.6 06/20/2020   HGB 14.3 06/20/2020   HCT 42.8 06/20/2020   MCV 89.2 06/20/2020   PLT 224 06/20/2020    No results found for: LABCA2  No components found for: [id number]  No results for input(s): INR in the last 168 hours.  No results found for: LABCA2  No results found for: CAN199  No results found for: ZRA076  No results found for: AUQ333  Lab Results  Component Value Date   CA2729 400.7 (H) 06/20/2020    No components found for: HGQUANT  Lab Results  Component Value Date   CEA1 1,129.20 (H) 06/20/2020   /  CEA ([facility])  Date Value Ref Range Status  06/20/2020 1,129.20 (H) 0.00 - 5.00 ng/mL Final    Comment:    (NOTE) This test was performed using Architect's Chemiluminescent Microparticle Immunoassay. Values obtained from different assay methods cannot be used interchangeably. Please note that 5-10% of patients who smoke may see CEA levels up to 6.9 ng/mL. Performed at [facility], [place] [address] [name],  [address]      No results found for: AFPTUMOR  No results found for: CHROMOGRNA  No results found for: KPAFRELGTCHN, LAMBDASER, KAPLAMBRATIO (kappa/lambda light chains)  No results found for: HGBA, HGBA2QUANT, HGBFQUANT, HGBSQUAN (Hemoglobinopathy evaluation)   No results found for: LDH  No results found for: IRON, TIBC, IRONPCTSAT (Iron and TIBC)  No results found for: FERRITIN  Urinalysis    Component Value Date/Time   COLORURINE STRAW (A) 04/27/2020 0928   APPEARANCEUR CLEAR 04/27/2020 0928   LABSPEC 1.015 05/30/2020 1402   PHURINE 6.0 05/30/2020 1402   GLUCOSEU NEGATIVE 05/30/2020 1402   HGBUR NEGATIVE 05/30/2020 1402   BILIRUBINUR NEGATIVE 05/30/2020 1402   KETONESUR NEGATIVE 05/30/2020 1402   PROTEINUR NEGATIVE 05/30/2020 1402   UROBILINOGEN 0.2 05/30/2020 1402   NITRITE NEGATIVE 05/30/2020 1402    LEUKOCYTESUR NEGATIVE 05/30/2020 1402     STUDIES: DG Chest 2 View  Result Date: 06/15/2020 CLINICAL DATA:  Chest pain.  Back pain. EXAM: CHEST - 2 VIEW COMPARISON:  None. FINDINGS: Long segment segment of sclerosis of the posterior lateral LEFT fifth  rib. Within the midportion of the sclerosis there is callus formation indicating healed fracture. Additional healed fractures in lower LEFT ribs. Normal cardiac silhouette.  No effusion, infiltrate or pneumothorax. IMPRESSION: 1. Concern for bone metastasis with pathologic fracture of the LEFT fifth rib. Recommend CT thorax with contrast to evaluate for sclerotic skeletal metastasis and other evidence of malignancy. 2. No acute cardiopulmonary findings. Electronically Signed   By: Suzy Bouchard M.D.   On: 06/15/2020 10:43   CT Chest W Contrast  Addendum Date: 06/15/2020   ADDENDUM REPORT: 06/15/2020 20:14 ADDENDUM: Additional impression point: Nodule in the right breast with punctate calcifications, recommend correlation with recent mammography or further outpatient mammographic assessment if not recently performed. Electronically Signed   By: Lovena Le M.D.   On: 06/15/2020 20:14   Result Date: 06/15/2020 CLINICAL DATA:  Breast cancer with constipation and bone pain EXAM: CT CHEST, ABDOMEN, AND PELVIS WITH CONTRAST TECHNIQUE: Multidetector CT imaging of the chest, abdomen and pelvis was performed following the standard protocol during bolus administration of intravenous contrast. CONTRAST:  140m OMNIPAQUE IOHEXOL 300 MG/ML  SOLN COMPARISON:  CT renal colic 022/[id number] CT abdomen pelvis 11/10/2018 FINDINGS: CT CHEST FINDINGS Cardiovascular: Normal heart size. No pericardial effusion. Atherosclerotic plaque within the normal caliber aorta. Atheromatous plaque at the origins of the great vessels which branch normally from the aortic arch. Proximal great vessels are otherwise unremarkable. Central pulmonary arteries are normal caliber. No large central  or lobar filling defects on this non tailored examination of the pulmonary arteries. Mediastinum/Nodes: No mediastinal fluid or gas. Normal thyroid gland and thoracic inlet. No acute abnormality of the trachea or esophagus. No worrisome mediastinal, hilar or axillary adenopathy. Lungs/Pleura: Subpleural reticular changes along the right anterior upper and middle lobes may be related to prior radiation therapy some dependent atelectatic changes are seen posteriorly. Biapical pleuroparenchymal scarring. Some paraseptal emphysematous change versus bullous disease noted in the bilateral apices. No consolidative opacities, convincing features of edema, pneumothorax or effusion. Solid 3 mm nodule seen in the superior segment left lower lobe (5/73) solid 5 mm nodule in the right apex (5/19). Ill-defined ground-glass opacities present in the left upper lobe measuring up to 7 mm (5/31) and 6 mm (5/19) in size. Musculoskeletal: There are blastic appearing lesions involving the T4, T5, T6 vertebral bodies with extension into the posterior elements, smaller lesion at the T3 level as well. There are mixed sclerotic lesions throughout multiple thoracic ribs including a more focally sclerotic and expansile lesions with periosteal thickening and possible soft tissue involvement at the left posterior fifth, left anterior seventh and more diffusely throughout the left sixth rib. Few macro calcifications seen in the right breast as well as a more stippled calcification within a small 10 mm breast mass at the 9 o'clock position. CT ABDOMEN PELVIS FINDINGS Hepatobiliary: Diffuse hepatic hypoattenuation compatible with hepatic steatosis. No focal liver abnormality is seen. No gallstones, gallbladder wall thickening, or biliary dilatation. Pancreas: Unremarkable. No pancreatic ductal dilatation or surrounding inflammatory changes. Spleen: Normal in size without focal abnormality. Adrenals/Urinary Tract: Lobular thickening of the adrenal  glands similar to comparison. Previously characterized as probable hyperplasia. Kidneys enhance and excrete symmetrically. Few punctate nonobstructing calculi present in both kidneys. No obstructive urolithiasis or hydronephrosis. Urinary bladder is unremarkable. Stomach/Bowel: Distal esophagus, stomach and duodenal sweep are unremarkable. No small bowel wall thickening or dilatation. Mild distal small bowel fecalization without evidence of obstruction. A normal appendix is visualized. No colonic dilatation or wall thickening. Vascular/Lymphatic: Atherosclerotic calcifications throughout  the abdominal aorta and branch vessels. No aneurysm or ectasia. No enlarged abdominopelvic lymph nodes. Some irregular fusiform ectasia of the infrarenal abdominal aorta but with maximal diameter of 2.1 cm. Reproductive: Uterus is surgically absent. No concerning adnexal lesions. No concerning adnexal lesions. Other: No abdominopelvic free fluid or free gas. No bowel containing hernias. Musculoskeletal: Sclerotic involvement of the right sacral ala compatible with additional site of bony metastatic disease. No other convincing sites of IMPRESSION: 1. Multifocal predominantly blastic appearing osseous metastatic disease involving the thoracic and lumbar spine, multiple ribs most prominently at the left fifth through seventh ribs, and the right sacral ala. Consider outpatient bone scan for further evaluation and detection additional metastatic foci. 2. Mixed solid nodules and ill-defined ground-glass opacities in the left upper lobe measuring up to 7 mm and 6 mm in size, nonspecific possibly reflecting infectious or inflammatory change, primary lung process, or metastatic disease. Given the patient's history of known primary malignancy, typical Fleischner Society criteria guidelines do not apply. Consider three-month interval follow-up to establish stability or assess for interval growth. 3. Mild distal small bowel fecalization  without evidence of obstruction. Correlate for slowed intestinal transit. No evidence of bowel obstruction or large colonic stool burden. 4. Hepatic steatosis. 5. Lobular thickening of the adrenal glands similar to comparison, Previously characterized as probable hyperplasia. 6. Bilateral nonobstructing nephrolithiasis. 7. Aortic Atherosclerosis (ICD10-I70.0). Electronically Signed: By: Lovena Le M.D. On: 06/15/2020 19:40   CT ABDOMEN PELVIS W CONTRAST  Addendum Date: 06/15/2020   ADDENDUM REPORT: 06/15/2020 20:14 ADDENDUM: Additional impression point: Nodule in the right breast with punctate calcifications, recommend correlation with recent mammography or further outpatient mammographic assessment if not recently performed. Electronically Signed   By: Lovena Le M.D.   On: 06/15/2020 20:14   Result Date: 06/15/2020 CLINICAL DATA:  Breast cancer with constipation and bone pain EXAM: CT CHEST, ABDOMEN, AND PELVIS WITH CONTRAST TECHNIQUE: Multidetector CT imaging of the chest, abdomen and pelvis was performed following the standard protocol during bolus administration of intravenous contrast. CONTRAST:  127m OMNIPAQUE IOHEXOL 300 MG/ML  SOLN COMPARISON:  CT renal colic 080/99/8338 CT abdomen pelvis 11/10/2018 FINDINGS: CT CHEST FINDINGS Cardiovascular: Normal heart size. No pericardial effusion. Atherosclerotic plaque within the normal caliber aorta. Atheromatous plaque at the origins of the great vessels which branch normally from the aortic arch. Proximal great vessels are otherwise unremarkable. Central pulmonary arteries are normal caliber. No large central or lobar filling defects on this non tailored examination of the pulmonary arteries. Mediastinum/Nodes: No mediastinal fluid or gas. Normal thyroid gland and thoracic inlet. No acute abnormality of the trachea or esophagus. No worrisome mediastinal, hilar or axillary adenopathy. Lungs/Pleura: Subpleural reticular changes along the right anterior  upper and middle lobes may be related to prior radiation therapy some dependent atelectatic changes are seen posteriorly. Biapical pleuroparenchymal scarring. Some paraseptal emphysematous change versus bullous disease noted in the bilateral apices. No consolidative opacities, convincing features of edema, pneumothorax or effusion. Solid 3 mm nodule seen in the superior segment left lower lobe (5/73) solid 5 mm nodule in the right apex (5/19). Ill-defined ground-glass opacities present in the left upper lobe measuring up to 7 mm (5/31) and 6 mm (5/19) in size. Musculoskeletal: There are blastic appearing lesions involving the T4, T5, T6 vertebral bodies with extension into the posterior elements, smaller lesion at the T3 level as well. There are mixed sclerotic lesions throughout multiple thoracic ribs including a more focally sclerotic and expansile lesions with periosteal thickening and possible  soft tissue involvement at the left posterior fifth, left anterior seventh and more diffusely throughout the left sixth rib. Few macro calcifications seen in the right breast as well as a more stippled calcification within a small 10 mm breast mass at the 9 o'clock position. CT ABDOMEN PELVIS FINDINGS Hepatobiliary: Diffuse hepatic hypoattenuation compatible with hepatic steatosis. No focal liver abnormality is seen. No gallstones, gallbladder wall thickening, or biliary dilatation. Pancreas: Unremarkable. No pancreatic ductal dilatation or surrounding inflammatory changes. Spleen: Normal in size without focal abnormality. Adrenals/Urinary Tract: Lobular thickening of the adrenal glands similar to comparison. Previously characterized as probable hyperplasia. Kidneys enhance and excrete symmetrically. Few punctate nonobstructing calculi present in both kidneys. No obstructive urolithiasis or hydronephrosis. Urinary bladder is unremarkable. Stomach/Bowel: Distal esophagus, stomach and duodenal sweep are unremarkable. No  small bowel wall thickening or dilatation. Mild distal small bowel fecalization without evidence of obstruction. A normal appendix is visualized. No colonic dilatation or wall thickening. Vascular/Lymphatic: Atherosclerotic calcifications throughout the abdominal aorta and branch vessels. No aneurysm or ectasia. No enlarged abdominopelvic lymph nodes. Some irregular fusiform ectasia of the infrarenal abdominal aorta but with maximal diameter of 2.1 cm. Reproductive: Uterus is surgically absent. No concerning adnexal lesions. No concerning adnexal lesions. Other: No abdominopelvic free fluid or free gas. No bowel containing hernias. Musculoskeletal: Sclerotic involvement of the right sacral ala compatible with additional site of bony metastatic disease. No other convincing sites of IMPRESSION: 1. Multifocal predominantly blastic appearing osseous metastatic disease involving the thoracic and lumbar spine, multiple ribs most prominently at the left fifth through seventh ribs, and the right sacral ala. Consider outpatient bone scan for further evaluation and detection additional metastatic foci. 2. Mixed solid nodules and ill-defined ground-glass opacities in the left upper lobe measuring up to 7 mm and 6 mm in size, nonspecific possibly reflecting infectious or inflammatory change, primary lung process, or metastatic disease. Given the patient's history of known primary malignancy, typical Fleischner Society criteria guidelines do not apply. Consider three-month interval follow-up to establish stability or assess for interval growth. 3. Mild distal small bowel fecalization without evidence of obstruction. Correlate for slowed intestinal transit. No evidence of bowel obstruction or large colonic stool burden. 4. Hepatic steatosis. 5. Lobular thickening of the adrenal glands similar to comparison, Previously characterized as probable hyperplasia. 6. Bilateral nonobstructing nephrolithiasis. 7. Aortic Atherosclerosis  (ICD10-I70.0). Electronically Signed: By: Lovena Le M.D. On: 06/15/2020 19:40     ELIGIBLE FOR AVAILABLE RESEARCH PROTOCOL: no  ASSESSMENT: 64 y.o. [place] woman   (1) status post right lumpectomy and axillary lymph node dissection in 2007 for a stage III invasive breast cancer, additional pathology details to be reviewed when available  (a) a total of 13 axillary lymph nodes were removed, 11 positive  (b) adjuvant chemotherapy consisted of docetaxel, doxorubicin and cyclophosphamide x6  (c) the patient received adjuvant radiation  (d) received letrozole briefly but did not tolerate it.  METASTATIC DISEASE (2) CT scans of the chest abdomen and pelvis 06/15/2020 shows widespread bony metastatic disease, no definitive visceral disease  (a) bone marrow biopsy scheduled for 06/20/2020 nondiagnostic  (b) F 18 estradiol PET scan (cerianna) pending  (c) CA 27 was 400.7 at baseline 06/20/2020.  (3) if cancer is estrogen receptor positive, as expected, will start fulvestrant/palbociclib  (4) supportive therapy:  (a) bowel prophylaxis in place  (b) Tylenol/Aleve 3 times daily, MSIR 15 mg every 6 hours as needed   PLAN: Catherine Tyler's bone marrow biopsy did not give [place] an answer.  Were going to proceed with a cerianna scan to see if the bone lesions are estrogen receptor avid, which would direct therapy.  In the meantime we can start denosumab/Xgeva.  I think that may help with her bone pain.  We discussed the possible toxicities side effects and complications including concerns regarding osteonecrosis of the jaw, which is rare but still possible.  She will receive her first dose 07/13/2020  We discussed the fact that her CA 27-29 tumor marker is elevated which is strongly suggestive of a breast cancer primary.  Of course that does not answer the estrogen receptor question.  I reviewed her pain management and bowel prophylaxis regimen today.  She had good results with Linzess previously and I  went ahead and wrote her a prescription in case she is able to obtain it.  I believe cost may be any concern.  She is planning to apply for disability and I think that is entirely reasonable  She will see [place] again next week.  She knows to call for any other issue that may develop before the next visit.  Total encounter time 35 minutes.Sarajane Jews C. Jamesa Tedrick, MD 07/07/2020 2:40 PM Medical Oncology [facility] H[facility] [place], [place] [address] Tel. [phone]    Fax. [phone]   This document serves as a record of services personally performed by Lurline Del, MD. It was created on his behalf by Wilburn Mylar, a trained medical scribe. The creation of this record is based on the scribe's personal observations and the provider's statements to them.   I, Lurline Del MD, have reviewed the above documentation for accuracy and completeness, and I agree with the above.   *Total Encounter Time as defined by the Centers for Medicare and Medicaid Services includes, in addition to the face-to-face time of a patient visit (documented in the note above) non-face-to-face time: obtaining and reviewing outside history, ordering and reviewing medications, tests or procedures, care coordination (communications with other health care professionals or caregivers) and documentation in the medical record.

## 2020-07-07 ENCOUNTER — Inpatient Hospital Stay: Payer: Medicaid Other

## 2020-07-07 ENCOUNTER — Inpatient Hospital Stay: Payer: Medicaid Other | Attending: Oncology | Admitting: Oncology

## 2020-07-07 ENCOUNTER — Other Ambulatory Visit: Payer: Self-pay

## 2020-07-07 VITALS — BP 147/80 | HR 72 | Temp 98.3°F | Resp 18 | Ht 65.0 in | Wt 157.3 lb

## 2020-07-07 DIAGNOSIS — C7951 Secondary malignant neoplasm of bone: Secondary | ICD-10-CM | POA: Insufficient documentation

## 2020-07-07 DIAGNOSIS — R971 Elevated cancer antigen 125 [CA 125]: Secondary | ICD-10-CM | POA: Insufficient documentation

## 2020-07-07 DIAGNOSIS — Z170 Estrogen receptor positive status [ER+]: Secondary | ICD-10-CM | POA: Diagnosis not present

## 2020-07-07 DIAGNOSIS — R42 Dizziness and giddiness: Secondary | ICD-10-CM | POA: Insufficient documentation

## 2020-07-07 DIAGNOSIS — M898X9 Other specified disorders of bone, unspecified site: Secondary | ICD-10-CM | POA: Diagnosis not present

## 2020-07-07 DIAGNOSIS — C50811 Malignant neoplasm of overlapping sites of right female breast: Secondary | ICD-10-CM

## 2020-07-07 DIAGNOSIS — Z853 Personal history of malignant neoplasm of breast: Secondary | ICD-10-CM | POA: Diagnosis present

## 2020-07-07 DIAGNOSIS — I10 Essential (primary) hypertension: Secondary | ICD-10-CM

## 2020-07-07 DIAGNOSIS — F17210 Nicotine dependence, cigarettes, uncomplicated: Secondary | ICD-10-CM | POA: Diagnosis not present

## 2020-07-07 MED ORDER — DOCUSATE SODIUM 100 MG PO CAPS
200.0000 mg | ORAL_CAPSULE | Freq: Two times a day (BID) | ORAL | 0 refills | Status: DC | PRN
Start: 2020-07-07 — End: 2020-09-20

## 2020-07-07 MED ORDER — MORPHINE SULFATE 15 MG PO TABS: 15.0000 mg | ORAL_TABLET | Freq: Four times a day (QID) | ORAL | 0 refills | Status: DC | PRN

## 2020-07-07 MED ORDER — LINACLOTIDE 72 MCG PO CAPS: 72.0000 ug | ORAL_CAPSULE | Freq: Every day | ORAL | 0 refills | Status: DC

## 2020-07-11 MED ORDER — LISINOPRIL 10 MG PO TABS: 10.0000 mg | ORAL_TABLET | Freq: Every day | ORAL | 11 refills | Status: DC

## 2020-07-13 ENCOUNTER — Inpatient Hospital Stay (HOSPITAL_BASED_OUTPATIENT_CLINIC_OR_DEPARTMENT_OTHER): Payer: Medicaid Other | Admitting: Oncology

## 2020-07-13 ENCOUNTER — Telehealth: Payer: Self-pay | Admitting: Oncology

## 2020-07-13 ENCOUNTER — Other Ambulatory Visit: Payer: Self-pay

## 2020-07-13 ENCOUNTER — Inpatient Hospital Stay: Payer: Medicaid Other

## 2020-07-13 VITALS — BP 120/96 | HR 77 | Temp 98.5°F | Resp 20 | Ht 65.0 in | Wt 156.1 lb

## 2020-07-13 DIAGNOSIS — C7951 Secondary malignant neoplasm of bone: Secondary | ICD-10-CM

## 2020-07-13 DIAGNOSIS — C50911 Malignant neoplasm of unspecified site of right female breast: Secondary | ICD-10-CM

## 2020-07-13 DIAGNOSIS — C50811 Malignant neoplasm of overlapping sites of right female breast: Secondary | ICD-10-CM

## 2020-07-13 DIAGNOSIS — Z170 Estrogen receptor positive status [ER+]: Secondary | ICD-10-CM

## 2020-07-13 DIAGNOSIS — G893 Neoplasm related pain (acute) (chronic): Secondary | ICD-10-CM

## 2020-07-13 LAB — CBC WITH DIFFERENTIAL/PLATELET
Abs Immature Granulocytes: 0.01 10*3/uL (ref 0.00–0.07)
Basophils Absolute: 0.1 10*3/uL (ref 0.0–0.1)
Basophils Relative: 1 %
Eosinophils Absolute: 0.2 10*3/uL (ref 0.0–0.5)
Eosinophils Relative: 3 %
HCT: 41.5 % (ref 36.0–46.0)
Hemoglobin: 13.8 g/dL (ref 12.0–15.0)
Immature Granulocytes: 0 %
Lymphocytes Relative: 30 %
Lymphs Abs: 2.3 10*3/uL (ref 0.7–4.0)
MCH: 29.6 pg (ref 26.0–34.0)
MCHC: 33.3 g/dL (ref 30.0–36.0)
MCV: 89.1 fL (ref 80.0–100.0)
Monocytes Absolute: 0.5 10*3/uL (ref 0.1–1.0)
Monocytes Relative: 7 %
Neutro Abs: 4.5 10*3/uL (ref 1.7–7.7)
Neutrophils Relative %: 59 %
Platelets: 248 10*3/uL (ref 150–400)
RBC: 4.66 MIL/uL (ref 3.87–5.11)
RDW: 12.9 % (ref 11.5–15.5)
WBC: 7.6 10*3/uL (ref 4.0–10.5)
nRBC: 0 % (ref 0.0–0.2)

## 2020-07-13 LAB — COMPREHENSIVE METABOLIC PANEL
ALT: 23 U/L (ref 0–44)
AST: 28 U/L (ref 15–41)
Albumin: 3.6 g/dL (ref 3.5–5.0)
Alkaline Phosphatase: 335 U/L — ABNORMAL HIGH (ref 38–126)
Anion gap: 8 (ref 5–15)
BUN: 13 mg/dL (ref 8–23)
CO2: 27 mmol/L (ref 22–32)
Calcium: 9.3 mg/dL (ref 8.9–10.3)
Chloride: 103 mmol/L (ref 98–111)
Creatinine, Ser: 0.73 mg/dL (ref 0.44–1.00)
GFR calc Af Amer: >60 mL/min (ref >60)
GFR calc non Af Amer: >60 mL/min (ref >60)
Glucose, Bld: 120 mg/dL — ABNORMAL HIGH (ref 70–99)
Potassium: 3.9 mmol/L (ref 3.5–5.1)
Sodium: 138 mmol/L (ref 135–145)
Total Bilirubin: 0.4 mg/dL (ref 0.3–1.2)
Total Protein: 6.9 g/dL (ref 6.5–8.1)

## 2020-07-13 MED ORDER — DENOSUMAB 120 MG/1.7ML ~~LOC~~ SOLN
120.0000 mg | Freq: Once | SUBCUTANEOUS | Status: AC
  Administered 2020-07-13: 120 mg via SUBCUTANEOUS

## 2020-07-13 MED ORDER — FULVESTRANT 250 MG/5ML IM SOLN
INTRAMUSCULAR | Status: AC
  Filled 2020-07-13: qty 5

## 2020-07-13 MED ORDER — FULVESTRANT 250 MG/5ML IM SOLN: 500.0000 mg | Freq: Once | INTRAMUSCULAR | Status: DC

## 2020-07-13 NOTE — Telephone Encounter (Signed)
Scheduled appts per 7/15 los. Gave pt a print out of AVS.

## 2020-07-13 NOTE — Progress Notes (Signed)
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  From her report, this appears to have been about 3 cm and to have involved 11 of the 13 right axillary lymph nodes involved.  She was treated at [facility] by Dr.  She was treated under Dr Epimenio Foot in [facility] with lumpectomy, chemotherapy (TAC x6) , and radiation therapy.  She then tried Femara which she did not tolerate and no further antiestrogens were prescribed.  She presented to the ED yesterday, 06/15/2020, with diffuse back, chest, and abdominal pain, as well as unintentional weight loss. Chest x-ray performed at that time showed: concern for bone metastasis with pathologic fracture of left 5th rib.  She proceeded to chest, abdomen, pelvis CT that day, which showed: multifocal predominantly blastic-appearing osseous metastatic disease involving the thoracic and lumbar spine, multiple ribs most prominently at the left fifth through seventh ribs, and the right sacral ala; nonspecific mixed solid nodules and ill-defined ground-glass opacities in the left upper lobe measuring up to 7 mm and 6 mm in size; mild distal small bowel fecalization without evidence of obstruction.  Of note, her most recent mammogram on file is a bilateral diagnostic scan from 08/11/2018. At that time, she presented with a burning sensation to the inferior right breast. Mammogram showed: breast density category B; no mammographic evidence of malignancy in the bilateral breasts.   The patient's subsequent history is as detailed  below.   INTERVAL HISTORY: Catherine Tyler returns today for follow-up and treatment of her stage IV cancer. She was last seen here on 07/07/2020.  This is the first visit at which she is not accompanied by her Catherine Tyler.  She says she needed to give [place] day off.   Lab Results  Component Value Date   CA2729 400.7 (H) 06/20/2020   Lab Results  Component Value Date   CEA1 1,129.20 (H) 06/20/2020   She is scheduled for a head CT scan on 07/14/2020.  We also have orders for nna scan which has not yet been scheduled  REVIEW OF SYSTEMS: Catherine Tyler is not doing well today she says.  She has significant pain.  Yesterday she did a lot of activities around the house and she thinks she is pending for today.  Yesterday she had 3 MSIR.  She says she took the Tylenol/Aleve combination twice.  This morning she took half a morphine tablet and then repeated that half an hour later and then took a morphine tablet at 11.  She is moderately constipated.  PAST MEDICAL HISTORY: Past Medical History:  Diagnosis Date  . Cancer ([place])   . Personal history of chemotherapy   . Personal history of radiation therapy     PAST SURGICAL HISTORY: Past Surgical History:  Procedure Laterality Date  . BREAST LUMPECTOMY    . BREAST SURGERY      FAMILY HISTORY: Family History  Problem Relation Age of Onset  . Hyperlipidemia Mother   . Heart failure Mother   The patient's father died at age 51 from a myocardial infarction.  As far as the patient knows  there is no history of cancer on that side of the family.  The patient's mother died at age 72 from renal failure.  The patient is not aware of any cancer on that side of the family.  The patient herself has 5 sisters, no brothers.  1 sister Catherine Tyler) had lung cancer.   GYNECOLOGIC HISTORY:  No LMP recorded. Patient is postmenopausal. Menarche: 64 years old Age at first live birth: 64 years old [place] P 1 LMP status post hysterectomy at age 37 secondary to fibroids; status  post remote conization for in situ cervical carcinoma BSO?    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  She lives by herself with her dog Catherine Tyler who is a rescue.  The patient has been divorced 13 years as of June 2021.  Her son Catherine Tyler lives in [place] where he works in Architect.  The patient has 1 grandchild.  She attends [employer].  ADVANCED DIRECTIVES: To be discussed   HEALTH MAINTENANCE: Social History   Tobacco Use  . Smoking status: Current Some Day Smoker  . Smokeless tobacco: Never Used  Vaping Use  . Vaping Use: Never used  Substance Use Topics  . Alcohol use: Yes    Comment: on occasion  . Drug use: Never     Colonoscopy: Never  PAP: 07/2018, negative  Bone density: Never   Allergies  Allergen Reactions  . Sulfa Antibiotics Nausea And Vomiting  . Tape Other (See Comments)    Takes off skin    Current Outpatient Medications  Medication Sig Dispense Refill  . acetaminophen (TYLENOL) 500 MG tablet Take 1 tablet (500 mg total) by mouth with breakfast, with lunch, and with evening meal. Take together with aleve 220 mg 30 tablet 0  . bisacodyl (DULCOLAX) 10 MG suppository Place 1 suppository (10 mg total) rectally as needed for moderate constipation. 12 suppository 0  . docusate sodium (COLACE) 100 MG capsule Take 2 capsules (200 mg total) by mouth 2 (two) times daily as needed for mild constipation. 80 capsule 0  . linaclotide (LINZESS) 72 MCG capsule Take 1 capsule (72 mcg total) by mouth daily before breakfast. 30 capsule 0  . lisinopril (ZESTRIL) 10 MG tablet Take 1 tablet (10 mg total) by mouth daily. 30 tablet 11  . morphine (MSIR) 15 MG tablet Take 1 tablet (15 mg total) by mouth every 6 (six) hours as needed for severe pain. 60 tablet 0  . naproxen sodium (ALEVE) 220 MG tablet Take 1 tablet (220 mg total) by mouth with breakfast, with lunch, and with evening meal. 90 tablet 4    . ondansetron (ZOFRAN ODT) 4 MG disintegrating tablet Take 1 tablet (4 mg total) by mouth every 8 (eight) hours as needed for nausea or vomiting. 20 tablet 0  . polyethylene glycol (MIRALAX / GLYCOLAX) 17 g packet Take 17 g by mouth daily.     No current facility-administered medications for this visit.   Facility-Administered Medications Ordered in Other Visits  Medication Dose Route Frequency Provider Last Rate Last Admin  . denosumab (XGEVA) injection 120 mg  120 mg Subcutaneous Once Koreen Lizaola, Virgie Dad, MD      . fulvestrant (FASLODEX) injection 500 mg  500 mg Intramuscular Once Bena Kobel, Virgie Dad, MD        OBJECTIVE: White woman who appears uncomfortable  Vitals:   07/13/20 1304  BP: (!) 120/96  Pulse: 77  Resp:  20  Temp: 98.5 F (36.9 C)  SpO2: 99%   Wt Readings from Last 3 Encounters:  07/13/20 156 lb 1.6 oz (70.8 kg)  07/07/20 157 lb 4.8 oz (71.4 kg)  06/16/20 159 lb 4.8 oz (72.3 kg)   Body mass index is 25.98 kg/m.    ECOG FS:1 - Symptomatic but completely ambulatory  Ocular: Sclerae unicteric, pupils round and equal Ear-nose-throat: Wearing a mask Lymphatic: No cervical or supraclavicular adenopathy Lungs no rales or rhonchi Heart regular rate and rhythm Abd soft, nontender, positive bowel sounds MSK no focal spinal tenderness, no joint edema Neuro: non-focal, well-oriented, appropriate affect Breasts: Status post right lumpectomy followed by radiation with no evidence of local recurrence.  The left breast is benign.  Both axillae are benign.  LAB RESULTS:  CMP     Component Value Date/Time   NA 138 07/13/2020 1250   NA 140 09/02/2018 1106   K 3.9 07/13/2020 1250   CL 103 07/13/2020 1250   CO2 27 07/13/2020 1250   GLUCOSE 120 (H) 07/13/2020 1250   BUN 13 07/13/2020 1250   BUN 22 09/02/2018 1106   CREATININE 0.73 07/13/2020 1250   CALCIUM 9.3 07/13/2020 1250   PROT 6.9 07/13/2020 1250   ALBUMIN 3.6 07/13/2020 1250   AST 28 07/13/2020 1250   ALT  23 07/13/2020 1250   ALKPHOS 335 (H) 07/13/2020 1250   BILITOT 0.4 07/13/2020 1250   GFRNONAA >60 07/13/2020 1250   GFRAA >60 07/13/2020 1250    No results found for: TOTALPROTELP, ALBUMINELP, A1GS, A2GS, BETS, BETA2SER, GAMS, MSPIKE, SPEI  Lab Results  Component Value Date   WBC 7.6 07/13/2020   NEUTROABS 4.5 07/13/2020   HGB 13.8 07/13/2020   HCT 41.5 07/13/2020   MCV 89.1 07/13/2020   PLT 248 07/13/2020    No results found for: LABCA2  No components found for: SVXBLT903  No results for input(s): INR in the last 168 hours.  No results found for: LABCA2  No results found for: CAN199  No results found for: ESP233  No results found for: AQT622  Lab Results  Component Value Date   CA2729 400.7 (H) 06/20/2020    No components found for: HGQUANT  Lab Results  Component Value Date   CEA1 1,129.20 (H) 06/20/2020   /  CEA ([facility])  Date Value Ref Range Status  06/20/2020 1,129.20 (H) 0.00 - 5.00 Tyler/mL Final    Comment:    (NOTE) This test was performed using Architect's Chemiluminescent Microparticle Immunoassay. Values obtained from different assay methods cannot be used interchangeably. Please note that 5-10% of patients who smoke may see CEA levels up to 6.9 Tyler/mL. Performed at [facility], [place] [address] [place],  [address]      No results found for: AFPTUMOR  No results found for: CHROMOGRNA  No results found for: KPAFRELGTCHN, LAMBDASER, KAPLAMBRATIO (kappa/lambda light chains)  No results found for: HGBA, HGBA2QUANT, HGBFQUANT, HGBSQUAN (Hemoglobinopathy evaluation)   No results found for: LDH  No results found for: IRON, TIBC, IRONPCTSAT (Iron and TIBC)  No results found for: FERRITIN  Urinalysis    Component Value Date/Time   COLORURINE STRAW (A) 04/27/2020 0928   APPEARANCEUR CLEAR 04/27/2020 0928   LABSPEC 1.015 05/30/2020 1402   PHURINE 6.0 05/30/2020 1402   GLUCOSEU NEGATIVE 05/30/2020  1402   HGBUR NEGATIVE 05/30/2020 1402   BILIRUBINUR NEGATIVE 05/30/2020 1402   KETONESUR NEGATIVE 05/30/2020 1402   PROTEINUR NEGATIVE 05/30/2020 1402   UROBILINOGEN 0.2  05/30/2020 1402   NITRITE NEGATIVE 05/30/2020 1402   LEUKOCYTESUR NEGATIVE 05/30/2020 1402     STUDIES: DG Chest 2 View  Result Date: 06/15/2020 CLINICAL DATA:  Chest pain.  Back pain. EXAM: CHEST - 2 VIEW COMPARISON:  None. FINDINGS: Long segment segment of sclerosis of the posterior lateral LEFT fifth rib. Within the midportion of the sclerosis there is callus formation indicating healed fracture. Additional healed fractures in lower LEFT ribs. Normal cardiac silhouette.  No effusion, infiltrate or pneumothorax. IMPRESSION: 1. Concern for bone metastasis with pathologic fracture of the LEFT fifth rib. Recommend CT thorax with contrast to evaluate for sclerotic skeletal metastasis and other evidence of malignancy. 2. No acute cardiopulmonary findings. Electronically Signed   By: Suzy Bouchard M.D.   On: 06/15/2020 10:43   CT Chest W Contrast  Addendum Date: 06/15/2020   ADDENDUM REPORT: 06/15/2020 20:14 ADDENDUM: Additional impression point: Nodule in the right breast with punctate calcifications, recommend correlation with recent mammography or further outpatient mammographic assessment if not recently performed. Electronically Signed   By: Lovena Le M.D.   On: 06/15/2020 20:14   Result Date: 06/15/2020 CLINICAL DATA:  Breast cancer with constipation and bone pain EXAM: CT CHEST, ABDOMEN, AND PELVIS WITH CONTRAST TECHNIQUE: Multidetector CT imaging of the chest, abdomen and pelvis was performed following the standard protocol during bolus administration of intravenous contrast. CONTRAST:  182m OMNIPAQUE IOHEXOL 300 MG/ML  SOLN COMPARISON:  CT renal colic 047/42/5956 CT abdomen pelvis 11/10/2018 FINDINGS: CT CHEST FINDINGS Cardiovascular: Normal heart size. No pericardial effusion. Atherosclerotic plaque within the normal  caliber aorta. Atheromatous plaque at the origins of the great vessels which branch normally from the aortic arch. Proximal great vessels are otherwise unremarkable. Central pulmonary arteries are normal caliber. No large central or lobar filling defects on this non tailored examination of the pulmonary arteries. Mediastinum/Nodes: No mediastinal fluid or gas. Normal thyroid gland and thoracic inlet. No acute abnormality of the trachea or esophagus. No worrisome mediastinal, hilar or axillary adenopathy. Lungs/Pleura: Subpleural reticular changes along the right anterior upper and middle lobes may be related to prior radiation therapy some dependent atelectatic changes are seen posteriorly. Biapical pleuroparenchymal scarring. Some paraseptal emphysematous change versus bullous disease noted in the bilateral apices. No consolidative opacities, convincing features of edema, pneumothorax or effusion. Solid 3 mm nodule seen in the superior segment left lower lobe (5/73) solid 5 mm nodule in the right apex (5/19). Ill-defined ground-glass opacities present in the left upper lobe measuring up to 7 mm (5/31) and 6 mm (5/19) in size. Musculoskeletal: There are blastic appearing lesions involving the T4, T5, T6 vertebral bodies with extension into the posterior elements, smaller lesion at the T3 level as well. There are mixed sclerotic lesions throughout multiple thoracic ribs including a more focally sclerotic and expansile lesions with periosteal thickening and possible soft tissue involvement at the left posterior fifth, left anterior seventh and more diffusely throughout the left sixth rib. Few macro calcifications seen in the right breast as well as a more stippled calcification within a small 10 mm breast mass at the 9 o'clock position. CT ABDOMEN PELVIS FINDINGS Hepatobiliary: Diffuse hepatic hypoattenuation compatible with hepatic steatosis. No focal liver abnormality is seen. No gallstones, gallbladder wall  thickening, or biliary dilatation. Pancreas: Unremarkable. No pancreatic ductal dilatation or surrounding inflammatory changes. Spleen: Normal in size without focal abnormality. Adrenals/Urinary Tract: Lobular thickening of the adrenal glands similar to comparison. Previously characterized as probable hyperplasia. Kidneys enhance and excrete symmetrically. Few punctate  nonobstructing calculi present in both kidneys. No obstructive urolithiasis or hydronephrosis. Urinary bladder is unremarkable. Stomach/Bowel: Distal esophagus, stomach and duodenal sweep are unremarkable. No small bowel wall thickening or dilatation. Mild distal small bowel fecalization without evidence of obstruction. A normal appendix is visualized. No colonic dilatation or wall thickening. Vascular/Lymphatic: Atherosclerotic calcifications throughout the abdominal aorta and branch vessels. No aneurysm or ectasia. No enlarged abdominopelvic lymph nodes. Some irregular fusiform ectasia of the infrarenal abdominal aorta but with maximal diameter of 2.1 cm. Reproductive: Uterus is surgically absent. No concerning adnexal lesions. No concerning adnexal lesions. Other: No abdominopelvic free fluid or free gas. No bowel containing hernias. Musculoskeletal: Sclerotic involvement of the right sacral ala compatible with additional site of bony metastatic disease. No other convincing sites of IMPRESSION: 1. Multifocal predominantly blastic appearing osseous metastatic disease involving the thoracic and lumbar spine, multiple ribs most prominently at the left fifth through seventh ribs, and the right sacral ala. Consider outpatient bone scan for further evaluation and detection additional metastatic foci. 2. Mixed solid nodules and ill-defined ground-glass opacities in the left upper lobe measuring up to 7 mm and 6 mm in size, nonspecific possibly reflecting infectious or inflammatory change, primary lung process, or metastatic disease. Given the patient's  history of known primary malignancy, typical Fleischner Society criteria guidelines do not apply. Consider three-month interval follow-up to establish stability or assess for interval growth. 3. Mild distal small bowel fecalization without evidence of obstruction. Correlate for slowed intestinal transit. No evidence of bowel obstruction or large colonic stool burden. 4. Hepatic steatosis. 5. Lobular thickening of the adrenal glands similar to comparison, Previously characterized as probable hyperplasia. 6. Bilateral nonobstructing nephrolithiasis. 7. Aortic Atherosclerosis (ICD10-I70.0). Electronically Signed: By: Lovena Le M.D. On: 06/15/2020 19:40   CT ABDOMEN PELVIS W CONTRAST  Addendum Date: 06/15/2020   ADDENDUM REPORT: 06/15/2020 20:14 ADDENDUM: Additional impression point: Nodule in the right breast with punctate calcifications, recommend correlation with recent mammography or further outpatient mammographic assessment if not recently performed. Electronically Signed   By: Lovena Le M.D.   On: 06/15/2020 20:14   Result Date: 06/15/2020 CLINICAL DATA:  Breast cancer with constipation and bone pain EXAM: CT CHEST, ABDOMEN, AND PELVIS WITH CONTRAST TECHNIQUE: Multidetector CT imaging of the chest, abdomen and pelvis was performed following the standard protocol during bolus administration of intravenous contrast. CONTRAST:  135m OMNIPAQUE IOHEXOL 300 MG/ML  SOLN COMPARISON:  CT renal colic 096/28/3662 CT abdomen pelvis 11/10/2018 FINDINGS: CT CHEST FINDINGS Cardiovascular: Normal heart size. No pericardial effusion. Atherosclerotic plaque within the normal caliber aorta. Atheromatous plaque at the origins of the great vessels which branch normally from the aortic arch. Proximal great vessels are otherwise unremarkable. Central pulmonary arteries are normal caliber. No large central or lobar filling defects on this non tailored examination of the pulmonary arteries. Mediastinum/Nodes: No mediastinal  fluid or gas. Normal thyroid gland and thoracic inlet. No acute abnormality of the trachea or esophagus. No worrisome mediastinal, hilar or axillary adenopathy. Lungs/Pleura: Subpleural reticular changes along the right anterior upper and middle lobes may be related to prior radiation therapy some dependent atelectatic changes are seen posteriorly. Biapical pleuroparenchymal scarring. Some paraseptal emphysematous change versus bullous disease noted in the bilateral apices. No consolidative opacities, convincing features of edema, pneumothorax or effusion. Solid 3 mm nodule seen in the superior segment left lower lobe (5/73) solid 5 mm nodule in the right apex (5/19). Ill-defined ground-glass opacities present in the left upper lobe measuring up to 7 mm (5/31)  and 6 mm (5/19) in size. Musculoskeletal: There are blastic appearing lesions involving the T4, T5, T6 vertebral bodies with extension into the posterior elements, smaller lesion at the T3 level as well. There are mixed sclerotic lesions throughout multiple thoracic ribs including a more focally sclerotic and expansile lesions with periosteal thickening and possible soft tissue involvement at the left posterior fifth, left anterior seventh and more diffusely throughout the left sixth rib. Few macro calcifications seen in the right breast as well as a more stippled calcification within a small 10 mm breast mass at the 9 o'clock position. CT ABDOMEN PELVIS FINDINGS Hepatobiliary: Diffuse hepatic hypoattenuation compatible with hepatic steatosis. No focal liver abnormality is seen. No gallstones, gallbladder wall thickening, or biliary dilatation. Pancreas: Unremarkable. No pancreatic ductal dilatation or surrounding inflammatory changes. Spleen: Normal in size without focal abnormality. Adrenals/Urinary Tract: Lobular thickening of the adrenal glands similar to comparison. Previously characterized as probable hyperplasia. Kidneys enhance and excrete  symmetrically. Few punctate nonobstructing calculi present in both kidneys. No obstructive urolithiasis or hydronephrosis. Urinary bladder is unremarkable. Stomach/Bowel: Distal esophagus, stomach and duodenal sweep are unremarkable. No small bowel wall thickening or dilatation. Mild distal small bowel fecalization without evidence of obstruction. A normal appendix is visualized. No colonic dilatation or wall thickening. Vascular/Lymphatic: Atherosclerotic calcifications throughout the abdominal aorta and branch vessels. No aneurysm or ectasia. No enlarged abdominopelvic lymph nodes. Some irregular fusiform ectasia of the infrarenal abdominal aorta but with maximal diameter of 2.1 cm. Reproductive: Uterus is surgically absent. No concerning adnexal lesions. No concerning adnexal lesions. Other: No abdominopelvic free fluid or free gas. No bowel containing hernias. Musculoskeletal: Sclerotic involvement of the right sacral ala compatible with additional site of bony metastatic disease. No other convincing sites of IMPRESSION: 1. Multifocal predominantly blastic appearing osseous metastatic disease involving the thoracic and lumbar spine, multiple ribs most prominently at the left fifth through seventh ribs, and the right sacral ala. Consider outpatient bone scan for further evaluation and detection additional metastatic foci. 2. Mixed solid nodules and ill-defined ground-glass opacities in the left upper lobe measuring up to 7 mm and 6 mm in size, nonspecific possibly reflecting infectious or inflammatory change, primary lung process, or metastatic disease. Given the patient's history of known primary malignancy, typical Fleischner Society criteria guidelines do not apply. Consider three-month interval follow-up to establish stability or assess for interval growth. 3. Mild distal small bowel fecalization without evidence of obstruction. Correlate for slowed intestinal transit. No evidence of bowel obstruction or  large colonic stool burden. 4. Hepatic steatosis. 5. Lobular thickening of the adrenal glands similar to comparison, Previously characterized as probable hyperplasia. 6. Bilateral nonobstructing nephrolithiasis. 7. Aortic Atherosclerosis (ICD10-I70.0). Electronically Signed: By: Lovena Le M.D. On: 06/15/2020 19:40     ELIGIBLE FOR AVAILABLE RESEARCH PROTOCOL: no  ASSESSMENT: 64 y.o. [place] woman   (1) status post right lumpectomy and axillary lymph node dissection in 2007 for a stage III invasive breast cancer, additional pathology details to be reviewed when available  (a) a total of 13 axillary lymph nodes were removed, 11 positive  (b) adjuvant chemotherapy consisted of docetaxel, doxorubicin and cyclophosphamide x6  (c) the patient received adjuvant radiation  (d) received letrozole briefly but did not tolerate it.  METASTATIC DISEASE (2) CT scans of the chest abdomen and pelvis 06/15/2020 shows widespread bony metastatic disease, no definitive visceral disease  (a) bone marrow biopsy  06/20/2020 nondiagnostic  (b) consider F 18 estradiol PET scan ([facility]) if bone marrow biopsy nondiagnostic  (  3) if cancer is estrogen receptor positive, as expected, will start fulvestrant/palbociclib  PLAN: Catherine Tyler's bone marrow biopsy was nondiagnostic.  We are waiting on the' F 18 estradiol scan to document estrogen receptor positivity in the bone lesions which will clench the diagnosis.  She does have an elevated CA 27-29 but also has a very elevated CEA, which is not that common in breast cancer.  Today she received her first denosumab/Xgeva dose and her first fulvestrant dose.  She will receive her next fulvestrant in 2 weeks and her next denosumab/Xgeva in 4 weeks.  I went over pain management with her again and again gave her the information in writing.  She really has to take the Tylenol and Aleve 3 times a day every single day as a baseline and not occasionally.  She then can take the  morphine as needed.  We also reviewed the bowel prophylaxis regimen and she is going to take 3 stool softeners twice daily and MiraLAX daily and use a laxative every third day if she needs it.  I urged her to keep these instructions to the letter so she can feel a little bit more comfortable  Otherwise she will see [place] again in 2 weeks.  She knows to call for any other issue that may develop before then.  Total encounter time 35 minutes.Sarajane Jews C. Lyon Dumont, MD 07/13/2020 2:39 PM Medical Oncology and Hematology [facility] [place], [place] [address] Tel. [phone]    Fax. [phone]   I, Jacqualyn Posey am acting as a Education administrator for Catherine Cruel, MD.   I, Lurline Del MD, have reviewed the above documentation for accuracy and completeness, and I agree with the above.    *Total Encounter Time as defined by the Centers for Medicare and Medicaid Services includes, in addition to the face-to-face time of a patient visit (documented in the note above) non-face-to-face time: obtaining and reviewing outside history, ordering and reviewing medications, tests or procedures, care coordination (communications with other health care professionals or caregivers) and documentation in the medical record.

## 2020-07-13 NOTE — Patient Instructions (Signed)
Denosumab injection What is this medicine? DENOSUMAB (den oh sue mab) slows bone breakdown. Prolia is used to treat osteoporosis in women after menopause and in men, and in people who are taking corticosteroids for 6 months or more. Xgeva is used to treat a high calcium level due to cancer and to prevent bone fractures and other bone problems caused by multiple myeloma or cancer bone metastases. Xgeva is also used to treat giant cell tumor of the bone. This medicine may be used for other purposes; ask your health care provider or pharmacist if you have questions. COMMON BRAND NAME(S): Prolia, XGEVA What should I tell my health care provider before I take this medicine? They need to know if you have any of these conditions:  dental disease  having surgery or tooth extraction  infection  kidney disease  low levels of calcium or Vitamin D in the blood  malnutrition  on hemodialysis  skin conditions or sensitivity  thyroid or parathyroid disease  an unusual reaction to denosumab, other medicines, foods, dyes, or preservatives  pregnant or trying to get pregnant  breast-feeding How should I use this medicine? This medicine is for injection under the skin. It is given by a health care professional in a hospital or clinic setting. A special MedGuide will be given to you before each treatment. Be sure to read this information carefully each time. For Prolia, talk to your pediatrician regarding the use of this medicine in children. Special care may be needed. For Xgeva, talk to your pediatrician regarding the use of this medicine in children. While this drug may be prescribed for children as young as 13 years for selected conditions, precautions do apply. Overdosage: If you think you have taken too much of this medicine contact a poison control center or emergency room at once. NOTE: This medicine is only for you. Do not share this medicine with others. What if I miss a dose? It is  important not to miss your dose. Call your doctor or health care professional if you are unable to keep an appointment. What may interact with this medicine? Do not take this medicine with any of the following medications:  other medicines containing denosumab This medicine may also interact with the following medications:  medicines that lower your chance of fighting infection  steroid medicines like prednisone or cortisone This list may not describe all possible interactions. Give your health care provider a list of all the medicines, herbs, non-prescription drugs, or dietary supplements you use. Also tell them if you smoke, drink alcohol, or use illegal drugs. Some items may interact with your medicine. What should I watch for while using this medicine? Visit your doctor or health care professional for regular checks on your progress. Your doctor or health care professional may order blood tests and other tests to see how you are doing. Call your doctor or health care professional for advice if you get a fever, chills or sore throat, or other symptoms of a cold or flu. Do not treat yourself. This drug may decrease your body's ability to fight infection. Try to avoid being around people who are sick. You should make sure you get enough calcium and vitamin D while you are taking this medicine, unless your doctor tells you not to. Discuss the foods you eat and the vitamins you take with your health care professional. See your dentist regularly. Brush and floss your teeth as directed. Before you have any dental work done, tell your dentist you are   receiving this medicine. Do not become pregnant while taking this medicine or for 5 months after stopping it. Talk with your doctor or health care professional about your birth control options while taking this medicine. Women should inform their doctor if they wish to become pregnant or think they might be pregnant. There is a potential for serious side  effects to an unborn child. Talk to your health care professional or pharmacist for more information. What side effects may I notice from receiving this medicine? Side effects that you should report to your doctor or health care professional as soon as possible:  allergic reactions like skin rash, itching or hives, swelling of the face, lips, or tongue  bone pain  breathing problems  dizziness  jaw pain, especially after dental work  redness, blistering, peeling of the skin  signs and symptoms of infection like fever or chills; cough; sore throat; pain or trouble passing urine  signs of low calcium like fast heartbeat, muscle cramps or muscle pain; pain, tingling, numbness in the hands or feet; seizures  unusual bleeding or bruising  unusually weak or tired Side effects that usually do not require medical attention (report to your doctor or health care professional if they continue or are bothersome):  constipation  diarrhea  headache  joint pain  loss of appetite  muscle pain  runny nose  tiredness  upset stomach This list may not describe all possible side effects. Call your doctor for medical advice about side effects. You may report side effects to FDA at 1-800-FDA-1088. Where should I keep my medicine? This medicine is only given in a clinic, doctor's office, or other health care setting and will not be stored at home. NOTE: This sheet is a summary. It may not cover all possible information. If you have questions about this medicine, talk to your doctor, pharmacist, or health care provider.  2020 [employer] Standard (2018-04-24 16:10:44)

## 2020-07-14 ENCOUNTER — Encounter: Payer: Self-pay | Admitting: Pharmacy Technician

## 2020-07-14 ENCOUNTER — Telehealth: Payer: Self-pay

## 2020-07-14 ENCOUNTER — Ambulatory Visit (HOSPITAL_COMMUNITY)
Admission: RE | Admit: 2020-07-14 | Discharge: 2020-07-14 | Disposition: A | Discharge status: Home / Self Care | Payer: Medicaid Other | Source: Ambulatory Visit | Attending: Oncology | Admitting: Oncology

## 2020-07-14 ENCOUNTER — Telehealth: Payer: Self-pay | Admitting: Adult Health

## 2020-07-14 DIAGNOSIS — C7951 Secondary malignant neoplasm of bone: Secondary | ICD-10-CM | POA: Diagnosis present

## 2020-07-14 DIAGNOSIS — C50811 Malignant neoplasm of overlapping sites of right female breast: Secondary | ICD-10-CM | POA: Insufficient documentation

## 2020-07-14 DIAGNOSIS — Z170 Estrogen receptor positive status [ER+]: Secondary | ICD-10-CM | POA: Insufficient documentation

## 2020-07-14 MED ORDER — IOHEXOL 300 MG/ML  SOLN
75.0000 mL | Freq: Once | INTRAMUSCULAR | Status: AC | PRN
  Administered 2020-07-14: 75 mL via INTRAVENOUS

## 2020-07-14 MED ORDER — SODIUM CHLORIDE (PF) 0.9 % IJ SOLN
INTRAMUSCULAR | Status: AC
  Filled 2020-07-14: qty 50

## 2020-07-14 NOTE — Telephone Encounter (Signed)
Called pt per 7/15 sch msg - pt is aware.

## 2020-07-14 NOTE — Telephone Encounter (Signed)
NA

## 2020-07-14 NOTE — Progress Notes (Signed)
Patient has been approved for drug assistance by [facility] for Faslodex. The enrollment period is from 07/14/20-07/13/21 based on self pay. First DOS covered is TBA.

## 2020-07-15 ENCOUNTER — Other Ambulatory Visit: Payer: Self-pay | Admitting: Oncology

## 2020-07-15 ENCOUNTER — Encounter: Payer: Self-pay | Admitting: Oncology

## 2020-07-15 DIAGNOSIS — Z170 Estrogen receptor positive status [ER+]: Secondary | ICD-10-CM

## 2020-07-15 MED ORDER — PALBOCICLIB 125 MG PO CAPS: 125.0000 mg | ORAL_CAPSULE | Freq: Every day | ORAL | 6 refills | Status: DC

## 2020-07-15 NOTE — Progress Notes (Signed)
From review of the records, [facility] oncology, 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

## 2020-07-15 NOTE — Progress Notes (Signed)
(  1) status post right breast biopsy 02/18/2005 for invasive ductal carcinoma, grade 2, estrogen receptor positive, progesterone receptor focally positive, MIB-110%, HER-2 unable to be performed on the sample  (2) status post docetaxel, doxorubicin and cyclophosphamide chemotherapy given neoadjuvantly x6  (3) status post right lumpectomy and axillary lymph node dissection 07/08/2005 for a ypT1c ypN2 invasive ductal carcinoma, grade 2, anatomic stage IIIa, prognostic stage IIa  (a) a total of 11 right axillary lymph nodes removed, 5+  (b) HER-2 was negative by immunohistochemistry on the final pathology  (4) status post adjuvant radiation  (5) prescribed letrozole, tolerated poorly, taken briefly

## 2020-07-15 NOTE — Progress Notes (Unsigned)
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  From her report, this appears to have been about 3 cm and to have involved 11 of the 13 right axillary lymph nodes involved.  She was treated at [facility] by Dr.  She was treated under Dr Epimenio Foot in [facility] with lumpectomy, chemotherapy (TAC x6) , and radiation therapy.  She then tried Femara which she did not tolerate and no further antiestrogens were prescribed.  She presented to the ED yesterday, 06/15/2020, with diffuse back, chest, and abdominal pain, as well as unintentional weight loss. Chest x-ray performed at that time showed: concern for bone metastasis with pathologic fracture of left 5th rib.  She proceeded to chest, abdomen, pelvis CT that day, which showed: multifocal predominantly blastic-appearing osseous metastatic disease involving the thoracic and lumbar spine, multiple ribs most prominently at the left fifth through seventh ribs, and the right sacral ala; nonspecific mixed solid nodules and ill-defined ground-glass opacities in the left upper lobe measuring up to 7 mm and 6 mm in size; mild distal small bowel fecalization without evidence of obstruction.  Of note, her most recent mammogram on file is a bilateral diagnostic scan from 08/11/2018. At that time, she presented with a burning sensation to the inferior right breast. Mammogram showed: breast density category B; no mammographic  evidence of malignancy in the bilateral breasts.   The patient's subsequent history is as detailed below.   INTERVAL HISTORY: Catherine Tyler returns today for follow-up and treatment of her stage IV cancer. She was last seen here on 07/07/2020.  This is the first visit at which she is not accompanied by her [name].  She says she needed to give [place] day off.   Lab Results  Component Value Date   CA2729 400.7 (H) 06/20/2020   Lab Results  Component Value Date   CEA1 1,129.20 (H) 06/20/2020   She is scheduled for a head CT scan on 07/14/2020.  We also have orders for nna scan which has not yet been scheduled  REVIEW OF SYSTEMS: Layana is not doing well today she says.  She has significant pain.  Yesterday she did a lot of activities around the house and she thinks she is pending for today.  Yesterday she had 3 MSIR.  She says she took the Tylenol/Aleve combination twice.  This morning she took half a morphine tablet and then repeated that half an hour later and then took a morphine tablet at 11.  She is moderately constipated.  PAST MEDICAL HISTORY: Past Medical History:  Diagnosis Date  . Cancer ([place])   . Personal history of chemotherapy   . Personal history of radiation therapy     PAST SURGICAL HISTORY: Past Surgical History:  Procedure Laterality Date  . BREAST LUMPECTOMY    . BREAST SURGERY      FAMILY HISTORY: Family History  Problem Relation Age of Onset  . Hyperlipidemia Mother   . Heart failure Mother   The patient's father died  at age 35 from a myocardial infarction.  As far as the patient knows there is no history of cancer on that side of the family.  The patient's mother died at age 68 from renal failure.  The patient is not aware of any cancer on that side of the family.  The patient herself has 5 sisters, no brothers.  1 sister Catherine Tyler) had lung cancer.   GYNECOLOGIC HISTORY:  No LMP recorded. Patient is postmenopausal. Menarche: 64 years old Age at first  live birth: 64 years old [place] P 1 LMP status post hysterectomy at age 64 secondary to fibroids; status post remote conization for in situ cervical carcinoma BSO?    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  She lives by herself with her dog Catherine Tyler who is a rescue.  The patient has been divorced 13 years as of June 2021.  Her son Catherine Tyler lives in [place] where he works in Architect.  The patient has 1 grandchild.  She attends [employer].  ADVANCED DIRECTIVES: To be discussed   HEALTH MAINTENANCE: Social History   Tobacco Use  . Smoking status: Current Some Day Smoker  . Smokeless tobacco: Never Used  Vaping Use  . Vaping Use: Never used  Substance Use Topics  . Alcohol use: Yes    Comment: on occasion  . Drug use: Never     Colonoscopy: Never  PAP: 07/2018, negative  Bone density: Never   Allergies  Allergen Reactions  . Sulfa Antibiotics Nausea And Vomiting  . Tape Other (See Comments)    Takes off skin    Current Outpatient Medications  Medication Sig Dispense Refill  . acetaminophen (TYLENOL) 500 MG tablet Take 1 tablet (500 mg total) by mouth with breakfast, with lunch, and with evening meal. Take together with aleve 220 mg 30 tablet 0  . bisacodyl (DULCOLAX) 10 MG suppository Place 1 suppository (10 mg total) rectally as needed for moderate constipation. 12 suppository 0  . docusate sodium (COLACE) 100 MG capsule Take 2 capsules (200 mg total) by mouth 2 (two) times daily as needed for mild constipation. 80 capsule 0  . linaclotide (LINZESS) 72 MCG capsule Take 1 capsule (72 mcg total) by mouth daily before breakfast. 30 capsule 0  . lisinopril (ZESTRIL) 10 MG tablet Take 1 tablet (10 mg total) by mouth daily. 30 tablet 11  . morphine (MSIR) 15 MG tablet Take 1 tablet (15 mg total) by mouth every 6 (six) hours as needed for severe pain. 60 tablet 0  . naproxen sodium (ALEVE) 220 MG tablet  Take 1 tablet (220 mg total) by mouth with breakfast, with lunch, and with evening meal. 90 tablet 4  . ondansetron (ZOFRAN ODT) 4 MG disintegrating tablet Take 1 tablet (4 mg total) by mouth every 8 (eight) hours as needed for nausea or vomiting. 20 tablet 0  . polyethylene glycol (MIRALAX / GLYCOLAX) 17 g packet Take 17 g by mouth daily.     No current facility-administered medications for this visit.    OBJECTIVE: White woman who appears uncomfortable  There were no vitals filed for this visit. Wt Readings from Last 3 Encounters:  07/13/20 156 lb 1.6 oz (70.8 kg)  07/07/20 157 lb 4.8 oz (71.4 kg)  06/16/20 159 lb 4.8 oz (72.3 kg)   There is no height or weight on file to calculate BMI.    ECOG FS:1 - Symptomatic but  completely ambulatory  Ocular: Sclerae unicteric, pupils round and equal Ear-nose-throat: Wearing a mask Lymphatic: No cervical or supraclavicular adenopathy Lungs no rales or rhonchi Heart regular rate and rhythm Abd soft, nontender, positive bowel sounds MSK no focal spinal tenderness, no joint edema Neuro: non-focal, well-oriented, appropriate affect Breasts: Status post right lumpectomy followed by radiation with no evidence of local recurrence.  The left breast is benign.  Both axillae are benign.  LAB RESULTS:  CMP     Component Value Date/Time   NA 138 07/13/2020 1250   NA 140 09/02/2018 1106   K 3.9 07/13/2020 1250   CL 103 07/13/2020 1250   CO2 27 07/13/2020 1250   GLUCOSE 120 (H) 07/13/2020 1250   BUN 13 07/13/2020 1250   BUN 22 09/02/2018 1106   CREATININE 0.73 07/13/2020 1250   CALCIUM 9.3 07/13/2020 1250   PROT 6.9 07/13/2020 1250   ALBUMIN 3.6 07/13/2020 1250   AST 28 07/13/2020 1250   ALT 23 07/13/2020 1250   ALKPHOS 335 (H) 07/13/2020 1250   BILITOT 0.4 07/13/2020 1250   GFRNONAA >60 07/13/2020 1250   GFRAA >60 07/13/2020 1250    No results found for: TOTALPROTELP, ALBUMINELP, A1GS, A2GS, BETS, BETA2SER, GAMS, MSPIKE, SPEI  Lab  Results  Component Value Date   WBC 7.6 07/13/2020   NEUTROABS 4.5 07/13/2020   HGB 13.8 07/13/2020   HCT 41.5 07/13/2020   MCV 89.1 07/13/2020   PLT 248 07/13/2020    No results found for: LABCA2  No components found for: [id number]  No results for input(s): INR in the last 168 hours.  No results found for: LABCA2  No results found for: CAN199  No results found for: TIW580  No results found for: DXI338  Lab Results  Component Value Date   CA2729 400.7 (H) 06/20/2020    No components found for: HGQUANT  Lab Results  Component Value Date   CEA1 1,129.20 (H) 06/20/2020   /  CEA ([facility])  Date Value Ref Range Status  06/20/2020 1,129.20 (H) 0.00 - 5.00 ng/mL Final    Comment:    (NOTE) This test was performed using Architect's Chemiluminescent Microparticle Immunoassay. Values obtained from different assay methods cannot be used interchangeably. Please note that 5-10% of patients who smoke may see CEA levels up to 6.9 ng/mL. Performed at [facility], [place] [address], [place], [place] [address]      No results found for: AFPTUMOR  No results found for: CHROMOGRNA  No results found for: KPAFRELGTCHN, LAMBDASER, KAPLAMBRATIO (kappa/lambda light chains)  No results found for: HGBA, HGBA2QUANT, HGBFQUANT, HGBSQUAN (Hemoglobinopathy evaluation)   No results found for: LDH  No results found for: IRON, TIBC, IRONPCTSAT (Iron and TIBC)  No results found for: FERRITIN  Urinalysis    Component Value Date/Time   COLORURINE STRAW (A) 04/27/2020 0928   APPEARANCEUR CLEAR 04/27/2020 0928   LABSPEC 1.015 05/30/2020 1402   PHURINE 6.0 05/30/2020 1402   GLUCOSEU NEGATIVE 05/30/2020 1402   HGBUR NEGATIVE 05/30/2020 1402   BILIRUBINUR NEGATIVE 05/30/2020 1402   KETONESUR NEGATIVE 05/30/2020 1402   PROTEINUR NEGATIVE 05/30/2020 1402   UROBILINOGEN 0.2 05/30/2020 1402   NITRITE NEGATIVE 05/30/2020 1402   LEUKOCYTESUR NEGATIVE  05/30/2020 1402     STUDIES: CT Chest W Contrast  Addendum Date: 06/15/2020   ADDENDUM REPORT: 06/15/2020 20:14 ADDENDUM: Additional impression point: Nodule in the right breast with punctate calcifications, recommend correlation with recent mammography or further outpatient mammographic assessment if not recently  performed. Electronically Signed   By: Lovena Le M.D.   On: 06/15/2020 20:14   Result Date: 06/15/2020 CLINICAL DATA:  Breast cancer with constipation and bone pain EXAM: CT CHEST, ABDOMEN, AND PELVIS WITH CONTRAST TECHNIQUE: Multidetector CT imaging of the chest, abdomen and pelvis was performed following the standard protocol during bolus administration of intravenous contrast. CONTRAST:  142m OMNIPAQUE IOHEXOL 300 MG/ML  SOLN COMPARISON:  CT renal colic 067/34/1937 CT abdomen pelvis 11/10/2018 FINDINGS: CT CHEST FINDINGS Cardiovascular: Normal heart size. No pericardial effusion. Atherosclerotic plaque within the normal caliber aorta. Atheromatous plaque at the origins of the great vessels which branch normally from the aortic arch. Proximal great vessels are otherwise unremarkable. Central pulmonary arteries are normal caliber. No large central or lobar filling defects on this non tailored examination of the pulmonary arteries. Mediastinum/Nodes: No mediastinal fluid or gas. Normal thyroid gland and thoracic inlet. No acute abnormality of the trachea or esophagus. No worrisome mediastinal, hilar or axillary adenopathy. Lungs/Pleura: Subpleural reticular changes along the right anterior upper and middle lobes may be related to prior radiation therapy some dependent atelectatic changes are seen posteriorly. Biapical pleuroparenchymal scarring. Some paraseptal emphysematous change versus bullous disease noted in the bilateral apices. No consolidative opacities, convincing features of edema, pneumothorax or effusion. Solid 3 mm nodule seen in the superior segment left lower lobe (5/73) solid 5  mm nodule in the right apex (5/19). Ill-defined ground-glass opacities present in the left upper lobe measuring up to 7 mm (5/31) and 6 mm (5/19) in size. Musculoskeletal: There are blastic appearing lesions involving the T4, T5, T6 vertebral bodies with extension into the posterior elements, smaller lesion at the T3 level as well. There are mixed sclerotic lesions throughout multiple thoracic ribs including a more focally sclerotic and expansile lesions with periosteal thickening and possible soft tissue involvement at the left posterior fifth, left anterior seventh and more diffusely throughout the left sixth rib. Few macro calcifications seen in the right breast as well as a more stippled calcification within a small 10 mm breast mass at the 9 o'clock position. CT ABDOMEN PELVIS FINDINGS Hepatobiliary: Diffuse hepatic hypoattenuation compatible with hepatic steatosis. No focal liver abnormality is seen. No gallstones, gallbladder wall thickening, or biliary dilatation. Pancreas: Unremarkable. No pancreatic ductal dilatation or surrounding inflammatory changes. Spleen: Normal in size without focal abnormality. Adrenals/Urinary Tract: Lobular thickening of the adrenal glands similar to comparison. Previously characterized as probable hyperplasia. Kidneys enhance and excrete symmetrically. Few punctate nonobstructing calculi present in both kidneys. No obstructive urolithiasis or hydronephrosis. Urinary bladder is unremarkable. Stomach/Bowel: Distal esophagus, stomach and duodenal sweep are unremarkable. No small bowel wall thickening or dilatation. Mild distal small bowel fecalization without evidence of obstruction. A normal appendix is visualized. No colonic dilatation or wall thickening. Vascular/Lymphatic: Atherosclerotic calcifications throughout the abdominal aorta and branch vessels. No aneurysm or ectasia. No enlarged abdominopelvic lymph nodes. Some irregular fusiform ectasia of the infrarenal abdominal  aorta but with maximal diameter of 2.1 cm. Reproductive: Uterus is surgically absent. No concerning adnexal lesions. No concerning adnexal lesions. Other: No abdominopelvic free fluid or free gas. No bowel containing hernias. Musculoskeletal: Sclerotic involvement of the right sacral ala compatible with additional site of bony metastatic disease. No other convincing sites of IMPRESSION: 1. Multifocal predominantly blastic appearing osseous metastatic disease involving the thoracic and lumbar spine, multiple ribs most prominently at the left fifth through seventh ribs, and the right sacral ala. Consider outpatient bone scan for further evaluation and detection additional metastatic foci.  2. Mixed solid nodules and ill-defined ground-glass opacities in the left upper lobe measuring up to 7 mm and 6 mm in size, nonspecific possibly reflecting infectious or inflammatory change, primary lung process, or metastatic disease. Given the patient's history of known primary malignancy, typical Fleischner Society criteria guidelines do not apply. Consider three-month interval follow-up to establish stability or assess for interval growth. 3. Mild distal small bowel fecalization without evidence of obstruction. Correlate for slowed intestinal transit. No evidence of bowel obstruction or large colonic stool burden. 4. Hepatic steatosis. 5. Lobular thickening of the adrenal glands similar to comparison, Previously characterized as probable hyperplasia. 6. Bilateral nonobstructing nephrolithiasis. 7. Aortic Atherosclerosis (ICD10-I70.0). Electronically Signed: By: Lovena Le M.D. On: 06/15/2020 19:40   CT ABDOMEN PELVIS W CONTRAST  Addendum Date: 06/15/2020   ADDENDUM REPORT: 06/15/2020 20:14 ADDENDUM: Additional impression point: Nodule in the right breast with punctate calcifications, recommend correlation with recent mammography or further outpatient mammographic assessment if not recently performed. Electronically Signed    By: Lovena Le M.D.   On: 06/15/2020 20:14   Result Date: 06/15/2020 CLINICAL DATA:  Breast cancer with constipation and bone pain EXAM: CT CHEST, ABDOMEN, AND PELVIS WITH CONTRAST TECHNIQUE: Multidetector CT imaging of the chest, abdomen and pelvis was performed following the standard protocol during bolus administration of intravenous contrast. CONTRAST:  179m OMNIPAQUE IOHEXOL 300 MG/ML  SOLN COMPARISON:  CT renal colic 065/99/3570 CT abdomen pelvis 11/10/2018 FINDINGS: CT CHEST FINDINGS Cardiovascular: Normal heart size. No pericardial effusion. Atherosclerotic plaque within the normal caliber aorta. Atheromatous plaque at the origins of the great vessels which branch normally from the aortic arch. Proximal great vessels are otherwise unremarkable. Central pulmonary arteries are normal caliber. No large central or lobar filling defects on this non tailored examination of the pulmonary arteries. Mediastinum/Nodes: No mediastinal fluid or gas. Normal thyroid gland and thoracic inlet. No acute abnormality of the trachea or esophagus. No worrisome mediastinal, hilar or axillary adenopathy. Lungs/Pleura: Subpleural reticular changes along the right anterior upper and middle lobes may be related to prior radiation therapy some dependent atelectatic changes are seen posteriorly. Biapical pleuroparenchymal scarring. Some paraseptal emphysematous change versus bullous disease noted in the bilateral apices. No consolidative opacities, convincing features of edema, pneumothorax or effusion. Solid 3 mm nodule seen in the superior segment left lower lobe (5/73) solid 5 mm nodule in the right apex (5/19). Ill-defined ground-glass opacities present in the left upper lobe measuring up to 7 mm (5/31) and 6 mm (5/19) in size. Musculoskeletal: There are blastic appearing lesions involving the T4, T5, T6 vertebral bodies with extension into the posterior elements, smaller lesion at the T3 level as well. There are mixed  sclerotic lesions throughout multiple thoracic ribs including a more focally sclerotic and expansile lesions with periosteal thickening and possible soft tissue involvement at the left posterior fifth, left anterior seventh and more diffusely throughout the left sixth rib. Few macro calcifications seen in the right breast as well as a more stippled calcification within a small 10 mm breast mass at the 9 o'clock position. CT ABDOMEN PELVIS FINDINGS Hepatobiliary: Diffuse hepatic hypoattenuation compatible with hepatic steatosis. No focal liver abnormality is seen. No gallstones, gallbladder wall thickening, or biliary dilatation. Pancreas: Unremarkable. No pancreatic ductal dilatation or surrounding inflammatory changes. Spleen: Normal in size without focal abnormality. Adrenals/Urinary Tract: Lobular thickening of the adrenal glands similar to comparison. Previously characterized as probable hyperplasia. Kidneys enhance and excrete symmetrically. Few punctate nonobstructing calculi present in both kidneys. No obstructive  urolithiasis or hydronephrosis. Urinary bladder is unremarkable. Stomach/Bowel: Distal esophagus, stomach and duodenal sweep are unremarkable. No small bowel wall thickening or dilatation. Mild distal small bowel fecalization without evidence of obstruction. A normal appendix is visualized. No colonic dilatation or wall thickening. Vascular/Lymphatic: Atherosclerotic calcifications throughout the abdominal aorta and branch vessels. No aneurysm or ectasia. No enlarged abdominopelvic lymph nodes. Some irregular fusiform ectasia of the infrarenal abdominal aorta but with maximal diameter of 2.1 cm. Reproductive: Uterus is surgically absent. No concerning adnexal lesions. No concerning adnexal lesions. Other: No abdominopelvic free fluid or free gas. No bowel containing hernias. Musculoskeletal: Sclerotic involvement of the right sacral ala compatible with additional site of bony metastatic disease. No  other convincing sites of IMPRESSION: 1. Multifocal predominantly blastic appearing osseous metastatic disease involving the thoracic and lumbar spine, multiple ribs most prominently at the left fifth through seventh ribs, and the right sacral ala. Consider outpatient bone scan for further evaluation and detection additional metastatic foci. 2. Mixed solid nodules and ill-defined ground-glass opacities in the left upper lobe measuring up to 7 mm and 6 mm in size, nonspecific possibly reflecting infectious or inflammatory change, primary lung process, or metastatic disease. Given the patient's history of known primary malignancy, typical Fleischner Society criteria guidelines do not apply. Consider three-month interval follow-up to establish stability or assess for interval growth. 3. Mild distal small bowel fecalization without evidence of obstruction. Correlate for slowed intestinal transit. No evidence of bowel obstruction or large colonic stool burden. 4. Hepatic steatosis. 5. Lobular thickening of the adrenal glands similar to comparison, Previously characterized as probable hyperplasia. 6. Bilateral nonobstructing nephrolithiasis. 7. Aortic Atherosclerosis (ICD10-I70.0). Electronically Signed: By: Lovena Le M.D. On: 06/15/2020 19:40     ELIGIBLE FOR AVAILABLE RESEARCH PROTOCOL: no  ASSESSMENT: 64 y.o. [place] woman   (1) status post right breast biopsy 02/18/2005 for an invasive ductal carcinoma, grade 2, estrogen receptor positive, progesterone receptor focally positive, with an MIB-1 of 10% and HER-2/neu unable to perform.   (2) treated neoadjuvantly with docetaxel, doxorubicin, and cyclophosphamide x6  (3) underwent right lumpectomy and axillary lymph node dissection 07/08/2005 for a ypT1c ypN2, anatomic stage 3A, prognostic stage IIa, with close but negative margins  (a) a total of 11 lymph nodes were removed, 5+  (b) HER-2 was negative by immunohistochemistry on the final  pathology  (4) status post adjuvant radiation  (5) prescribed letrozole but poorly tolerated, taken briefly  METASTATIC DISEASE (6) CT scans of the chest abdomen and pelvis 06/15/2020 show widespread bony metastatic disease, no definitive visceral disease  (a) bone marrow biopsy  06/20/2020 nondiagnostic  (b) F 18 estradiol PET scan (cerianna) pending  (3) if cancer is estrogen receptor positive, as expected, will start fulvestrant/palbociclib  PLAN: Catherine Tyler's bone marrow biopsy was nondiagnostic.  We are waiting on the' F 18 estradiol scan to document estrogen receptor positivity in the bone lesions which will clench the diagnosis.  She does have an elevated CA 27-29 but also has a very elevated CEA, which is not that common in breast cancer.  Today she received her first denosumab/Xgeva dose and her first fulvestrant dose.  She will receive her next fulvestrant in 2 weeks and her next denosumab/Xgeva in 4 weeks.  I went over pain management with her again and again gave her the information in writing.  She really has to take the Tylenol and Aleve 3 times a day every single day as a baseline and not occasionally.  She then can take the  morphine as needed.  We also reviewed the bowel prophylaxis regimen and she is going to take 3 stool softeners twice daily and MiraLAX daily and use a laxative every third day if she needs it.  I urged her to keep these instructions to the letter so she can feel a little bit more comfortable  Otherwise she will see [place] again in 2 weeks.  She knows to call for any other issue that may develop before then.  Total encounter time 35 minutes.Sarajane Jews C. Marlowe Lawes, MD 07/15/2020 5:42 PM Medical Oncology and Hematology [facility] [place], [place] [address] Tel. [phone]    Fax. [phone]   I, Jacqualyn Posey am acting as a Education administrator for [name], MD.   I, Lurline Del MD, have reviewed the above documentation for  accuracy and completeness, and I agree with the above.    *Total Encounter Time as defined by the Centers for Medicare and Medicaid Services includes, in addition to the face-to-face time of a patient visit (documented in the note above) non-face-to-face time: obtaining and reviewing outside history, ordering and reviewing medications, tests or procedures, care coordination (communications with other health care professionals or caregivers) and documentation in the medical record.

## 2020-07-17 ENCOUNTER — Telehealth: Payer: Self-pay

## 2020-07-17 ENCOUNTER — Telehealth: Payer: Self-pay | Admitting: Pharmacist

## 2020-07-17 DIAGNOSIS — Z170 Estrogen receptor positive status [ER+]: Secondary | ICD-10-CM

## 2020-07-17 MED ORDER — PALBOCICLIB 125 MG PO TABS: 125.0000 mg | ORAL_TABLET | Freq: Every day | ORAL | 6 refills | Status: DC

## 2020-07-17 NOTE — Telephone Encounter (Signed)
Oral Oncology Patient Advocate Encounter  After completing a benefits investigation, patient has family planning Medicaid and Leslee Home is not covered.  We are in the process of an application for manufacturer assistance.  [place] Patient [place] Phone [phone] Fax [phone] 07/17/2020 10:02 AM

## 2020-07-17 NOTE — Telephone Encounter (Signed)
Oral Oncology Pharmacist Encounter  Received new prescription for Ibrance  (palbociclib) for the treatment of likely metastatic breast cancer in conjunction with denosumab and fulvestrant, planned duration until disease progression or unacceptable drug toxicity.  [place] PET imaging pending. Confirmed with Dr. Jana Hakim that we will hold starting Ibrance until imaging results confirm HR positive disease.   Labs from 07/13/2020 assessed, all wnl except elevated alkaline phosphatase of 335 which can be attributed to bone metastases rather than liver injury especially given normal ALT and AST. Prescription dose and frequency assessed.   Current medication list in Epic reviewed, no DDIs with Ibrance identified.  Prescription has been e-scribed to the [facility] for initial fill with Ibrance Trial card, pending manufacturer assistance, to ensure prescription is ready when appropriate.   Oral Oncology Clinic will continue to follow for manufacture assistance, initial counseling and start date.  Eddie Candle, PharmD PGY2 Hematology/Oncology Pharmacy Resident Oral Chemotherapy [facility] 07/17/2020 9:46 AM

## 2020-07-19 ENCOUNTER — Encounter: Payer: Self-pay | Admitting: Pharmacy Technician

## 2020-07-19 NOTE — Progress Notes (Signed)
Patient has been approved for drug assistance by [employer] for [employer]. The enrollment period is from 07/18/20-07/18/21 based on self pay. First DOS covered is 07/13/20.

## 2020-07-20 ENCOUNTER — Other Ambulatory Visit: Payer: Self-pay

## 2020-07-20 ENCOUNTER — Inpatient Hospital Stay (HOSPITAL_BASED_OUTPATIENT_CLINIC_OR_DEPARTMENT_OTHER): Payer: Medicaid Other | Admitting: Adult Health

## 2020-07-20 ENCOUNTER — Inpatient Hospital Stay: Payer: Medicaid Other

## 2020-07-20 VITALS — BP 120/77 | HR 75 | Temp 98.7°F | Resp 18 | Ht 65.0 in | Wt 159.3 lb

## 2020-07-20 DIAGNOSIS — C50811 Malignant neoplasm of overlapping sites of right female breast: Secondary | ICD-10-CM | POA: Diagnosis not present

## 2020-07-20 DIAGNOSIS — Z170 Estrogen receptor positive status [ER+]: Secondary | ICD-10-CM

## 2020-07-20 DIAGNOSIS — C7951 Secondary malignant neoplasm of bone: Secondary | ICD-10-CM

## 2020-07-20 MED ORDER — LUBIPROSTONE 24 MCG PO CAPS: 24.0000 ug | ORAL_CAPSULE | Freq: Two times a day (BID) | ORAL | 3 refills | Status: DC

## 2020-07-20 MED ORDER — MORPHINE SULFATE 15 MG PO TABS: 15.0000 mg | ORAL_TABLET | Freq: Four times a day (QID) | ORAL | 0 refills | Status: DC | PRN

## 2020-07-20 NOTE — Progress Notes (Signed)
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  From her report, this appears to have been about 3 cm and to have involved 11 of the 13 right axillary lymph nodes involved.  She was treated at [facility] by Dr.  She was treated under Dr Epimenio Foot in [facility] with lumpectomy, chemotherapy (TAC x6) , and radiation therapy.  She then tried Femara which she did not tolerate and no further antiestrogens were prescribed.  She presented to the ED yesterday, 06/15/2020, with diffuse back, chest, and abdominal pain, as well as unintentional weight loss. Chest x-ray performed at that time showed: concern for bone metastasis with pathologic fracture of left 5th rib.  She proceeded to chest, abdomen, pelvis CT that day, which showed: multifocal predominantly blastic-appearing osseous metastatic disease involving the thoracic and lumbar spine, multiple ribs most prominently at the left fifth through seventh ribs, and the right sacral ala; nonspecific mixed solid nodules and ill-defined ground-glass opacities in the left upper lobe measuring up to 7 mm and 6 mm in size; mild distal small bowel fecalization without evidence of obstruction.  Of note, her most recent mammogram on file is a bilateral diagnostic scan from 08/11/2018. At that time, she presented with a burning sensation to the inferior right breast. Mammogram showed: breast density category B; no mammographic  evidence of malignancy in the bilateral breasts.   The patient's subsequent history is as detailed below.   INTERVAL HISTORY: Catherine Tyler returns today for follow-up and treatment of her stage IV cancer. She is here accompanied by her sister Catherine Tyler.  She is due to start treatment with Fulvestrant injections given every 2 weeks initially x 3 which is a loading dose, and then every 4 weeks thereafter.    She has not yet had the [place] PET scan, and this is scheduled for next week.    REVIEW OF SYSTEMS: Catherine Tyler notes that her pain is controlled with Morphine Sulfate IR tablets.  She is taking these about 3-4 times per day.  She needs a refill.  She feels the medication keeps her able to do things and improves her quality of life.  She notes that her bone pain improves with this, however she has significant constipation.  She was previously taking Linzess samples which worked Recruitment consultant that Dr. Cristina Tyler had, however the prescription cost was $500 and that is unattainable for her.  She wants to know if there is anything else she can do for her constipation.  She has already maxed out on her OTC bowel regimen with BID stool softeners and daily Miralax.   Catherine Tyler has also been prescribed Palbociclib, however was instructed not to start taking this previously by Dr. Jana Tyler.  She denies any fever, chills, cough, shortness of breath, bladder changes, headaches, vision issues.  A detailed ROS was otherwise non contributory.    PAST MEDICAL HISTORY: Past Medical History:  Diagnosis Date  . Cancer ([place])   . Personal history of chemotherapy   . Personal history of  radiation therapy     PAST SURGICAL HISTORY: Past Surgical History:  Procedure Laterality Date  . BREAST LUMPECTOMY    . BREAST SURGERY      FAMILY HISTORY: Family History  Problem Relation Age of Onset  . Hyperlipidemia Mother   . Heart failure Mother   The patient's father died at age 29 from a myocardial infarction.  As far as the  patient knows there is no history of cancer on that side of the family.  The patient's mother died at age 51 from renal failure.  The patient is not aware of any cancer on that side of the family.  The patient herself has 5 sisters, no brothers.  1 sister Catherine Tyler) had lung cancer.   GYNECOLOGIC HISTORY:  No LMP recorded. Patient is postmenopausal. Menarche: 64 years old Age at first live birth: 64 years old [place] P 1 LMP status post hysterectomy at age 31 secondary to fibroids; status post remote conization for in situ cervical carcinoma BSO?    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  She lives by herself with her dog Catherine Tyler who is a rescue.  The patient has been divorced 13 years as of June 2021.  Her son Catherine Tyler lives in [place] where he works in Architect.  The patient has 1 grandchild.  She attends [employer].  ADVANCED DIRECTIVES: To be discussed   HEALTH MAINTENANCE: Social History   Tobacco Use  . Smoking status: Current Some Day Smoker  . Smokeless tobacco: Never Used  Vaping Use  . Vaping Use: Never used  Substance Use Topics  . Alcohol use: Yes    Comment: on occasion  . Drug use: Never     Colonoscopy: Never  PAP: 07/2018, negative  Bone density: Never   Allergies  Allergen Reactions  . Sulfa Antibiotics Nausea And Vomiting  . Tape Other (See Comments)    Takes off skin    Current Outpatient Medications  Medication Sig Dispense Refill  . acetaminophen (TYLENOL) 500 MG tablet Take 1 tablet (500 mg total) by mouth with breakfast, with lunch, and with evening meal. Take together with aleve 220 mg 30 tablet 0  . bisacodyl (DULCOLAX) 10 MG suppository Place 1 suppository (10 mg total) rectally as needed for moderate constipation. 12 suppository 0  . docusate sodium (COLACE) 100 MG capsule Take 2 capsules (200 mg total) by mouth 2 (two) times daily as needed for mild constipation. 80  capsule 0  . linaclotide (LINZESS) 72 MCG capsule Take 1 capsule (72 mcg total) by mouth daily before breakfast. 30 capsule 0  . lisinopril (ZESTRIL) 10 MG tablet Take 1 tablet (10 mg total) by mouth daily. 30 tablet 11  . morphine (MSIR) 15 MG tablet Take 1 tablet (15 mg total) by mouth every 6 (six) hours as needed for severe pain. 60 tablet 0  . naproxen sodium (ALEVE) 220 MG tablet Take 1 tablet (220 mg total) by mouth with breakfast, with lunch, and with evening meal. 90 tablet 4  . ondansetron (ZOFRAN ODT) 4 MG disintegrating tablet Take 1 tablet (4 mg total) by mouth every 8 (eight) hours as needed for nausea or vomiting. 20 tablet 0  . palbociclib (IBRANCE) 125 MG tablet Take 1 tablet (125 mg total) by mouth daily. Take for 21 days on, 7 days off, repeat every 28 days. Start 08/17/2020 21 tablet 6  . polyethylene glycol (MIRALAX /  GLYCOLAX) 17 g packet Take 17 g by mouth daily.     No current facility-administered medications for this visit.    OBJECTIVE: White woman who appears uncomfortable  There were no vitals filed for this visit. Wt Readings from Last 3 Encounters:  07/13/20 156 lb 1.6 oz (70.8 kg)  07/07/20 157 lb 4.8 oz (71.4 kg)  06/16/20 159 lb 4.8 oz (72.3 kg)   There is no height or weight on file to calculate BMI.    ECOG FS:1 - Symptomatic but completely ambulatory GENERAL: Patient is a well appearing female in no acute distress HEENT:  Sclerae anicteric. Mask in place. Neck is supple.  NODES:  No cervical, supraclavicular, or axillary lymphadenopathy palpated.  BREAST EXAM:  Deferred. LUNGS:  Clear to auscultation bilaterally.  No wheezes or rhonchi. HEART:  Regular rate and rhythm. No murmur appreciated. ABDOMEN:  Soft, nontender.  Positive, normoactive bowel sounds. No organomegaly palpated. MSK:  No focal spinal tenderness to palpation. Full range of motion bilaterally in the upper extremities. EXTREMITIES:  No peripheral edema.   SKIN:  Clear with no obvious  rashes or skin changes. No nail dyscrasia. NEURO:  Nonfocal. Well oriented.  Appropriate affect.    LAB RESULTS:  CMP     Component Value Date/Time   NA 138 07/13/2020 1250   NA 140 09/02/2018 1106   K 3.9 07/13/2020 1250   CL 103 07/13/2020 1250   CO2 27 07/13/2020 1250   GLUCOSE 120 (H) 07/13/2020 1250   BUN 13 07/13/2020 1250   BUN 22 09/02/2018 1106   CREATININE 0.73 07/13/2020 1250   CALCIUM 9.3 07/13/2020 1250   PROT 6.9 07/13/2020 1250   ALBUMIN 3.6 07/13/2020 1250   AST 28 07/13/2020 1250   ALT 23 07/13/2020 1250   ALKPHOS 335 (H) 07/13/2020 1250   BILITOT 0.4 07/13/2020 1250   GFRNONAA >60 07/13/2020 1250   GFRAA >60 07/13/2020 1250    No results found for: TOTALPROTELP, ALBUMINELP, A1GS, A2GS, BETS, BETA2SER, GAMS, MSPIKE, SPEI  Lab Results  Component Value Date   WBC 7.6 07/13/2020   NEUTROABS 4.5 07/13/2020   HGB 13.8 07/13/2020   HCT 41.5 07/13/2020   MCV 89.1 07/13/2020   PLT 248 07/13/2020    No results found for: LABCA2  No components found for: UJWJXB147  No results for input(s): INR in the last 168 hours.  No results found for: LABCA2  No results found for: CAN199  No results found for: WGN562  No results found for: ZHY865  Lab Results  Component Value Date   CA2729 400.7 (H) 06/20/2020    No components found for: HGQUANT  Lab Results  Component Value Date   CEA1 1,129.20 (H) 06/20/2020   /  CEA ([facility]In [facility])  Date Value Ref Range Status  06/20/2020 1,129.20 (H) 0.00 - 5.00 ng/mL Final    Comment:    (NOTE) This test was performed using Architect's Chemiluminescent Microparticle Immunoassay. Values obtained from different assay methods cannot be used interchangeably. Please note that 5-10% of patients who smoke may see CEA levels up to 6.9 ng/mL. Performed at [facility], [place] [address] [place], [place] [address]      No results found for: AFPTUMOR  No results found for: CHROMOGRNA  No  results found for: KPAFRELGTCHN, LAMBDASER, KAPLAMBRATIO (kappa/lambda light chains)  No results found for: HGBA, HGBA2QUANT, HGBFQUANT, HGBSQUAN (Hemoglobinopathy evaluation)   No results found for: LDH  No results found for: IRON, TIBC, IRONPCTSAT (Iron  and TIBC)  No results found for: FERRITIN  Urinalysis    Component Value Date/Time   COLORURINE STRAW (A) 04/27/2020 0928   APPEARANCEUR CLEAR 04/27/2020 0928   LABSPEC 1.015 05/30/2020 1402   PHURINE 6.0 05/30/2020 1402   GLUCOSEU NEGATIVE 05/30/2020 1402   HGBUR NEGATIVE 05/30/2020 1402   BILIRUBINUR NEGATIVE 05/30/2020 1402   KETONESUR NEGATIVE 05/30/2020 1402   PROTEINUR NEGATIVE 05/30/2020 1402   UROBILINOGEN 0.2 05/30/2020 1402   NITRITE NEGATIVE 05/30/2020 1402   LEUKOCYTESUR NEGATIVE 05/30/2020 1402     STUDIES: CT Head W Wo Contrast  Result Date: 07/18/2020 CLINICAL DATA:  Breast cancer, evaluate for metastases. EXAM: CT HEAD WITHOUT AND WITH CONTRAST TECHNIQUE: Contiguous axial images were obtained from the base of the skull through the vertex without and with intravenous contrast CONTRAST:  56m OMNIPAQUE IOHEXOL 300 MG/ML  SOLN COMPARISON:  11/10/2018 head CT. FINDINGS: Brain: No acute infarct or intracranial hemorrhage. No mass lesion. No midline shift, ventriculomegaly or extra-axial fluid collection. No abnormal enhancement. Vascular: No hyperdense vessel. Bilateral carotid siphon atherosclerotic calcifications. Visualized vessels are patent. Skull: Negative for fracture or focal lesion. Sinuses/Orbits: Normal orbits. Clear paranasal sinuses. No mastoid effusion. Other: None. IMPRESSION: No evidence of intracranial metastases. Normal head CT. Electronically Signed   By: CPrimitivo GauzeM.D.   On: 07/18/2020 15:16     ELIGIBLE FOR AVAILABLE RESEARCH PROTOCOL: no  ASSESSMENT: 64y.o. [place] woman   (1) status post right lumpectomy and axillary lymph node dissection in 2007 for a stage III invasive  breast cancer, additional pathology details to be reviewed when available  (a) a total of 13 axillary lymph nodes were removed, 11 positive  (b) adjuvant chemotherapy consisted of docetaxel, doxorubicin and cyclophosphamide x6  (c) the patient received adjuvant radiation  (d) received letrozole briefly but did not tolerate it.  METASTATIC DISEASE (2) CT scans of the chest abdomen and pelvis 06/15/2020 shows widespread bony metastatic disease, no definitive visceral disease  (a) bone marrow biopsy  06/20/2020 nondiagnostic  (b) consider F 18 estradiol PET scan (cerianna) if bone marrow biopsy nondiagnostic  (3) if cancer is estrogen receptor positive, as expected, will start fulvestrant/palbociclib  PLAN: Catherine Tyler is doing moderately well.  She was supposed to start her Fulvestrant today, however Cerianna is scheduled for next week and Fulvestrant will interfere with those results.  Due to this, we will delay her Fulvestrant start.  We discussed common adverse effects of Palbociclib and Fulvestrant in detail, and she and her sister SLovey Newcomerhave a good understanding of these.    I refilled Catherine Tyler's Morphine for her pain.  She is still requiring this, and it is improving her quality of life.  PMP aware was reviewed and no red flags were noted.    She is having constipation.  I called our pharmacist and we spent a while trying to find her some relief.  First MBurman Nieveslooked into assistance for Linzess, which they do not have.  After discussion, she will start Amitiza 258mTID.  I reviewed this with her and sent it into her pharmacy.    Catherine Tyler will return in 2 weeks for labs, f/u, and her first Fulvestrant.  She knows to call for any questions that may arise between now and her next appointment.  We are happy to see her sooner if needed.  Total encounter time 45 minutes.* Wilber BihariNP 07/20/20 3:05 PM [facility] Oncology [facility]  [id number]  Tel. [phone]    Fax. [phone]    *Total Encounter Time as defined by the Centers for Medicare and Medicaid Services includes, in addition to the face-to-face time of a patient visit (documented in the note above) non-face-to-face time: obtaining and reviewing outside history, ordering and reviewing medications, tests or procedures, care coordination (communications with other health care professionals or caregivers) and documentation in the medical record.

## 2020-07-21 ENCOUNTER — Telehealth: Payer: Self-pay | Admitting: Adult Health

## 2020-07-21 NOTE — Telephone Encounter (Signed)
No 7/22 los. No changes made to pt's schedule.  

## 2020-07-22 ENCOUNTER — Encounter: Payer: Self-pay | Admitting: Adult Health

## 2020-07-27 ENCOUNTER — Ambulatory Visit: Payer: Medicaid Other

## 2020-07-27 ENCOUNTER — Ambulatory Visit: Payer: Medicaid Other | Admitting: Adult Health

## 2020-07-31 ENCOUNTER — Other Ambulatory Visit: Payer: Self-pay

## 2020-07-31 ENCOUNTER — Ambulatory Visit (HOSPITAL_COMMUNITY)
Admission: RE | Admit: 2020-07-31 | Discharge: 2020-07-31 | Disposition: A | Discharge status: Home / Self Care | Payer: Medicaid Other | Source: Ambulatory Visit | Attending: Oncology | Admitting: Oncology

## 2020-07-31 DIAGNOSIS — C7951 Secondary malignant neoplasm of bone: Secondary | ICD-10-CM | POA: Diagnosis present

## 2020-07-31 DIAGNOSIS — Z170 Estrogen receptor positive status [ER+]: Secondary | ICD-10-CM | POA: Insufficient documentation

## 2020-07-31 DIAGNOSIS — C50811 Malignant neoplasm of overlapping sites of right female breast: Secondary | ICD-10-CM | POA: Diagnosis present

## 2020-07-31 MED ORDER — FLUOROESTRADIOL F 18 4-100 MCI/ML IV SOLN
5.7000 | Freq: Once | INTRAVENOUS | Status: AC
  Administered 2020-07-31: 5.7 via INTRAVENOUS

## 2020-08-01 ENCOUNTER — Other Ambulatory Visit: Payer: Self-pay | Admitting: Oncology

## 2020-08-01 ENCOUNTER — Encounter: Payer: Self-pay | Admitting: Oncology

## 2020-08-03 ENCOUNTER — Ambulatory Visit: Payer: Medicaid Other

## 2020-08-03 ENCOUNTER — Telehealth: Payer: Self-pay | Admitting: Adult Health

## 2020-08-03 ENCOUNTER — Telehealth: Payer: Self-pay

## 2020-08-03 ENCOUNTER — Telehealth: Payer: Self-pay | Admitting: Pharmacist

## 2020-08-03 ENCOUNTER — Other Ambulatory Visit: Payer: Self-pay | Admitting: Oncology

## 2020-08-03 DIAGNOSIS — C7951 Secondary malignant neoplasm of bone: Secondary | ICD-10-CM

## 2020-08-03 MED ORDER — CAPECITABINE 500 MG PO TABS
1500.0000 mg | ORAL_TABLET | Freq: Two times a day (BID) | ORAL | 6 refills | Status: DC
Start: 2020-08-03 — End: 2020-11-30

## 2020-08-03 MED ORDER — MORPHINE SULFATE 15 MG PO TABS: 15.0000 mg | ORAL_TABLET | Freq: Four times a day (QID) | ORAL | 0 refills | Status: DC | PRN

## 2020-08-03 NOTE — Progress Notes (Signed)
Catherine Tyler it does not show uptake meaning that her tumor is now not estrogen sensitive.  We are going to go with capecitabine.  The order has been entered.

## 2020-08-03 NOTE — Telephone Encounter (Signed)
Called patient and discussed changing therapy from faslodex to Capecitabine.  I also refilled her Morphine.  We will see her on 08/17/20 to discuss her treatment further.

## 2020-08-03 NOTE — Telephone Encounter (Signed)
Oral Oncology Patient Advocate Encounter  After completing a benefits investigation, prior authorization for Xeloda is not required at this time through [facility].  Patient's copay is $0.     [place] Patient [place] Phone [phone] Fax [phone] 08/03/2020 3:57 PM

## 2020-08-04 NOTE — Telephone Encounter (Signed)
Oral Oncology Pharmacist Encounter  Received new prescription for Xeloda (capecitabine) for the treatment of stage IV, HR negative, HER-2 negative breast cancer in conjunction with denosumab, planned duration until disease progression or unacceptable drug toxicity.  Prescription dose and frequency assessed for appropriateness. OK for therapy initiation. Plan is for patient not to start Xeloda until after MD visit on 08/17/2020  CMP and CBC w/ Diff from 07/13/20 assessed, noted alk phos elevated (335 U/L), likely secondary to bone metastases, all other labs stable.  Current medication list in Epic reviewed, no relevant DDIs with Xeloda identified.  Evaluated chart and no patient barriers to medication adherence noted.   Prescription has been e-scribed to the [facility] for benefits analysis and approval.  Oral Oncology Clinic will continue to follow for insurance authorization, copayment issues, initial counseling and start date.  Leron Croak, PharmD, BCPS Hematology/Oncology Clinical Pharmacist [facility] [phone] 08/04/2020 10:56 AM

## 2020-08-10 ENCOUNTER — Other Ambulatory Visit: Payer: Medicaid Other

## 2020-08-10 ENCOUNTER — Ambulatory Visit: Payer: Medicaid Other

## 2020-08-10 MED FILL — CAPECITABINE 500 MG TABLET: 500 | 21 days supply | Qty: 84 | Fill #0

## 2020-08-10 NOTE — Telephone Encounter (Signed)
Oral Chemotherapy Pharmacist Encounter   Attempted to reach patient to provide update and offer for initial counseling on oral medication: Xeloda (capecitabine).  No answer.  Left voicemail for patient to call back to discuss details of medication acquisition and initial counseling session.  Leron Croak, PharmD, BCPS Hematology/Oncology Clinical Pharmacist [facility] [phone] 08/10/2020 2:14 PM

## 2020-08-10 NOTE — Telephone Encounter (Signed)
Oral Chemotherapy Pharmacist Encounter  I spoke with patient for overview of: Xeloda (capecitabine) for the treatment of stave IV, HR negative, HER-2 negative breast cancer, planned duration until disease progression or unacceptable drug toxicity  Counseled patient on administration, dosing, side effects, monitoring, drug-food interactions, safe handling, storage, and disposal.  Patient will take Xeloda 57m tablets, 3 tablets (15044m by mouth in AM and 3 tabs (150027mby mouth in PM, within 30 minutes of finishing meals, on days 1-14 of each 21 day cycle.   Xeloda start date: Patient knows not to start medication until MD advises at next appointment on 08/17/20. Patient stated she will bring medication with her to that appointment.   Adverse effects include but are not limited to: fatigue, decreased blood counts, GI upset, diarrhea, mouth sores, and hand-foot syndrome.  Patient has anti-emetic on hand and knows to take it if nausea develops.   Patient will obtain anti diarrheal and alert the office of 4 or more loose stools above baseline.  Reviewed with patient importance of keeping a medication schedule and plan for any missed doses. No barriers to medication adherence identified.  Medication education handout placed in mail for patient.  Medication reconciliation performed and medication/allergy list updated.  Patient informed the pharmacy will reach out 5-7 days prior to needing next fill of Xeloda to coordinate continued medication acquisition to prevent break in therapy.  All questions answered.  Ms. SmiCharloticed understanding and appreciation.   Patient knows to call the office with questions or concerns.  RebLeron CroakharmD, BCPS Hematology/Oncology Clinical Pharmacist [phone] [id number] 3:03 PM

## 2020-08-17 ENCOUNTER — Telehealth: Payer: Self-pay | Admitting: Adult Health

## 2020-08-17 ENCOUNTER — Inpatient Hospital Stay: Payer: Medicaid Other

## 2020-08-17 ENCOUNTER — Encounter: Payer: Self-pay | Admitting: Adult Health

## 2020-08-17 ENCOUNTER — Inpatient Hospital Stay (HOSPITAL_BASED_OUTPATIENT_CLINIC_OR_DEPARTMENT_OTHER): Payer: Medicaid Other | Admitting: Adult Health

## 2020-08-17 ENCOUNTER — Other Ambulatory Visit: Payer: Self-pay

## 2020-08-17 ENCOUNTER — Inpatient Hospital Stay: Payer: Medicaid Other | Attending: Oncology

## 2020-08-17 VITALS — BP 122/87 | HR 81 | Temp 97.3°F | Resp 18 | Ht 65.0 in | Wt 150.1 lb

## 2020-08-17 DIAGNOSIS — C7951 Secondary malignant neoplasm of bone: Secondary | ICD-10-CM | POA: Insufficient documentation

## 2020-08-17 DIAGNOSIS — Z170 Estrogen receptor positive status [ER+]: Secondary | ICD-10-CM | POA: Diagnosis not present

## 2020-08-17 DIAGNOSIS — C50911 Malignant neoplasm of unspecified site of right female breast: Secondary | ICD-10-CM | POA: Diagnosis present

## 2020-08-17 DIAGNOSIS — C50811 Malignant neoplasm of overlapping sites of right female breast: Secondary | ICD-10-CM | POA: Diagnosis not present

## 2020-08-17 DIAGNOSIS — F17210 Nicotine dependence, cigarettes, uncomplicated: Secondary | ICD-10-CM | POA: Diagnosis not present

## 2020-08-17 LAB — COMPREHENSIVE METABOLIC PANEL
ALT: 13 U/L (ref 0–44)
AST: 19 U/L (ref 15–41)
Albumin: 3.8 g/dL (ref 3.5–5.0)
Alkaline Phosphatase: 223 U/L — ABNORMAL HIGH (ref 38–126)
Anion gap: 5 (ref 5–15)
BUN: 11 mg/dL (ref 8–23)
CO2: 26 mmol/L (ref 22–32)
Calcium: 9.3 mg/dL (ref 8.9–10.3)
Chloride: 105 mmol/L (ref 98–111)
Creatinine, Ser: 0.72 mg/dL (ref 0.44–1.00)
GFR calc Af Amer: >60 mL/min (ref >60)
GFR calc non Af Amer: >60 mL/min (ref >60)
Glucose, Bld: 116 mg/dL — ABNORMAL HIGH (ref 70–99)
Potassium: 4.7 mmol/L (ref 3.5–5.1)
Sodium: 136 mmol/L (ref 135–145)
Total Bilirubin: 0.4 mg/dL (ref 0.3–1.2)
Total Protein: 7.2 g/dL (ref 6.5–8.1)

## 2020-08-17 LAB — CBC WITH DIFFERENTIAL/PLATELET
Abs Immature Granulocytes: 0.02 10*3/uL (ref 0.00–0.07)
Basophils Absolute: 0.1 10*3/uL (ref 0.0–0.1)
Basophils Relative: 1 %
Eosinophils Absolute: 0.1 10*3/uL (ref 0.0–0.5)
Eosinophils Relative: 2 %
HCT: 42.6 % (ref 36.0–46.0)
Hemoglobin: 13.9 g/dL (ref 12.0–15.0)
Immature Granulocytes: 0 %
Lymphocytes Relative: 26 %
Lymphs Abs: 2.1 10*3/uL (ref 0.7–4.0)
MCH: 28.6 pg (ref 26.0–34.0)
MCHC: 32.6 g/dL (ref 30.0–36.0)
MCV: 87.7 fL (ref 80.0–100.0)
Monocytes Absolute: 0.5 10*3/uL (ref 0.1–1.0)
Monocytes Relative: 7 %
Neutro Abs: 5.3 10*3/uL (ref 1.7–7.7)
Neutrophils Relative %: 64 %
Platelets: 245 10*3/uL (ref 150–400)
RBC: 4.86 MIL/uL (ref 3.87–5.11)
RDW: 13 % (ref 11.5–15.5)
WBC: 8.1 10*3/uL (ref 4.0–10.5)
nRBC: 0 % (ref 0.0–0.2)

## 2020-08-17 LAB — CEA (IN HOUSE-CHCC): CEA (CHCC-In House): 1489.04 ng/mL — ABNORMAL HIGH (ref 0.00–5.00)

## 2020-08-17 MED ORDER — MORPHINE SULFATE ER 15 MG PO TBCR: 15.0000 mg | EXTENDED_RELEASE_TABLET | Freq: Two times a day (BID) | ORAL | 0 refills | Status: DC

## 2020-08-17 MED ORDER — OMEPRAZOLE 40 MG PO CPDR: 40.0000 mg | DELAYED_RELEASE_CAPSULE | Freq: Every day | ORAL | 3 refills | Status: AC

## 2020-08-17 MED ORDER — MORPHINE SULFATE 15 MG PO TABS: 15.0000 mg | ORAL_TABLET | Freq: Four times a day (QID) | ORAL | 0 refills | Status: DC | PRN

## 2020-08-17 NOTE — Progress Notes (Addendum)
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  From her report, this appears to have been about 3 cm and to have involved 11 of the 13 right axillary lymph nodes involved.  She was treated at [facility] by Dr.  She was treated under Dr Epimenio Foot in [facility] with lumpectomy, chemotherapy (TAC x6) , and radiation therapy.  She then tried Femara which she did not tolerate and no further antiestrogens were prescribed.  She presented to the ED yesterday, 06/15/2020, with diffuse back, chest, and abdominal pain, as well as unintentional weight loss. Chest x-ray performed at that time showed: concern for bone metastasis with pathologic fracture of left 5th rib.  She proceeded to chest, abdomen, pelvis CT that day, which showed: multifocal predominantly blastic-appearing osseous metastatic disease involving the thoracic and lumbar spine, multiple ribs most prominently at the left fifth through seventh ribs, and the right sacral ala; nonspecific mixed solid nodules and ill-defined ground-glass opacities in the left upper lobe measuring up to 7 mm and 6 mm in size; mild distal small bowel fecalization without evidence of obstruction.  Of note, her most recent mammogram on file is a bilateral diagnostic scan from 08/11/2018. At that time, she presented with a burning sensation to the inferior right breast. Mammogram showed: breast density category B; no mammographic  evidence of malignancy in the bilateral breasts.   The patient's subsequent history is as detailed below.   INTERVAL HISTORY: Debe returns today for follow-up and treatment of her stage IV cancer. She is here accompanied by her sister Catherine Tyler.  She is due to start treatment with Fulvestrant injections given every 2 weeks initially x 3 which is a loading dose, and then every 4 weeks thereafter.    She underwent [place]na PET scan which showed no evidence of recurrence other than blastic lesions within the ribs, spine and sacrum without associated radiotracer activity, suggestive of a metastsatic estrogen receptor negative metastases.  She is now here to discuss starting treatment with oral Capecitabine.  REVIEW OF SYSTEMS: [name] is having several issues.  First, her pain is not under control. She is taking MS IR every 4 hours, and notes that she will only make it through two hours and need to lie down from her pain.  Her pain is worse in her rib cage, and in her back.  The pain is affecting her ability to function.  She is experiencing heartburn that started last week, and it has been worsening, this is accompanied by nausea daily, worse first thing in the morning and her weight is down by 9 pounds in the past 4 weeks.  She has longstanding constipation and takes Miralax, Colace, Dulcolax, and is still having difficulty.  She was prescribed Amitiza, however without insurance, it was $300, she just received medicaid today, and therefore she plans on trying to refill it and see how much it costs with insurance.    Otherwise, [name] is doing well and a detailed ROS was otherwise non  contributory.   PAST MEDICAL HISTORY: Past Medical History:  Diagnosis Date  . Cancer ([place])   . Personal history of chemotherapy   . Personal history of radiation therapy     PAST SURGICAL HISTORY: Past Surgical History:  Procedure Laterality Date  . BREAST LUMPECTOMY    . BREAST SURGERY      FAMILY  HISTORY: Family History  Problem Relation Age of Onset  . Hyperlipidemia Mother   . Heart failure Mother   The patient's father died at age 25 from a myocardial infarction.  As far as the patient knows there is no history of cancer on that side of the family.  The patient's mother died at age 72 from renal failure.  The patient is not aware of any cancer on that side of the family.  The patient herself has 5 sisters, no brothers.  1 sister [name]) had lung cancer.   GYNECOLOGIC HISTORY:  No LMP recorded (lmp unknown). Patient has had a hysterectomy. Menarche: 63 years old Age at first live birth: 64 years old [place] P 1 LMP status post hysterectomy at age 59 secondary to fibroids; status post remote conization for in situ cervical carcinoma BSO?    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  She lives by herself with her dog [name] who is a rescue.  The patient has been divorced 13 years as of June 2021.  Her son Taziah Difatta lives in [place] where he works in Architect.  The patient has 1 grandchild.  She attends [employer].  ADVANCED DIRECTIVES: To be discussed   HEALTH MAINTENANCE: Social History   Tobacco Use  . Smoking status: Current Some Day Smoker  . Smokeless tobacco: Never Used  Vaping Use  . Vaping Use: Never used  Substance Use Topics  . Alcohol use: Yes    Comment: on occasion  . Drug use: Never     Colonoscopy: Never  PAP: 07/2018, negative  Bone density: Never   Allergies  Allergen Reactions  . Sulfa Antibiotics Nausea And Vomiting  . Tape Other (See Comments)    Takes off skin    Current Outpatient Medications  Medication Sig Dispense Refill  . acetaminophen (TYLENOL) 500 MG tablet Take 1 tablet (500 mg total) by mouth with breakfast, with lunch, and with evening meal. Take together with aleve 220 mg 30 tablet 0  . bisacodyl (DULCOLAX) 10 MG suppository Place 1 suppository (10 mg  total) rectally as needed for moderate constipation. 12 suppository 0  . capecitabine (XELODA) 500 MG tablet Take 3 tablets (1,500 mg total) by mouth 2 (two) times daily after a meal. Tak for 14 days, then do not take for 7 days, then repeat 84 tablet 6  . docusate sodium (COLACE) 100 MG capsule Take 2 capsules (200 mg total) by mouth 2 (two) times daily as needed for mild constipation. 80 capsule 0  . lisinopril (ZESTRIL) 10 MG tablet Take 1 tablet (10 mg total) by mouth daily. 30 tablet 11  . lubiprostone (AMITIZA) 24 MCG capsule Take 1 capsule (24 mcg total) by mouth 2 (two) times daily with a meal. 60 capsule 3  . naproxen sodium (ALEVE) 220 MG tablet Take 1 tablet (220 mg total) by mouth with breakfast, with lunch, and with evening meal. 90 tablet 4  . ondansetron (ZOFRAN ODT) 4 MG disintegrating tablet Take 1 tablet (4 mg total) by mouth every 8 (eight) hours  as needed for nausea or vomiting. 20 tablet 0  . polyethylene glycol (MIRALAX / GLYCOLAX) 17 g packet Take 17 g by mouth daily.    Marland Kitchen morphine (MS CONTIN) 15 MG 12 hr tablet Take 1 tablet (15 mg total) by mouth every 12 (twelve) hours. 60 tablet 0  . morphine (MSIR) 15 MG tablet Take 1 tablet (15 mg total) by mouth every 6 (six) hours as needed for severe pain. 30 tablet 0  . omeprazole (PRILOSEC) 40 MG capsule Take 1 capsule (40 mg total) by mouth daily. 90 capsule 3   No current facility-administered medications for this visit.    OBJECTIVE: White woman who appears uncomfortable  Vitals:   08/17/20 1125  BP: 122/87  Pulse: 81  Resp: 18  Temp: (!) 97.3 F (36.3 C)  SpO2: 98%   Wt Readings from Last 3 Encounters:  08/17/20 150 lb 1.6 oz (68.1 kg)  07/20/20 159 lb 4.8 oz (72.3 kg)  07/13/20 156 lb 1.6 oz (70.8 kg)   Body mass index is 24.98 kg/m.    ECOG FS:1 - Symptomatic but completely ambulatory GENERAL: Patient is a well appearing female in no acute distress HEENT:  Sclerae anicteric. Mask in place. Neck is supple.   NODES:  No cervical, supraclavicular, or axillary lymphadenopathy palpated.  BREAST EXAM:  Deferred. LUNGS:  Clear to auscultation bilaterally.  No wheezes or rhonchi. HEART:  Regular rate and rhythm. No murmur appreciated. ABDOMEN:  Soft, nontender.  Positive, normoactive bowel sounds. No organomegaly palpated. MSK:  No focal spinal tenderness to palpation. Full range of motion bilaterally in the upper extremities. EXTREMITIES:  No peripheral edema.   SKIN:  Clear with no obvious rashes or skin changes. No nail dyscrasia. NEURO:  Nonfocal. Well oriented.  Appropriate affect.    LAB RESULTS:  CMP     Component Value Date/Time   NA 136 08/17/2020 1103   NA 140 09/02/2018 1106   K 4.7 08/17/2020 1103   CL 105 08/17/2020 1103   CO2 26 08/17/2020 1103   GLUCOSE 116 (H) 08/17/2020 1103   BUN 11 08/17/2020 1103   BUN 22 09/02/2018 1106   CREATININE 0.72 08/17/2020 1103   CALCIUM 9.3 08/17/2020 1103   PROT 7.2 08/17/2020 1103   ALBUMIN 3.8 08/17/2020 1103   AST 19 08/17/2020 1103   ALT 13 08/17/2020 1103   ALKPHOS 223 (H) 08/17/2020 1103   BILITOT 0.4 08/17/2020 1103   GFRNONAA >60 08/17/2020 1103   GFRAA >60 08/17/2020 1103    No results found for: TOTALPROTELP, ALBUMINELP, A1GS, A2GS, BETS, BETA2SER, GAMS, MSPIKE, SPEI  Lab Results  Component Value Date   WBC 8.1 08/17/2020   NEUTROABS 5.3 08/17/2020   HGB 13.9 08/17/2020   HCT 42.6 08/17/2020   MCV 87.7 08/17/2020   PLT 245 08/17/2020    No results found for: LABCA2  No components found for: GYJEHU314  No results for input(s): INR in the last 168 hours.  No results found for: LABCA2  No results found for: HFW263  No results found for: CAN125  No results found for: ZCH885  Lab Results  Component Value Date   CA2729 484.8 (H) 08/17/2020    No components found for: HGQUANT  Lab Results  Component Value Date   CEA1 1,489.04 (H) 08/17/2020   /  CEA ([place])  Date Value Ref Range Status   08/17/2020 1,489.04 (H) 0.00 - 5.00 ng/mL Final    Comment:    (NOTE) This test was performed  using Architect's Chemiluminescent Microparticle Immunoassay. Values obtained from different assay methods cannot be used interchangeably. Please note that 5-10% of patients who smoke may see CEA levels up to 6.9 ng/mL. Performed at [facility], [place] [address] [place], [place] [address]      No results found for: AFPTUMOR  No results found for: CHROMOGRNA  No results found for: KPAFRELGTCHN, LAMBDASER, KAPLAMBRATIO (kappa/lambda light chains)  No results found for: HGBA, HGBA2QUANT, HGBFQUANT, HGBSQUAN (Hemoglobinopathy evaluation)   No results found for: LDH  No results found for: IRON, TIBC, IRONPCTSAT (Iron and TIBC)  No results found for: FERRITIN  Urinalysis    Component Value Date/Time   COLORURINE STRAW (A) 04/27/2020 0928   APPEARANCEUR CLEAR 04/27/2020 0928   LABSPEC 1.015 05/30/2020 1402   PHURINE 6.0 05/30/2020 1402   GLUCOSEU NEGATIVE 05/30/2020 1402   HGBUR NEGATIVE 05/30/2020 1402   BILIRUBINUR NEGATIVE 05/30/2020 1402   KETONESUR NEGATIVE 05/30/2020 1402   PROTEINUR NEGATIVE 05/30/2020 1402   UROBILINOGEN 0.2 05/30/2020 1402   NITRITE NEGATIVE 05/30/2020 1402   LEUKOCYTESUR NEGATIVE 05/30/2020 1402     STUDIES: NM PET (CERIANNA) WHOLE BODY  Result Date: 07/31/2020 CLINICAL DATA:  64 year old female with RIGHT breast biopsy 2006 six revealing invasive ductal carcinoma. Estrogen receptor positive. Subsequent RIGHT lumpectomy on 07/08/2005 with positive RIGHT axillary lymph nodes. Patient status post adjuvant radiotherapy. Patient status post neoadjuvant chemotherapy. Recent CT revealed blastic lesions in the thoracic spine and LEFT ribs. Patient complaining of bone pain. EXAM: NUCLEAR MEDICINE PET WHOLE BODY TECHNIQUE: 5.7 mCi F-18 Estradiol was injected intravenously. Full-ring PET imaging was performed from the vertex to feet after  the radiotracer. CT data was obtained and used for attenuation correction and anatomic localization. COMPARISON:  CT 06/15/2020 FINDINGS: Mediastinal blood pool activity: 1.7 HEAD/NECK: No abnormal brain activity. Abnormal activity in cervical lymph nodes. Incidental CT findings: None CHEST: No radiotracer accumulation in small RIGHT upper lobe pulmonary nodule measuring 4 mm on image 70/4. No radiotracer accumulation or enlargement of axillary lymph nodes. No mediastinal adenopathy. Incidental CT findings: Coronary artery calcification and aortic atherosclerotic calcification. ABDOMEN/PELVIS: No abnormal activity in abdominopelvic lymph nodes. Intense physiologic activity noted in the liver. Incidental CT findings: Non-obstructing LEFT renal calculi. No bowel obstruction. SKELETON: Expansile sclerotic lesions within multiple LEFT posterolateral ribs with periosteal reaction. No radiotracer accumulation associated with the skeletal metastasis. Additional sclerotic metastasis noted in the upper thoracic spine. Less well-defined sclerotic lesion in the RIGHT sacral ala. Importantly, no radiotracer associated with these sclerotic skeletal metastasis. Incidental CT findings: None EXTREMITIES: No abnormal radiotracer accumulation. No distal metastatic disease. Incidental CT findings: None IMPRESSION: 1. No evidence of estrogen positive receptor breast cancer recurrence on whole-body F 18 estradiol PET scan. 2. Multiple blastic lesions within the ribs, spine, and sacrum without associated radiotracer activity. Findings most suggestive of estrogen receptor negative breast cancer metastasis. 3. Small upper lobe pulmonary nodule on RIGHT without radiotracer activity. Electronically Signed   By: Suzy Bouchard M.D.   On: 07/31/2020 16:33     ELIGIBLE FOR AVAILABLE RESEARCH PROTOCOL: no  ASSESSMENT: 64 y.o. [place] woman   (1) status post right lumpectomy and axillary lymph node dissection in 2007 for a stage III  invasive breast cancer, additional pathology details to be reviewed when available  (a) a total of 13 axillary lymph nodes were removed, 11 positive  (b) adjuvant chemotherapy consisted of docetaxel, doxorubicin and cyclophosphamide x6  (c) the patient received adjuvant radiation  (d) received letrozole briefly  but did not tolerate it.  METASTATIC DISEASE (2) CT scans of the chest abdomen and pelvis 06/15/2020 shows widespread bony metastatic disease, no definitive visceral disease  (a) bone marrow biopsy  06/20/2020 nondiagnostic  (b) F 18 estradiol PET scan (cerianna) 07/31/2020 shows no estrogen uptake in the known bone lesions or elsewhere  (3) Capecitabine to start on 08/24/2020 at 1500 mg twice daily, 14 days on, 7 days off    PLAN: Catherine Tyler is here for follow up and discussion of her metastatic breast cancer and treatment.  She met with myself and Dr. Jana Hakim to discuss this in detail.  Dr. Jana Hakim explained that the [place] scan showed that the cancer spots on her bones are not eating estrogens.  Therefore, anti estrogen therapy such as the Fulvestrant that we were planning, will not be beneficial.    Instead, she will need to take an oral chemotherapy.  Chemotherapy kills cells, more cancer cells, however than non cancer cells.  She will start Capecitabine given 3 tablets BID 14 days on and 7 days off.  We reviewed that the main adverse effects include severe diarrhea, mucositis, and skin peeling on the palms of the hands and soles of the feet.  She was recommended to call us if she has any of these so that we can help her with managing them.  She understands this.  For her pain, we will add MS Contin 15 MG BID Disp #60.  We explained how long acting pain medication works, and how it will help keep her pain level, and she can continue to take the breakthrough as needed.  She understands her goal of pain treatment is to give her an improved ability to function.    She will start  Omeprazole 27m daily for her heartburn.  She can also take Tums if needed.  This will likely also help with her nausea and hopefully she will be able to note improvement with her appetite.    We will see AChesneeback on 09/20/2020 for labs, and follow up.  She knows to call for any questions that may arise between now and her next appointment.  We are happy to see her sooner if needed.   Total encounter time 45 minutes.*Wilber Bihari NP 08/19/20 12:17 PM Medical On[facility] [place] [id number]. [phone]   Fax. [phone]  ADDENDUM: I reviewed the scan results with Catherine Tyler and her sister.  The's Aryanna scan spares her a bone biopsy but unfortunately it is negative.  That means that we will not get any benefit from antiestrogens such as fulvestrant and that is being discontinued.  Instead we will have to move to the chemotherapy side of metastatic breast cancer treatment.  The usual first-line is capecitabine.  We discussed the possible toxicities side effects and complications in detail today and the patient is ready to start however she does have a beach trip coming up and she would like to start at the end of that.  Target start date is 08/24/2020.  We will see her again at each day 1 or day -1 of each cycle.  After 4 or 5 cycles we will restage.  We are also following her CA 27-29.  Catherine Tyler has a good understanding of this and is very eager to proceed.  I personally saw this patient and performed a substantive portion of this encounter with the listed APP documented above.   GVirgie DadMagrinat,  MD Medical Oncology and Hematology [facility] [address] [place], [place] [address] Tel. [phone]    Fax. [phone]     *Total Encounter Time as defined by the Centers for Medicare and Medicaid Services includes, in addition to the face-to-face time of a patient visit (documented in the note  above) non-face-to-face time: obtaining and reviewing outside history, ordering and reviewing medications, tests or procedures, care coordination (communications with other health care professionals or caregivers) and documentation in the medical record.

## 2020-08-17 NOTE — Telephone Encounter (Signed)
Scheduled appts per 8/19 los. Gave pt a print out of AVS.  

## 2020-08-18 ENCOUNTER — Other Ambulatory Visit: Payer: Self-pay | Admitting: *Deleted

## 2020-08-18 ENCOUNTER — Other Ambulatory Visit: Payer: Self-pay | Admitting: Oncology

## 2020-08-18 ENCOUNTER — Other Ambulatory Visit: Payer: Self-pay | Admitting: Adult Health

## 2020-08-18 LAB — CANCER ANTIGEN 27.29: CA 27.29: 484.8 U/mL — ABNORMAL HIGH (ref 0.0–38.6)

## 2020-08-18 MED ORDER — MORPHINE SULFATE 15 MG PO TABS: 15.0000 mg | ORAL_TABLET | Freq: Four times a day (QID) | ORAL | 0 refills | Status: DC | PRN

## 2020-08-18 MED ORDER — MORPHINE SULFATE ER 15 MG PO TBCR: 15.0000 mg | EXTENDED_RELEASE_TABLET | Freq: Two times a day (BID) | ORAL | 0 refills | Status: DC

## 2020-08-21 ENCOUNTER — Telehealth: Payer: Self-pay | Admitting: *Deleted

## 2020-08-21 NOTE — Telephone Encounter (Signed)
This RN returned call left by the pt at approximately 330 pm today stating " I may have a blood clot in my leg and just wonder if Dr Jana Hakim has someone he want to refer me to since I have cancer"  " I am currently in [facility] "  Return call number given as [phone].  This RN returned call - Edmonia states after leaving the message above she called and had a virtual visit by an urgent care in [facility] and was advised she should proceed to the ER.  Per description - Flo states she has hx of varicosity in her legs - primarily the right leg - and noted increased pain after walking on the beach yesterday.  Then today " I have 2 large knots on my right leg right where the varicose veins are "  This RN informed pt - this office recommends the same as the Virtual MD- and she should proceed to the nearest ER due to potential life threatening situation.  Kristalynn verbalized understanding and stated plan to proceed as recommended.

## 2020-08-23 ENCOUNTER — Telehealth: Payer: Self-pay | Admitting: *Deleted

## 2020-08-24 ENCOUNTER — Other Ambulatory Visit: Payer: Self-pay | Admitting: Oncology

## 2020-08-24 MED ORDER — MORPHINE SULFATE 15 MG PO TABS
15.0000 mg | ORAL_TABLET | Freq: Four times a day (QID) | ORAL | 0 refills | Status: DC | PRN
Start: 2020-08-24 — End: 2020-08-25

## 2020-08-24 NOTE — Telephone Encounter (Signed)
[name] left a message stating she will need a refill of MSIR 15 mg. Last filled 08/18/20 in [place]. They dispensed 30 tablets. She will run out on Saturday.   Is requesting a refill of 60 tablets. Dr Jana Hakim notified

## 2020-08-25 ENCOUNTER — Encounter: Payer: Self-pay | Admitting: *Deleted

## 2020-08-25 ENCOUNTER — Other Ambulatory Visit: Payer: Self-pay | Admitting: Adult Health

## 2020-08-25 ENCOUNTER — Telehealth: Payer: Self-pay | Admitting: *Deleted

## 2020-08-25 DIAGNOSIS — Z170 Estrogen receptor positive status [ER+]: Secondary | ICD-10-CM

## 2020-08-25 DIAGNOSIS — G893 Neoplasm related pain (acute) (chronic): Secondary | ICD-10-CM

## 2020-08-25 DIAGNOSIS — C7951 Secondary malignant neoplasm of bone: Secondary | ICD-10-CM

## 2020-08-25 MED ORDER — MORPHINE SULFATE 15 MG PO TABS: 15.0000 mg | ORAL_TABLET | ORAL | 0 refills | Status: DC | PRN

## 2020-08-25 NOTE — Telephone Encounter (Signed)
Pt called requesting refill on her MSIR 15mg  - pt states she only bought a partial fill last week due to " insurance issues " and she was at the beach. She states she only has enough pills to last thru AM tomorrow.  Noted prescription sent yesterday to her local pharmacy for 60 tablets.  This RN contacted [facility] and per pharmacist- " it was rejected by Medicaid due to greater the 5 day supply and need for medical override "  She states PA request has been sent.  Per need - pharmacy will fill 5 day supply. Once PA obtained- full month supply can be filled with new prescription.  This RN called pt to inform- obtained identified VM- detailed message left per above.  This note will be forwarded to PA nurse for medications for verification request was received from pt's pharmacy.

## 2020-08-30 ENCOUNTER — Other Ambulatory Visit: Payer: Self-pay | Admitting: Oncology

## 2020-08-30 DIAGNOSIS — Z170 Estrogen receptor positive status [ER+]: Secondary | ICD-10-CM

## 2020-08-30 DIAGNOSIS — C7951 Secondary malignant neoplasm of bone: Secondary | ICD-10-CM

## 2020-08-30 MED ORDER — MORPHINE SULFATE 15 MG PO TABS: 15.0000 mg | ORAL_TABLET | ORAL | 0 refills | Status: DC | PRN

## 2020-08-30 NOTE — Telephone Encounter (Signed)
This RN spoke with pt per her call stating she will need refill for the short acting pain medication- MSIR.  Note pt was only able to obtain a 5 day supply on 8/27 due to need to obtain medicaid override.  Override has been obtained ( verified ) and pt may now pick up a 30 day supply which equates to 180 tablets )  Aerilyn states she is not using the MS Contin due to " it really made me feel like I could breath well- like take deep breaths- and then twice I felt like I was going to pass out "  She states she was on the MS Contin for over a week with continued symptoms " so I stopped it 2 days ago "  She states she feels her pain is controllable on the MSIR and she is able to function well.  She declines need for any long acting pain medication at this time.  Refill for MSIR for 30 day supply given to MD.  This note will be forwarded to MD for review of communication.

## 2020-09-07 ENCOUNTER — Other Ambulatory Visit: Payer: Medicaid Other

## 2020-09-07 ENCOUNTER — Ambulatory Visit: Payer: Medicaid Other

## 2020-09-11 MED FILL — CAPECITABINE 500 MG TABLET: 500 | 21 days supply | Qty: 84 | Fill #1

## 2020-09-14 ENCOUNTER — Ambulatory Visit: Payer: Medicaid Other

## 2020-09-19 NOTE — Progress Notes (Signed)
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  From her report, this appears to have been about 3 cm and to have involved 11 of the 13 right axillary lymph nodes involved.  She was treated at [facility] by Dr.  She was treated under Dr Epimenio Foot in [facility] with lumpectomy, chemotherapy (TAC x6) , and radiation therapy.  She then tried Femara which she did not tolerate and no further antiestrogens were prescribed.  She presented to the ED yesterday, 06/15/2020, with diffuse back, chest, and abdominal pain, as well as unintentional weight loss. Chest x-ray performed at that time showed: concern for bone metastasis with pathologic fracture of left 5th rib.  She proceeded to chest, abdomen, pelvis CT that day, which showed: multifocal predominantly blastic-appearing osseous metastatic disease involving the thoracic and lumbar spine, multiple ribs most prominently at the left fifth through seventh ribs, and the right sacral ala; nonspecific mixed solid nodules and ill-defined ground-glass opacities in the left upper lobe measuring up to 7 mm and 6 mm in size; mild distal small bowel fecalization without evidence of obstruction.  Of note, her most recent mammogram on file is a bilateral diagnostic scan from 08/11/2018. At that time, she presented with a burning sensation to the inferior right breast. Mammogram showed: breast density category B; no mammographic  evidence of malignancy in the bilateral breasts.   The patient's subsequent history is as detailed below.   INTERVAL HISTORY: Aiysha returns today for follow-up and treatment of her stage IV cancer. She is here accompanied by her sister [name].    She underwent Cerianna PET scan which showed no evidence of recurrence other than blastic lesions within the ribs, spine and sacrum without associated radiotracer activity, suggestive of a metastsatic estrogen receptor negative metastases.  She started Capecitabine BID 2 weeks on and 1 week off.  She is on her second cycle and is tolerating it well.  She has no diarrhea, rash, or skin peeling in her hands or feet.  She does receive xgeva and started this back on 07/16/2020.  REVIEW OF SYSTEMS: Ayushi is doing moderately well.  She is constipated.  She has tried Ex lax two days in a row, stool softener, linzess, and suppositories, she finally did an enema which helped.  She was unable to get amitiza or linzess due to her insurance.  Marlicia has stopped taking the MS contin.  She notes she felt like she couldn't breathe with the medication, and decided not to take it.  She is taking MSIR every 4 hours as needed, and her pain is controlled.  She has not wanted to eat because of her constipation.  She denies any new issues and a detailed ROS was otherwise non contributory.    PAST MEDICAL HISTORY: Past Medical History:  Diagnosis Date  . Cancer ([place])   . Personal history of chemotherapy   . Personal history of radiation therapy     PAST SURGICAL HISTORY: Past Surgical History:  Procedure Laterality  Date  . BREAST LUMPECTOMY    . BREAST SURGERY      FAMILY HISTORY: Family History  Problem Relation Age of Onset  . Hyperlipidemia Mother   . Heart failure Mother   The patient's father died at age 95 from a myocardial infarction.  As far as the patient knows there is no history of cancer on that side of the family.  The patient's mother died at  age 63 from renal failure.  The patient is not aware of any cancer on that side of the family.  The patient herself has 5 sisters, no brothers.  1 sister [name]) had lung cancer.   GYNECOLOGIC HISTORY:  No LMP recorded (lmp unknown). Patient has had a hysterectomy. Menarche: 64 years old Age at first live birth: 64 years old [place] P 1 LMP status post hysterectomy at age 77 secondary to fibroids; status post remote conization for in situ cervical carcinoma BSO?    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  She lives by herself with her dog [name] who is a rescue.  The patient has been divorced 13 years as of June 2021.  Her son Adelaine Roppolo lives in [place] where he works in Architect.  The patient has 1 grandchild.  She attends [employer].  ADVANCED DIRECTIVES: To be discussed   HEALTH MAINTENANCE: Social History   Tobacco Use  . Smoking status: Current Some Day Smoker  . Smokeless tobacco: Never Used  Vaping Use  . Vaping Use: Never used  Substance Use Topics  . Alcohol use: Yes    Comment: on occasion  . Drug use: Never     Colonoscopy: Never  PAP: 07/2018, negative  Bone density: Never   Allergies  Allergen Reactions  . Sulfa Antibiotics Nausea And Vomiting  . Tape Other (See Comments)    Takes off skin    Current Outpatient Medications  Medication Sig Dispense Refill  . acetaminophen (TYLENOL) 500 MG tablet Take 1 tablet (500 mg total) by mouth with breakfast, with lunch, and with evening meal. Take together with aleve 220 mg 30 tablet 0  . bisacodyl (DULCOLAX) 10 MG suppository Place 1 suppository (10 mg total) rectally as needed for moderate constipation. 12 suppository 0  . capecitabine (XELODA) 500 MG tablet Take 3 tablets (1,500 mg total) by mouth 2 (two) times daily after a meal. Tak for 14 days, then do not take for 7 days, then repeat 84 tablet 6  . lisinopril (ZESTRIL) 10 MG tablet  Take 1 tablet (10 mg total) by mouth daily. 30 tablet 11  . morphine (MSIR) 15 MG tablet Take 1 tablet (15 mg total) by mouth every 4 (four) hours as needed for severe pain. 180 tablet 0  . omeprazole (PRILOSEC) 40 MG capsule Take 1 capsule (40 mg total) by mouth daily. 90 capsule 3  . ondansetron (ZOFRAN ODT) 4 MG disintegrating tablet Take 1 tablet (4 mg total) by mouth every 8 (eight) hours as needed for nausea or vomiting. 20 tablet 0  . polyethylene glycol (MIRALAX / GLYCOLAX) 17 g packet Take 17 g by mouth daily.    . magnesium citrate SOLN Take 296 mLs (1 Bottle total) by mouth once for 1 dose. 195 mL 0  . senna-docusate (SENOKOT-S) 8.6-50 MG tablet Take 2 tablets by mouth 2 (two) times daily. 120 tablet 0  . sodium phosphate (FLEET) 7-19 GM/118ML ENEM Place 133 mLs (1  enema total) rectally daily as needed for severe constipation. 399 mL 0   No current facility-administered medications for this visit.    OBJECTIVE: White woman who appears uncomfortable  Vitals:   09/20/20 1502  BP: (!) 155/81  Pulse: 70  Resp: 18  Temp: (!) 97.4 F (36.3 C)  SpO2: 100%   Wt Readings from Last 3 Encounters:  09/20/20 147 lb 3.2 oz (66.8 kg)  08/17/20 150 lb 1.6 oz (68.1 kg)  07/20/20 159 lb 4.8 oz (72.3 kg)   Body mass index is 24.5 kg/m.    ECOG FS:1 - Symptomatic but completely ambulatory GENERAL: Patient is a well appearing female in no acute distress HEENT:  Sclerae anicteric. Mask in place. Neck is supple.  NODES:  No cervical, supraclavicular, or axillary lymphadenopathy palpated.  BREAST EXAM:  Deferred. LUNGS:  Clear to auscultation bilaterally.  No wheezes or rhonchi. HEART:  Regular rate and rhythm. No murmur appreciated. ABDOMEN:  Soft, nontender.  Positive, normoactive bowel sounds. No organomegaly palpated. MSK:  No focal spinal tenderness to palpation. Full range of motion bilaterally in the upper extremities. EXTREMITIES:  No peripheral edema.   SKIN:  Clear with no  obvious rashes or skin changes. No nail dyscrasia. NEURO:  Nonfocal. Well oriented.  Appropriate affect.    LAB RESULTS:  CMP     Component Value Date/Time   NA 138 09/20/2020 1444   NA 140 09/02/2018 1106   K 4.2 09/20/2020 1444   CL 104 09/20/2020 1444   CO2 30 09/20/2020 1444   GLUCOSE 107 (H) 09/20/2020 1444   BUN 12 09/20/2020 1444   BUN 22 09/02/2018 1106   CREATININE 0.71 09/20/2020 1444   CALCIUM 9.4 09/20/2020 1444   PROT 6.6 09/20/2020 1444   ALBUMIN 3.4 (L) 09/20/2020 1444   AST 21 09/20/2020 1444   ALT 16 09/20/2020 1444   ALKPHOS 223 (H) 09/20/2020 1444   BILITOT 0.4 09/20/2020 1444   GFRNONAA >60 09/20/2020 1444   GFRAA >60 09/20/2020 1444    No results found for: TOTALPROTELP, ALBUMINELP, A1GS, A2GS, BETS, BETA2SER, GAMS, MSPIKE, SPEI  Lab Results  Component Value Date   WBC 6.9 09/20/2020   NEUTROABS 3.6 09/20/2020   HGB 13.4 09/20/2020   HCT 40.4 09/20/2020   MCV 89.8 09/20/2020   PLT 205 09/20/2020    No results found for: LABCA2  No components found for: [id number]  No results for input(s): INR in the last 168 hours.  No results found for: LABCA2  No results found for: CAN199  No results found for: HKG677  No results found for: CHE035  Lab Results  Component Value Date   CA2729 484.8 (H) 08/17/2020    No components found for: HGQUANT  Lab Results  Component Value Date   CEA1 1,489.04 (H) 08/17/2020   /  CEA ([place])  Date Value Ref Range Status  08/17/2020 1,489.04 (H) 0.00 - 5.00 ng/mL Final    Comment:    (NOTE) This test was performed using Architect's Chemiluminescent Microparticle Immunoassay. Values obtained from different assay methods cannot be used interchangeably. Please note that 5-10% of patients who smoke may see CEA levels up to 6.9 ng/mL. Performed at [facility], [place] [address] [place], [place] [address]      No results found for: AFPTUMOR  No results found for:  CHROMOGRNA  No results found for: KPAFRELGTCHN, LAMBDASER, KAPLAMBRATIO (kappa/lambda light chains)  No results found for: HGBA, HGBA2QUANT, HGBFQUANT, HGBSQUAN (Hemoglobinopathy evaluation)  No results found for: LDH  No results found for: IRON, TIBC, IRONPCTSAT (Iron and TIBC)  No results found for: FERRITIN  Urinalysis    Component Value Date/Time   COLORURINE STRAW (A) 04/27/2020 0928   APPEARANCEUR CLEAR 04/27/2020 0928   LABSPEC 1.015 05/30/2020 1402   PHURINE 6.0 05/30/2020 1402   GLUCOSEU NEGATIVE 05/30/2020 1402   HGBUR NEGATIVE 05/30/2020 1402   BILIRUBINUR NEGATIVE 05/30/2020 1402   KETONESUR NEGATIVE 05/30/2020 1402   PROTEINUR NEGATIVE 05/30/2020 1402   UROBILINOGEN 0.2 05/30/2020 1402   NITRITE NEGATIVE 05/30/2020 1402   LEUKOCYTESUR NEGATIVE 05/30/2020 1402     STUDIES: No results found.   ELIGIBLE FOR AVAILABLE RESEARCH PROTOCOL: no  ASSESSMENT: 64 y.o. [place] woman   (1) status post right lumpectomy and axillary lymph node dissection in 2007 for a stage III invasive breast cancer, additional pathology details to be reviewed when available  (a) a total of 13 axillary lymph nodes were removed, 11 positive  (b) adjuvant chemotherapy consisted of docetaxel, doxorubicin and cyclophosphamide x6  (c) the patient received adjuvant radiation  (d) received letrozole briefly but did not tolerate it.  METASTATIC DISEASE (2) CT scans of the chest abdomen and pelvis 06/15/2020 shows widespread bony metastatic disease, no definitive visceral disease  (a) bone marrow biopsy  06/20/2020 nondiagnostic  (b) F 18 estradiol PET scan (cerianna) 07/31/2020 shows no estrogen uptake in the known bone lesions or elsewhere  (3) Capecitabine to start on 08/24/2020 at 1500 mg twice daily, 14 days on, 7 days off  (a) Xgeva started on 07/13/2020 given every 4 weeks    PLAN: Janace Hoard is doing well on the Capecitabine BID.  She has no considerable side effects.  She will  continue this 2 weeks on and one week off, and her labs are stable.  She has no clinical signs of progression.    She has stopped taking the long acting morphine due to respiratory depression.  I refilled her morphine IR.  I also sent in a bowel regimen for her and recommended she take senokot s 2 tab BID, Miralax daily, and mag citrate if needed.    We will see her back in 4 weeks for labs and f/u.  She knows to call for any questions that may arise between now and her next appointment.  We are happy to see her sooner if needed.   Total encounter time 20 minutes.Wilber Bihari, NP 09/20/20 3:56 PM Medical Oncology and Hematology [facility] [place], [place] [address] Tel. [phone]    Fax. [phone]     *Total Encounter Time as defined by the Centers for Medicare and Medicaid Services includes, in addition to the face-to-face time of a patient visit (documented in the note above) non-face-to-face time: obtaining and reviewing outside history, ordering and reviewing medications, tests or procedures, care coordination (communications with other health care professionals or caregivers) and documentation in the medical record.

## 2020-09-20 ENCOUNTER — Inpatient Hospital Stay: Payer: Medicaid Other | Attending: Oncology | Admitting: Adult Health

## 2020-09-20 ENCOUNTER — Inpatient Hospital Stay: Payer: Medicaid Other

## 2020-09-20 ENCOUNTER — Other Ambulatory Visit: Payer: Self-pay

## 2020-09-20 ENCOUNTER — Encounter: Payer: Self-pay | Admitting: Adult Health

## 2020-09-20 VITALS — BP 155/81 | HR 70 | Temp 97.4°F | Resp 18 | Ht 65.0 in | Wt 147.2 lb

## 2020-09-20 DIAGNOSIS — Z923 Personal history of irradiation: Secondary | ICD-10-CM | POA: Insufficient documentation

## 2020-09-20 DIAGNOSIS — C50911 Malignant neoplasm of unspecified site of right female breast: Secondary | ICD-10-CM | POA: Diagnosis present

## 2020-09-20 DIAGNOSIS — Z9221 Personal history of antineoplastic chemotherapy: Secondary | ICD-10-CM | POA: Insufficient documentation

## 2020-09-20 DIAGNOSIS — F17200 Nicotine dependence, unspecified, uncomplicated: Secondary | ICD-10-CM | POA: Insufficient documentation

## 2020-09-20 DIAGNOSIS — C50811 Malignant neoplasm of overlapping sites of right female breast: Secondary | ICD-10-CM | POA: Diagnosis not present

## 2020-09-20 DIAGNOSIS — Z170 Estrogen receptor positive status [ER+]: Secondary | ICD-10-CM | POA: Diagnosis not present

## 2020-09-20 DIAGNOSIS — C7951 Secondary malignant neoplasm of bone: Secondary | ICD-10-CM | POA: Insufficient documentation

## 2020-09-20 DIAGNOSIS — G893 Neoplasm related pain (acute) (chronic): Secondary | ICD-10-CM | POA: Diagnosis not present

## 2020-09-20 LAB — CBC WITH DIFFERENTIAL/PLATELET
Abs Immature Granulocytes: 0.01 10*3/uL (ref 0.00–0.07)
Basophils Absolute: 0.1 10*3/uL (ref 0.0–0.1)
Basophils Relative: 1 %
Eosinophils Absolute: 0.2 10*3/uL (ref 0.0–0.5)
Eosinophils Relative: 3 %
HCT: 40.4 % (ref 36.0–46.0)
Hemoglobin: 13.4 g/dL (ref 12.0–15.0)
Immature Granulocytes: 0 %
Lymphocytes Relative: 38 %
Lymphs Abs: 2.6 10*3/uL (ref 0.7–4.0)
MCH: 29.8 pg (ref 26.0–34.0)
MCHC: 33.2 g/dL (ref 30.0–36.0)
MCV: 89.8 fL (ref 80.0–100.0)
Monocytes Absolute: 0.5 10*3/uL (ref 0.1–1.0)
Monocytes Relative: 7 %
Neutro Abs: 3.6 10*3/uL (ref 1.7–7.7)
Neutrophils Relative %: 51 %
Platelets: 205 10*3/uL (ref 150–400)
RBC: 4.5 MIL/uL (ref 3.87–5.11)
RDW: 14.7 % (ref 11.5–15.5)
WBC: 6.9 10*3/uL (ref 4.0–10.5)
nRBC: 0 % (ref 0.0–0.2)

## 2020-09-20 LAB — COMPREHENSIVE METABOLIC PANEL
ALT: 16 U/L (ref 0–44)
AST: 21 U/L (ref 15–41)
Albumin: 3.4 g/dL — ABNORMAL LOW (ref 3.5–5.0)
Alkaline Phosphatase: 223 U/L — ABNORMAL HIGH (ref 38–126)
Anion gap: 4 — ABNORMAL LOW (ref 5–15)
BUN: 12 mg/dL (ref 8–23)
CO2: 30 mmol/L (ref 22–32)
Calcium: 9.4 mg/dL (ref 8.9–10.3)
Chloride: 104 mmol/L (ref 98–111)
Creatinine, Ser: 0.71 mg/dL (ref 0.44–1.00)
GFR calc Af Amer: >60 mL/min (ref >60)
GFR calc non Af Amer: >60 mL/min (ref >60)
Glucose, Bld: 107 mg/dL — ABNORMAL HIGH (ref 70–99)
Potassium: 4.2 mmol/L (ref 3.5–5.1)
Sodium: 138 mmol/L (ref 135–145)
Total Bilirubin: 0.4 mg/dL (ref 0.3–1.2)
Total Protein: 6.6 g/dL (ref 6.5–8.1)

## 2020-09-20 MED ORDER — BISACODYL 10 MG RE SUPP: 10.0000 mg | RECTAL | 0 refills | Status: AC | PRN

## 2020-09-20 MED ORDER — SENNOSIDES-DOCUSATE SODIUM 8.6-50 MG PO TABS: 2.0000 | ORAL_TABLET | Freq: Two times a day (BID) | ORAL | 0 refills | Status: AC

## 2020-09-20 MED ORDER — MAGNESIUM CITRATE PO SOLN: 1.0000 | Freq: Once | ORAL | 0 refills | Status: AC

## 2020-09-20 MED ORDER — DENOSUMAB 120 MG/1.7ML ~~LOC~~ SOLN
120.0000 mg | Freq: Once | SUBCUTANEOUS | Status: AC
  Administered 2020-09-20: 120 mg via SUBCUTANEOUS

## 2020-09-20 MED ORDER — FLEET ENEMA 7-19 GM/118ML RE ENEM: 1.0000 | ENEMA | Freq: Every day | RECTAL | 0 refills | Status: AC | PRN

## 2020-09-20 MED ORDER — DENOSUMAB 120 MG/1.7ML ~~LOC~~ SOLN
SUBCUTANEOUS | Status: AC
  Filled 2020-09-20: qty 1.7

## 2020-09-20 MED ORDER — MORPHINE SULFATE 15 MG PO TABS: 15.0000 mg | ORAL_TABLET | ORAL | 0 refills | Status: DC | PRN

## 2020-09-20 NOTE — Addendum Note (Signed)
Addended by: Lennie Odor on: 09/20/2020 04:23 PM   Modules accepted: Orders

## 2020-09-20 NOTE — Patient Instructions (Signed)
Senokot-S two tablets twice a day Miralax or ex lax daily If no BM in 2-3 days Magnesium citrate 1/2 bottle, followed by 8 ounces of water, if no bm in 6 hours repeat.

## 2020-09-21 ENCOUNTER — Telehealth: Payer: Self-pay | Admitting: Adult Health

## 2020-09-21 LAB — CEA (IN HOUSE-CHCC): CEA (CHCC-In House): 1122.94 ng/mL — ABNORMAL HIGH (ref 0.00–5.00)

## 2020-09-21 LAB — CANCER ANTIGEN 27.29: CA 27.29: 435.2 U/mL — ABNORMAL HIGH (ref 0.0–38.6)

## 2020-09-21 NOTE — Telephone Encounter (Signed)
Added injection appts to appts that were already scheduled from previous LOS. Made no changes to pt's arrival time.

## 2020-10-02 MED FILL — CAPECITABINE 500 MG TABLET: 500 | 21 days supply | Qty: 84 | Fill #2

## 2020-10-10 NOTE — Progress Notes (Signed)
The following Assist/Replace Program for Xgeva from [place] has been terminated due to [facility] Coverage.  Last DOS:09/20/2020

## 2020-10-11 ENCOUNTER — Inpatient Hospital Stay: Payer: Medicaid Other

## 2020-10-11 ENCOUNTER — Other Ambulatory Visit: Payer: Self-pay

## 2020-10-11 ENCOUNTER — Encounter: Payer: Self-pay | Admitting: Adult Health

## 2020-10-11 ENCOUNTER — Inpatient Hospital Stay: Payer: Medicaid Other | Attending: Oncology | Admitting: Adult Health

## 2020-10-11 VITALS — BP 135/82 | HR 65 | Temp 97.4°F | Resp 18 | Ht 65.0 in | Wt 146.5 lb

## 2020-10-11 DIAGNOSIS — C50911 Malignant neoplasm of unspecified site of right female breast: Secondary | ICD-10-CM | POA: Insufficient documentation

## 2020-10-11 DIAGNOSIS — Z170 Estrogen receptor positive status [ER+]: Secondary | ICD-10-CM | POA: Diagnosis not present

## 2020-10-11 DIAGNOSIS — C7951 Secondary malignant neoplasm of bone: Secondary | ICD-10-CM | POA: Insufficient documentation

## 2020-10-11 DIAGNOSIS — C50811 Malignant neoplasm of overlapping sites of right female breast: Secondary | ICD-10-CM

## 2020-10-11 LAB — COMPREHENSIVE METABOLIC PANEL
ALT: 26 U/L (ref 0–44)
AST: 32 U/L (ref 15–41)
Albumin: 3.6 g/dL (ref 3.5–5.0)
Alkaline Phosphatase: 182 U/L — ABNORMAL HIGH (ref 38–126)
Anion gap: 3 — ABNORMAL LOW (ref 5–15)
BUN: 14 mg/dL (ref 8–23)
CO2: 30 mmol/L (ref 22–32)
Calcium: 9.6 mg/dL (ref 8.9–10.3)
Chloride: 103 mmol/L (ref 98–111)
Creatinine, Ser: 0.77 mg/dL (ref 0.44–1.00)
GFR, Estimated: >60 mL/min (ref >60)
Glucose, Bld: 115 mg/dL — ABNORMAL HIGH (ref 70–99)
Potassium: 4.3 mmol/L (ref 3.5–5.1)
Sodium: 136 mmol/L (ref 135–145)
Total Bilirubin: 0.4 mg/dL (ref 0.3–1.2)
Total Protein: 6.8 g/dL (ref 6.5–8.1)

## 2020-10-11 LAB — CBC WITH DIFFERENTIAL/PLATELET
Abs Immature Granulocytes: 0.02 10*3/uL (ref 0.00–0.07)
Basophils Absolute: 0.1 10*3/uL (ref 0.0–0.1)
Basophils Relative: 1 %
Eosinophils Absolute: 0.1 10*3/uL (ref 0.0–0.5)
Eosinophils Relative: 2 %
HCT: 42.5 % (ref 36.0–46.0)
Hemoglobin: 14.1 g/dL (ref 12.0–15.0)
Immature Granulocytes: 0 %
Lymphocytes Relative: 21 %
Lymphs Abs: 1.6 10*3/uL (ref 0.7–4.0)
MCH: 29.7 pg (ref 26.0–34.0)
MCHC: 33.2 g/dL (ref 30.0–36.0)
MCV: 89.5 fL (ref 80.0–100.0)
Monocytes Absolute: 0.3 10*3/uL (ref 0.1–1.0)
Monocytes Relative: 4 %
Neutro Abs: 5.8 10*3/uL (ref 1.7–7.7)
Neutrophils Relative %: 72 %
Platelets: 178 10*3/uL (ref 150–400)
RBC: 4.75 MIL/uL (ref 3.87–5.11)
RDW: 15.6 % — ABNORMAL HIGH (ref 11.5–15.5)
WBC: 8 10*3/uL (ref 4.0–10.5)
nRBC: 0 % (ref 0.0–0.2)

## 2020-10-11 MED ORDER — DENOSUMAB 120 MG/1.7ML ~~LOC~~ SOLN
120.0000 mg | Freq: Once | SUBCUTANEOUS | Status: AC
  Administered 2020-10-11: 120 mg via SUBCUTANEOUS

## 2020-10-11 MED ORDER — DENOSUMAB 120 MG/1.7ML ~~LOC~~ SOLN
SUBCUTANEOUS | Status: AC
  Filled 2020-10-11: qty 1.7

## 2020-10-11 NOTE — Patient Instructions (Signed)
Denosumab injection What is this medicine? DENOSUMAB (den oh sue mab) slows bone breakdown. Prolia is used to treat osteoporosis in women after menopause and in men, and in people who are taking corticosteroids for 6 months or more. Xgeva is used to treat a high calcium level due to cancer and to prevent bone fractures and other bone problems caused by multiple myeloma or cancer bone metastases. Xgeva is also used to treat giant cell tumor of the bone. This medicine may be used for other purposes; ask your health care provider or pharmacist if you have questions. COMMON BRAND NAME(S): Prolia, XGEVA What should I tell my health care provider before I take this medicine? They need to know if you have any of these conditions:  dental disease  having surgery or tooth extraction  infection  kidney disease  low levels of calcium or Vitamin D in the blood  malnutrition  on hemodialysis  skin conditions or sensitivity  thyroid or parathyroid disease  an unusual reaction to denosumab, other medicines, foods, dyes, or preservatives  pregnant or trying to get pregnant  breast-feeding How should I use this medicine? This medicine is for injection under the skin. It is given by a health care professional in a hospital or clinic setting. A special MedGuide will be given to you before each treatment. Be sure to read this information carefully each time. For Prolia, talk to your pediatrician regarding the use of this medicine in children. Special care may be needed. For Xgeva, talk to your pediatrician regarding the use of this medicine in children. While this drug may be prescribed for children as young as 13 years for selected conditions, precautions do apply. Overdosage: If you think you have taken too much of this medicine contact a poison control center or emergency room at once. NOTE: This medicine is only for you. Do not share this medicine with others. What if I miss a dose? It is  important not to miss your dose. Call your doctor or health care professional if you are unable to keep an appointment. What may interact with this medicine? Do not take this medicine with any of the following medications:  other medicines containing denosumab This medicine may also interact with the following medications:  medicines that lower your chance of fighting infection  steroid medicines like prednisone or cortisone This list may not describe all possible interactions. Give your health care provider a list of all the medicines, herbs, non-prescription drugs, or dietary supplements you use. Also tell them if you smoke, drink alcohol, or use illegal drugs. Some items may interact with your medicine. What should I watch for while using this medicine? Visit your doctor or health care professional for regular checks on your progress. Your doctor or health care professional may order blood tests and other tests to see how you are doing. Call your doctor or health care professional for advice if you get a fever, chills or sore throat, or other symptoms of a cold or flu. Do not treat yourself. This drug may decrease your body's ability to fight infection. Try to avoid being around people who are sick. You should make sure you get enough calcium and vitamin D while you are taking this medicine, unless your doctor tells you not to. Discuss the foods you eat and the vitamins you take with your health care professional. See your dentist regularly. Brush and floss your teeth as directed. Before you have any dental work done, tell your dentist you are   receiving this medicine. Do not become pregnant while taking this medicine or for 5 months after stopping it. Talk with your doctor or health care professional about your birth control options while taking this medicine. Women should inform their doctor if they wish to become pregnant or think they might be pregnant. There is a potential for serious side  effects to an unborn child. Talk to your health care professional or pharmacist for more information. What side effects may I notice from receiving this medicine? Side effects that you should report to your doctor or health care professional as soon as possible:  allergic reactions like skin rash, itching or hives, swelling of the face, lips, or tongue  bone pain  breathing problems  dizziness  jaw pain, especially after dental work  redness, blistering, peeling of the skin  signs and symptoms of infection like fever or chills; cough; sore throat; pain or trouble passing urine  signs of low calcium like fast heartbeat, muscle cramps or muscle pain; pain, tingling, numbness in the hands or feet; seizures  unusual bleeding or bruising  unusually weak or tired Side effects that usually do not require medical attention (report to your doctor or health care professional if they continue or are bothersome):  constipation  diarrhea  headache  joint pain  loss of appetite  muscle pain  runny nose  tiredness  upset stomach This list may not describe all possible side effects. Call your doctor for medical advice about side effects. You may report side effects to FDA at 1-800-FDA-1088. Where should I keep my medicine? This medicine is only given in a clinic, doctor's office, or other health care setting and will not be stored at home. NOTE: This sheet is a summary. It may not cover all possible information. If you have questions about this medicine, talk to your doctor, pharmacist, or health care provider.  2020 [employer] Standard (2018-04-24 16:10:44)

## 2020-10-11 NOTE — Progress Notes (Signed)
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  From her report, this appears to have been about 3 cm and to have involved 11 of the 13 right axillary lymph nodes involved.  She was treated at [facility] by Dr.  She was treated under Dr Epimenio Foot in [facility] with lumpectomy, chemotherapy (TAC x6) , and radiation therapy.  She then tried Femara which she did not tolerate and no further antiestrogens were prescribed.  She presented to the ED yesterday, 06/15/2020, with diffuse back, chest, and abdominal pain, as well as unintentional weight loss. Chest x-ray performed at that time showed: concern for bone metastasis with pathologic fracture of left 5th rib.  She proceeded to chest, abdomen, pelvis CT that day, which showed: multifocal predominantly blastic-appearing osseous metastatic disease involving the thoracic and lumbar spine, multiple ribs most prominently at the left fifth through seventh ribs, and the right sacral ala; nonspecific mixed solid nodules and ill-defined ground-glass opacities in the left upper lobe measuring up to 7 mm and 6 mm in size; mild distal small bowel fecalization without evidence of obstruction.  Of note, her most recent mammogram on file is a bilateral diagnostic scan from 08/11/2018. At that time, she presented with a burning sensation to the inferior right breast. Mammogram showed: breast density category B; no mammographic  evidence of malignancy in the bilateral breasts.   The patient's subsequent history is as detailed below.   INTERVAL HISTORY: Catherine Tyler returns today for follow-up and treatment of her stage IV cancer. She is here unaccompanied today.  Catherine Tyler continues to have left posterior rib pain.  She is taking Morphine every 3-4 hours and is still having pain, Unfortunately, she did not respond well to long acting pain medication.  Pain is her number one priority to address today.    She continues on Capecitabine with good tolerance.  She has no nausea, skin changes, diarrhea, or any other concerns.  REVIEW OF SYSTEMS: [name] is otherwise doing ok.  She has no cough, shortness of breath.  She denies bowel/bladder changes, and a detailed ROS was otherwise non contributory.   PAST MEDICAL HISTORY: Past Medical History:  Diagnosis Date  . Cancer ([place])   . Personal history of chemotherapy   . Personal history of radiation therapy     PAST SURGICAL HISTORY: Past Surgical History:  Procedure Laterality Date  . BREAST LUMPECTOMY    . BREAST SURGERY      FAMILY HISTORY: Family History  Problem Relation Age of Onset  . Hyperlipidemia Mother   . Heart failure Mother   The patient's father died at age 63 from a myocardial infarction.  As far as the patient knows there is no history of cancer on that side of the family.  The patient's mother died at age 70 from renal failure.  The patient is not aware of any cancer on that side of the family.  The patient herself has 5 sisters, no brothers.  1 sister [name]) had lung  cancer.   GYNECOLOGIC HISTORY:  No LMP recorded (lmp unknown). Patient has had a hysterectomy. Menarche: 64 years old Age at first live birth: 64 years old [place] P 1 LMP status post hysterectomy at age 64 secondary to fibroids; status post remote conization for in situ cervical carcinoma BSO?    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  She lives by herself with her dog [name] who is a rescue.  The patient has been divorced 13 years as of June 2021.  Her son [name] lives in [place] where he works in Architect.  The patient has 1 grandchild.  She attends [employer].  ADVANCED DIRECTIVES: To be discussed   HEALTH MAINTENANCE: Social History   Tobacco Use  . Smoking status: Current Some Day Smoker  . Smokeless tobacco: Never Used  Vaping Use  . Vaping Use: Never used  Substance Use Topics  . Alcohol use: Yes    Comment: on occasion  . Drug use: Never     Colonoscopy: Never  PAP: 07/2018, negative  Bone density: Never   Allergies  Allergen Reactions  . Sulfa Antibiotics Nausea And Vomiting  . Tape Other (See Comments)    Takes off skin    Current Outpatient Medications  Medication Sig Dispense Refill  . acetaminophen (TYLENOL) 500 MG tablet Take 1 tablet (500 mg total) by mouth with breakfast, with lunch, and with evening meal. Take together with aleve 220 mg 30 tablet 0  . bisacodyl (DULCOLAX) 10 MG suppository Place 1 suppository (10 mg total) rectally as needed for moderate constipation. 12 suppository 0  . capecitabine (XELODA) 500 MG tablet Take 3 tablets (1,500 mg total) by mouth 2 (two) times daily after a meal. Tak for 14 days, then do not take for 7 days, then repeat 84 tablet 6  . lisinopril (ZESTRIL) 10 MG tablet Take 1 tablet (10 mg total) by mouth daily. 30 tablet 11  . morphine (MSIR) 15 MG tablet Take 1 tablet (15 mg total) by mouth every 4 (four) hours as needed for severe pain. 180 tablet 0  . omeprazole (PRILOSEC) 40 MG capsule Take 1 capsule (40 mg total) by mouth daily. 90 capsule 3  . ondansetron (ZOFRAN ODT) 4 MG disintegrating tablet Take 1 tablet (4 mg total) by mouth every 8 (eight) hours as needed for nausea or vomiting. 20 tablet 0  . polyethylene glycol (MIRALAX / GLYCOLAX) 17 g packet Take 17 g by mouth daily.    Marland Kitchen senna-docusate (SENOKOT-S) 8.6-50  MG tablet Take 2 tablets by mouth 2 (two) times daily. 120 tablet 0  . sodium phosphate (FLEET) 7-19 GM/118ML ENEM Place 133 mLs (1 enema total) rectally daily as needed for severe constipation. 399 mL 0   No current facility-administered medications for this visit.    OBJECTIVE: White woman who appears uncomfortable  There were no vitals filed for this visit. Wt Readings from Last 3 Encounters:  09/20/20 147 lb 3.2 oz (66.8 kg)  08/17/20 150 lb 1.6 oz (68.1 kg)  07/20/20 159 lb 4.8 oz (72.3 kg)   There is no height or weight on file to calculate BMI.    ECOG FS:1 - Symptomatic but completely ambulatory GENERAL: Patient is thin and appears uncomfortable, but non toxic HEENT:  Sclerae anicteric. Mask in place. Neck is supple.  NODES:  No cervical, supraclavicular, or axillary lymphadenopathy palpated.  LUNGS:  Clear to auscultation bilaterally.  No wheezes or rhonchi. HEART:  Regular rate and rhythm. No murmur appreciated. ABDOMEN:  Soft, nontender.  Positive, normoactive bowel sounds. No organomegaly palpated. MSK:  Left posterior chest wall + TTP EXTREMITIES:  No peripheral edema.   SKIN:  Clear with no obvious rashes or skin changes. No nail dyscrasia. NEURO:  Nonfocal. Well oriented.  Appropriate affect.    LAB RESULTS:  CMP     Component Value Date/Time   NA 138 09/20/2020 1444   NA 140 09/02/2018 1106   K 4.2 09/20/2020 1444   CL 104 09/20/2020 1444   CO2 30 09/20/2020 1444   GLUCOSE 107 (H) 09/20/2020 1444   BUN 12 09/20/2020 1444   BUN 22 09/02/2018 1106   CREATININE 0.71 09/20/2020 1444   CALCIUM 9.4 09/20/2020 1444   PROT 6.6 09/20/2020 1444   ALBUMIN 3.4 (L) 09/20/2020 1444   AST 21 09/20/2020 1444   ALT 16 09/20/2020 1444   ALKPHOS 223 (H) 09/20/2020 1444   BILITOT 0.4 09/20/2020 1444   GFRNONAA >60 09/20/2020 1444   GFRAA >60 09/20/2020 1444    No results found for: TOTALPROTELP, ALBUMINELP, A1GS, A2GS, BETS, BETA2SER, GAMS, MSPIKE, SPEI  Lab  Results  Component Value Date   WBC 6.9 09/20/2020   NEUTROABS 3.6 09/20/2020   HGB 13.4 09/20/2020   HCT 40.4 09/20/2020   MCV 89.8 09/20/2020   PLT 205 09/20/2020    No results found for: LABCA2  No components found for: WNIOEV035  No results for input(s): INR in the last 168 hours.  No results found for: LABCA2  No results found for: CAN199  No results found for: KKX381  No results found for: WEX937  Lab Results  Component Value Date   CA2729 435.2 (H) 09/20/2020    No components found for: HGQUANT  Lab Results  Component Value Date   CEA1 1,122.94 (H) 09/20/2020   /  CEA ([facility])  Date Value Ref Range Status  09/20/2020 1,122.94 (H) 0.00 - 5.00 ng/mL Final    Comment:    (NOTE) This test was performed using Architect's Chemiluminescent Microparticle Immunoassay. Values obtained from different assay methods cannot be used interchangeably. Please note that 5-10% of patients who smoke may see CEA levels up to 6.9 ng/mL. Performed at [facility], [place] [address], [place], [place] [address]      No results found for: AFPTUMOR  No results found for: CHROMOGRNA  No results found for: KPAFRELGTCHN, LAMBDASER, KAPLAMBRATIO (kappa/lambda light chains)  No results found for: HGBA, HGBA2QUANT, HGBFQUANT, HGBSQUAN (Hemoglobinopathy evaluation)   No results found for: LDH  No results found for: IRON, TIBC, IRONPCTSAT (Iron and TIBC)  No results found for: FERRITIN  Urinalysis    Component Value Date/Time   COLORURINE STRAW (A) 04/27/2020 0928   APPEARANCEUR CLEAR 04/27/2020 0928   LABSPEC 1.015 05/30/2020 1402   PHURINE 6.0 05/30/2020 1402   GLUCOSEU NEGATIVE 05/30/2020 1402   HGBUR NEGATIVE 05/30/2020 1402   BILIRUBINUR NEGATIVE 05/30/2020 1402   KETONESUR NEGATIVE 05/30/2020 1402   PROTEINUR NEGATIVE 05/30/2020 1402   UROBILINOGEN 0.2 05/30/2020 1402   NITRITE NEGATIVE 05/30/2020 1402   LEUKOCYTESUR NEGATIVE  05/30/2020 1402     STUDIES: No results found.   ELIGIBLE FOR AVAILABLE RESEARCH PROTOCOL: no  ASSESSMENT: 64 y.o. [place] woman   (1) status post right lumpectomy and axillary lymph node dissection in 2007 for a stage III invasive breast cancer, additional pathology details to be reviewed when available  (a) a  total of 13 axillary lymph nodes were removed, 11 positive  (b) adjuvant chemotherapy consisted of docetaxel, doxorubicin and cyclophosphamide x6  (c) the patient received adjuvant radiation  (d) received letrozole briefly but did not tolerate it.  METASTATIC DISEASE (2) CT scans of the chest abdomen and pelvis 06/15/2020 shows widespread bony metastatic disease, no definitive visceral disease  (a) bone marrow biopsy  06/20/2020 nondiagnostic  (b) F 18 estradiol PET scan (cerianna) 07/31/2020 shows no estrogen uptake in the known bone lesions or elsewhere  (3) Capecitabine to start on 08/24/2020 at 1500 mg twice daily, 14 days on, 7 days off  (a) Xgeva started on 07/13/2020 given every 4 weeks    PLAN: Catherine Tyler continues on Capecitabine with good tolerance.  She will continue this and we are following tumor markers and will plan for restaging after 3-4 cycles of treatment.  Unfortunately her pain is still uncontrolled and it is in the areas on her posterior ribs that have breast cancer metastases.  I have placed a referral for radiation oncology evaluation.  She will continue on PRN morphine.    I will not add any additional treatment due to her increased somnolence with long acting morphine.    She will continue to receive Xgeva every 4 weeks.    We will see her back in 4 weeks for labs, f/u, and injection.  I reviewed the above with Dr. Jana Hakim who was in agreement.  She knows to call for any questions that may arise between now and her next appointment.  We are happy to see her sooner if needed.   Total encounter time 20 minutes.Wilber Bihari, NP 10/11/20 9:04  AM Medical Oncology and Hematology [facility] [place], [place] [address] Tel. [phone]    Fax. [phone]    *Total Encounter Time as defined by the Centers for Medicare and Medicaid Services includes, in addition to the face-to-face time of a patient visit (documented in the note above) non-face-to-face time: obtaining and reviewing outside history, ordering and reviewing medications, tests or procedures, care coordination (communications with other health care professionals or caregivers) and documentation in the medical record.

## 2020-10-12 LAB — CEA (IN HOUSE-CHCC): CEA (CHCC-In House): 1184.52 ng/mL — ABNORMAL HIGH (ref 0.00–5.00)

## 2020-10-12 LAB — CANCER ANTIGEN 27.29: CA 27.29: 425.1 U/mL — ABNORMAL HIGH (ref 0.0–38.6)

## 2020-10-16 NOTE — Progress Notes (Signed)
Histology and Location of Primary Cancer: Bone Mets  "Catherine Tyler" has a history of right breast cancer, diagnosed in 2007.  From her report, this appears to have been about 3 cm and to have involved 11 of the 13 right axillary lymph nodes involved.  She was treated at [facility] by Dr.  She was treated under Dr Epimenio Foot in [facility] with lumpectomy, chemotherapy (TAC x6) , and radiation therapy.  She then tried Femara which she did not tolerate and no further antiestrogens were prescribed.   Location(s) of Symptomatic Metastases: -Thoracic and lumbar spine, multiple ribs most prominently at the left 5-7 ribs, right sacral ala.   PET 07/31/2020: No evidence of estrogen positive receptor breast cancer recurrence on whole-body F 18 estradiol PET scan.  Multiple blastic lesions within the ribs, spine, and sacrum without associated radiotracer activity. Findings most suggestive of estrogen receptor negative breast cancer metastasis. Small upper lobe pulmonary nodule on RIGHT without radiotracer activity.  CT Head 07/14/2020: No evidence of intracranial metastases.  CT CAP 06/15/2020:  Multifocal predominantly blastic appearing osseous metastatic disease involving the thoracic and lumbar spine, multiple ribs most prominently at the left fifth through seventh ribs, and the right sacral ala. Consider outpatient bone scan for further evaluation and detection additional metastatic foci.  Mixed solid nodules and ill-defined ground-glass opacities in the left upper lobe measuring up to 7 mm and 6 mm in size, nonspecific possibly reflecting infectious or inflammatory change, primary lung process, or metastatic disease.      Past/Anticipated chemotherapy by medical oncology, if any:  NP [place]/ Dr. Jana Hakim 10/11/2020 Capecitabine to start on 08/24/2020 at 1500 mg twice daily, 14 days on, 7 days off             (a) Xgeva started on 07/13/2020 given every 4 weeks  Pain on a scale of 0-10 is: 7/10 in  her left flank/back about the bra line.   Ambulatory status? Walker? Wheelchair?: Ambulatory  SAFETY ISSUES:  Prior radiation? Right Breast- Done in [place] 2007  Pacemaker/ICD? No  Possible current pregnancy? Hysterectomy  Is the patient on methotrexate? no  Current Complaints / other details:   - Previous chemo-radiation- surgery

## 2020-10-17 ENCOUNTER — Encounter: Payer: Self-pay | Admitting: Radiation Oncology

## 2020-10-17 ENCOUNTER — Ambulatory Visit
Admission: RE | Admit: 2020-10-17 | Discharge: 2020-10-17 | Disposition: A | Discharge status: Home / Self Care | Payer: Medicaid Other | Source: Ambulatory Visit | Attending: Radiation Oncology | Admitting: Radiation Oncology

## 2020-10-17 ENCOUNTER — Telehealth: Payer: Self-pay

## 2020-10-17 ENCOUNTER — Other Ambulatory Visit: Payer: Self-pay

## 2020-10-17 VITALS — BP 117/74 | HR 73 | Temp 98.2°F | Resp 18 | Ht 65.0 in | Wt 145.4 lb

## 2020-10-17 DIAGNOSIS — Z923 Personal history of irradiation: Secondary | ICD-10-CM | POA: Diagnosis not present

## 2020-10-17 DIAGNOSIS — Z170 Estrogen receptor positive status [ER+]: Secondary | ICD-10-CM | POA: Insufficient documentation

## 2020-10-17 DIAGNOSIS — C50811 Malignant neoplasm of overlapping sites of right female breast: Secondary | ICD-10-CM | POA: Insufficient documentation

## 2020-10-17 DIAGNOSIS — F17210 Nicotine dependence, cigarettes, uncomplicated: Secondary | ICD-10-CM | POA: Diagnosis not present

## 2020-10-17 DIAGNOSIS — Z510 Encounter for antineoplastic radiation therapy: Secondary | ICD-10-CM | POA: Diagnosis present

## 2020-10-17 DIAGNOSIS — G893 Neoplasm related pain (acute) (chronic): Secondary | ICD-10-CM | POA: Insufficient documentation

## 2020-10-17 DIAGNOSIS — Z79899 Other long term (current) drug therapy: Secondary | ICD-10-CM | POA: Insufficient documentation

## 2020-10-17 DIAGNOSIS — C7951 Secondary malignant neoplasm of bone: Secondary | ICD-10-CM | POA: Insufficient documentation

## 2020-10-17 DIAGNOSIS — Z9221 Personal history of antineoplastic chemotherapy: Secondary | ICD-10-CM | POA: Diagnosis not present

## 2020-10-17 DIAGNOSIS — C50911 Malignant neoplasm of unspecified site of right female breast: Secondary | ICD-10-CM | POA: Diagnosis present

## 2020-10-17 MED ORDER — GABAPENTIN 300 MG PO CAPS: ORAL_CAPSULE | ORAL | 1 refills | Status: DC

## 2020-10-17 NOTE — Telephone Encounter (Signed)
error 

## 2020-10-17 NOTE — Progress Notes (Addendum)
Radiation Oncology         [phone] ________________________________  Name: Catherine Tyler        MRN: [id number]  Date of Service: 10/17/2020 DOB: Oct 19, 1956  QT:[id number], No Pcp Per  Magrinat, Virgie Dad, MD     REFERRING PHYSICIAN: Magrinat, Virgie Dad, MD   DIAGNOSIS: The encounter diagnosis was Malignant neoplasm of overlapping sites of right breast in female, estrogen receptor positive ([place]).   HISTORY OF PRESENT ILLNESS: Catherine Tyler is a 64 y.o. female seen at the request of Dr. 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 She was found in June 2021 to have progressive symptoms of pain in the chest wall and back and imaging showed concerns for possible recurrence of disease. She did undergo a bone marrow biopsy on 06/20/20 that was nondiagnostic. A [place]  PET scan on 07/31/20 showed no estrogen uptake in her bone disease seen by CT scan but she does have widespread bony metastatic disease especially in the sacrum and ribs. She was started on Xeloda on 08/24/20. She is seen today to discuss treatment with palliative radiotherapy.    PREVIOUS RADIATION THERAPY: Yes   2007: The patient received adjuvant radiotherapy to the right breast and regional nodes with Dr. Garnetta Buddy in [place], [place]. Records pending.   PAST MEDICAL HISTORY:  Past Medical History:  Diagnosis Date  . Cancer ([place])   . Personal history of chemotherapy   . Personal history of radiation therapy        PAST SURGICAL HISTORY: Past Surgical History:  Procedure Laterality Date  . BREAST LUMPECTOMY    . BREAST SURGERY       FAMILY HISTORY:  Family History  Problem Relation Age of Onset  . Hyperlipidemia Mother   . Heart failure Mother      SOCIAL HISTORY:  reports that she has been smoking. She has  never used smokeless tobacco. She reports current alcohol use. She reports that she does not use drugs. The patient is single and lives in [place]. She is a Biomedical scientist by trade.   ALLERGIES: Demerol [meperidine], Sulfa antibiotics, and Tape   MEDICATIONS:  Current Outpatient Medications  Medication Sig Dispense Refill  . acetaminophen (TYLENOL) 500 MG tablet Take 1 tablet (500 mg total) by mouth with breakfast, with lunch, and with evening meal. Take together with aleve 220 mg 30 tablet 0  . bisacodyl (DULCOLAX) 10 MG suppository Place 1 suppository (10 mg total) rectally as needed for moderate constipation. 12 suppository 0  . capecitabine (XELODA) 500 MG tablet Take 3 tablets (1,500 mg total) by mouth 2 (two) times daily after a meal. Tak for 14 days, then do not take for 7 days, then repeat 84 tablet 6  . [employer] MAGNESIUM CITRATE 1.745 GM/30ML SOLN Take 296 mLs by mouth once.    Marland Kitchen lisinopril (ZESTRIL) 10 MG tablet Take 1 tablet (10 mg total) by mouth daily. 30 tablet 11  . morphine (MSIR) 15 MG tablet Take 1 tablet (15 mg total) by mouth every 4 (four) hours as needed for severe pain. 180 tablet 0  . omeprazole (PRILOSEC) 40 MG capsule Take 1 capsule (40 mg total) by mouth daily. 90 capsule 3  . ondansetron (ZOFRAN ODT) 4 MG disintegrating tablet Take 1 tablet (4 mg total) by mouth every 8 (eight) hours  as needed for nausea or vomiting. 20 tablet 0  . polyethylene glycol (MIRALAX / GLYCOLAX) 17 g packet Take 17 g by mouth daily.    Marland Kitchen senna-docusate (SENOKOT-S) 8.6-50 MG tablet Take 2 tablets by mouth 2 (two) times daily. 120 tablet 0  . sodium phosphate (FLEET) 7-19 GM/118ML ENEM Place 133 mLs (1 enema total) rectally daily as needed for severe constipation. 399 mL 0   No current facility-administered medications for this encounter.     REVIEW OF SYSTEMS: On review of systems, the patient reports that she is doing well overall. She does have pain in her left chest wall from the side of her left  aspect of the T spine along the level of the 5-7th rib region along the left chest wall. It is described as a burning pain. She denies any sternal chest pain, shortness of breath, cough, fevers, chills, night sweats, unintended weight changes. She denies any bowel or bladder disturbances, and denies abdominal pain, nausea or vomiting. She does have occasional right low back pain. She denies any new musculoskeletal or joint aches or pains. A complete review of systems is obtained and is otherwise negative.     PHYSICAL EXAM:  Wt Readings from Last 3 Encounters:  10/17/20 145 lb 6.4 oz (66 kg)  10/11/20 146 lb 8 oz (66.5 kg)  09/20/20 147 lb 3.2 oz (66.8 kg)   Temp Readings from Last 3 Encounters:  10/17/20 98.2 F (36.8 C)  10/11/20 (!) 97.4 F (36.3 C) (Tympanic)  09/20/20 (!) 97.4 F (36.3 C) (Tympanic)   BP Readings from Last 3 Encounters:  10/17/20 117/74  10/11/20 135/82  09/20/20 (!) 155/81   Pulse Readings from Last 3 Encounters:  10/17/20 73  10/11/20 65  09/20/20 70   Pain Assessment Pain Score: 6  Pain Loc: Back (Left side of her back about the bra line)/10  In general this is a well appearing caucasian female in no acute distress. She's alert and oriented x4 and appropriate throughout the examination. Cardiopulmonary assessment is negative for acute distress and she exhibits normal effort. She points out the 5-7th rib region on the left chest wall that is most painful.    ECOG = 1  0 - Asymptomatic (Fully active, able to carry on all predisease activities without restriction)  1 - Symptomatic but completely ambulatory (Restricted in physically strenuous activity but ambulatory and able to carry out work of a light or sedentary nature. For example, light housework, office work)  2 - Symptomatic, <50% in bed during the day (Ambulatory and capable of all self care but unable to carry out any work activities. Up and about more than 50% of waking hours)  3 -  Symptomatic, >50% in bed, but not bedbound (Capable of only limited self-care, confined to bed or chair 50% or more of waking hours)  4 - Bedbound (Completely disabled. Cannot carry on any self-care. Totally confined to bed or chair)  5 - Death   Eustace Pen MM, Creech RH, Tormey DC, et al. [phone]). "Toxicity and response criteria of the [facility]". [place] Oncol. 5 (6): 649-55    LABORATORY DATA:  Lab Results  Component Value Date   WBC 8.0 10/11/2020   HGB 14.1 10/11/2020   HCT 42.5 10/11/2020   MCV 89.5 10/11/2020   PLT 178 10/11/2020   Lab Results  Component Value Date   NA 136 10/11/2020   K 4.3 10/11/2020   CL 103 10/11/2020   CO2  30 10/11/2020   Lab Results  Component Value Date   ALT 26 10/11/2020   AST 32 10/11/2020   ALKPHOS 182 (H) 10/11/2020   BILITOT 0.4 10/11/2020      RADIOGRAPHY: No results found.     IMPRESSION/PLAN: 1. Recurrent Metastatic ER positive invasive ductal carcinoma of the right breast with widespread bony disease. Dr. Lisbeth Renshaw discusses the pathology findings and reviews the nature of metastatic bony disease. We discussed the risks, benefits, short, and long term effects of radiotherapy, and the patient is interested in proceeding. Dr. Lisbeth Renshaw discusses the delivery and logistics of radiotherapy and anticipates a course of 3 weeks of radiotherapy given her ongoing xeloda to the left ribs, and sacrum. Written consent is obtained and placed in the chart, a copy was provided to the patient. She will simulate today. 2. Pain secondary to #1. Dr. Lisbeth Renshaw discussed the rationale to use gabapentin and a new rx will be sent into her pharmacy.  In a visit lasting 60 minutes, greater than 50% of the time was spent face to face discussing the patient's condition, in preparation for the discussion, and coordinating the patient's care.   The above documentation reflects my direct findings during this shared patient visit. Please see the  separate note by Dr. Lisbeth Renshaw on this date for the remainder of the patient's plan of care.    Carola Rhine, PAC

## 2020-10-20 DIAGNOSIS — Z510 Encounter for antineoplastic radiation therapy: Secondary | ICD-10-CM | POA: Diagnosis not present

## 2020-10-23 ENCOUNTER — Ambulatory Visit
Admission: RE | Admit: 2020-10-23 | Discharge: 2020-10-23 | Disposition: A | Discharge status: Home / Self Care | Payer: Medicaid Other | Source: Ambulatory Visit | Attending: Radiation Oncology | Admitting: Radiation Oncology

## 2020-10-23 DIAGNOSIS — Z510 Encounter for antineoplastic radiation therapy: Secondary | ICD-10-CM | POA: Diagnosis not present

## 2020-10-23 MED FILL — CAPECITABINE 500 MG TABLET: 500 | 21 days supply | Qty: 84 | Fill #3

## 2020-10-24 ENCOUNTER — Other Ambulatory Visit: Payer: Self-pay | Admitting: Oncology

## 2020-10-24 ENCOUNTER — Ambulatory Visit
Admission: RE | Admit: 2020-10-24 | Discharge: 2020-10-24 | Disposition: A | Discharge status: Home / Self Care | Payer: Medicaid Other | Source: Ambulatory Visit | Attending: Radiation Oncology | Admitting: Radiation Oncology

## 2020-10-24 ENCOUNTER — Telehealth: Payer: Self-pay

## 2020-10-24 DIAGNOSIS — G893 Neoplasm related pain (acute) (chronic): Secondary | ICD-10-CM

## 2020-10-24 DIAGNOSIS — Z170 Estrogen receptor positive status [ER+]: Secondary | ICD-10-CM

## 2020-10-24 DIAGNOSIS — Z510 Encounter for antineoplastic radiation therapy: Secondary | ICD-10-CM | POA: Diagnosis not present

## 2020-10-24 DIAGNOSIS — C7951 Secondary malignant neoplasm of bone: Secondary | ICD-10-CM

## 2020-10-24 DIAGNOSIS — C50811 Malignant neoplasm of overlapping sites of right female breast: Secondary | ICD-10-CM

## 2020-10-24 MED ORDER — MORPHINE SULFATE 15 MG PO TABS: 15.0000 mg | ORAL_TABLET | ORAL | 0 refills | Status: DC | PRN

## 2020-10-24 NOTE — Telephone Encounter (Signed)
Pt called and LVM asking if she should continue Capeceitabine abd asking fro MSIR 15 mg refill. This refill request was given to Dr Jana Hakim, and this LPN attempted to call pt back, but no answer. LVM for pt to return my call.

## 2020-10-25 ENCOUNTER — Telehealth: Payer: Self-pay | Admitting: *Deleted

## 2020-10-25 ENCOUNTER — Ambulatory Visit
Admission: RE | Admit: 2020-10-25 | Discharge: 2020-10-25 | Disposition: A | Discharge status: Home / Self Care | Payer: Medicaid Other | Source: Ambulatory Visit | Attending: Radiation Oncology | Admitting: Radiation Oncology

## 2020-10-25 DIAGNOSIS — Z510 Encounter for antineoplastic radiation therapy: Secondary | ICD-10-CM | POA: Diagnosis not present

## 2020-10-25 NOTE — Telephone Encounter (Signed)
Pt called to see how she supposed to take Xeloda while on Radiation.   Dr Jana Hakim states she is to take 1000 mg twice a day on RT days only, do not take on weekends. Pt verbalized understanding

## 2020-10-26 ENCOUNTER — Ambulatory Visit
Admission: RE | Admit: 2020-10-26 | Discharge: 2020-10-26 | Disposition: A | Discharge status: Home / Self Care | Payer: Medicaid Other | Source: Ambulatory Visit | Attending: Radiation Oncology | Admitting: Radiation Oncology

## 2020-10-26 DIAGNOSIS — Z510 Encounter for antineoplastic radiation therapy: Secondary | ICD-10-CM | POA: Diagnosis not present

## 2020-10-27 ENCOUNTER — Ambulatory Visit
Admission: RE | Admit: 2020-10-27 | Discharge: 2020-10-27 | Disposition: A | Discharge status: Home / Self Care | Payer: Medicaid Other | Source: Ambulatory Visit | Attending: Radiation Oncology | Admitting: Radiation Oncology

## 2020-10-27 DIAGNOSIS — Z510 Encounter for antineoplastic radiation therapy: Secondary | ICD-10-CM | POA: Diagnosis not present

## 2020-10-30 ENCOUNTER — Other Ambulatory Visit: Payer: Self-pay

## 2020-10-30 ENCOUNTER — Ambulatory Visit
Admission: RE | Admit: 2020-10-30 | Discharge: 2020-10-30 | Disposition: A | Discharge status: Home / Self Care | Payer: Medicaid Other | Source: Ambulatory Visit | Attending: Radiation Oncology | Admitting: Radiation Oncology

## 2020-10-30 DIAGNOSIS — C7951 Secondary malignant neoplasm of bone: Secondary | ICD-10-CM | POA: Diagnosis present

## 2020-10-30 DIAGNOSIS — Z170 Estrogen receptor positive status [ER+]: Secondary | ICD-10-CM | POA: Insufficient documentation

## 2020-10-30 DIAGNOSIS — C50911 Malignant neoplasm of unspecified site of right female breast: Secondary | ICD-10-CM | POA: Diagnosis present

## 2020-10-30 DIAGNOSIS — Z510 Encounter for antineoplastic radiation therapy: Secondary | ICD-10-CM | POA: Diagnosis not present

## 2020-10-31 ENCOUNTER — Ambulatory Visit
Admission: RE | Admit: 2020-10-31 | Discharge: 2020-10-31 | Disposition: A | Discharge status: Home / Self Care | Payer: Medicaid Other | Source: Ambulatory Visit | Attending: Radiation Oncology | Admitting: Radiation Oncology

## 2020-10-31 ENCOUNTER — Other Ambulatory Visit: Payer: Self-pay

## 2020-10-31 DIAGNOSIS — Z510 Encounter for antineoplastic radiation therapy: Secondary | ICD-10-CM | POA: Diagnosis not present

## 2020-11-01 ENCOUNTER — Ambulatory Visit
Admission: RE | Admit: 2020-11-01 | Discharge: 2020-11-01 | Disposition: A | Discharge status: Home / Self Care | Payer: Medicaid Other | Source: Ambulatory Visit | Attending: Radiation Oncology | Admitting: Radiation Oncology

## 2020-11-01 ENCOUNTER — Inpatient Hospital Stay: Payer: Medicaid Other

## 2020-11-01 ENCOUNTER — Other Ambulatory Visit: Payer: Self-pay

## 2020-11-01 ENCOUNTER — Inpatient Hospital Stay: Payer: Medicaid Other | Attending: Oncology | Admitting: Oncology

## 2020-11-01 VITALS — BP 121/77 | HR 71 | Temp 97.8°F | Resp 18 | Ht 65.0 in | Wt 145.4 lb

## 2020-11-01 DIAGNOSIS — F17200 Nicotine dependence, unspecified, uncomplicated: Secondary | ICD-10-CM | POA: Diagnosis not present

## 2020-11-01 DIAGNOSIS — C50811 Malignant neoplasm of overlapping sites of right female breast: Secondary | ICD-10-CM | POA: Diagnosis not present

## 2020-11-01 DIAGNOSIS — R52 Pain, unspecified: Secondary | ICD-10-CM | POA: Diagnosis not present

## 2020-11-01 DIAGNOSIS — Z170 Estrogen receptor positive status [ER+]: Secondary | ICD-10-CM

## 2020-11-01 DIAGNOSIS — G893 Neoplasm related pain (acute) (chronic): Secondary | ICD-10-CM

## 2020-11-01 DIAGNOSIS — C7951 Secondary malignant neoplasm of bone: Secondary | ICD-10-CM

## 2020-11-01 DIAGNOSIS — C50911 Malignant neoplasm of unspecified site of right female breast: Secondary | ICD-10-CM | POA: Diagnosis present

## 2020-11-01 DIAGNOSIS — Z510 Encounter for antineoplastic radiation therapy: Secondary | ICD-10-CM | POA: Diagnosis not present

## 2020-11-01 LAB — CBC WITH DIFFERENTIAL/PLATELET
Abs Immature Granulocytes: 0.03 10*3/uL (ref 0.00–0.07)
Basophils Absolute: 0.1 10*3/uL (ref 0.0–0.1)
Basophils Relative: 1 %
Eosinophils Absolute: 0.1 10*3/uL (ref 0.0–0.5)
Eosinophils Relative: 2 %
HCT: 44.3 % (ref 36.0–46.0)
Hemoglobin: 14.7 g/dL (ref 12.0–15.0)
Immature Granulocytes: 1 %
Lymphocytes Relative: 13 %
Lymphs Abs: 0.7 10*3/uL (ref 0.7–4.0)
MCH: 30.4 pg (ref 26.0–34.0)
MCHC: 33.2 g/dL (ref 30.0–36.0)
MCV: 91.5 fL (ref 80.0–100.0)
Monocytes Absolute: 0.4 10*3/uL (ref 0.1–1.0)
Monocytes Relative: 6 %
Neutro Abs: 4.3 10*3/uL (ref 1.7–7.7)
Neutrophils Relative %: 77 %
Platelets: 172 10*3/uL (ref 150–400)
RBC: 4.84 MIL/uL (ref 3.87–5.11)
RDW: 16.1 % — ABNORMAL HIGH (ref 11.5–15.5)
WBC: 5.5 10*3/uL (ref 4.0–10.5)
nRBC: 0 % (ref 0.0–0.2)

## 2020-11-01 LAB — COMPREHENSIVE METABOLIC PANEL
ALT: 30 U/L (ref 0–44)
AST: 31 U/L (ref 15–41)
Albumin: 3.7 g/dL (ref 3.5–5.0)
Alkaline Phosphatase: 194 U/L — ABNORMAL HIGH (ref 38–126)
Anion gap: 6 (ref 5–15)
BUN: 12 mg/dL (ref 8–23)
CO2: 26 mmol/L (ref 22–32)
Calcium: 8.6 mg/dL — ABNORMAL LOW (ref 8.9–10.3)
Chloride: 104 mmol/L (ref 98–111)
Creatinine, Ser: 0.69 mg/dL (ref 0.44–1.00)
GFR, Estimated: >60 mL/min (ref >60)
Glucose, Bld: 113 mg/dL — ABNORMAL HIGH (ref 70–99)
Potassium: 4.5 mmol/L (ref 3.5–5.1)
Sodium: 136 mmol/L (ref 135–145)
Total Bilirubin: 0.6 mg/dL (ref 0.3–1.2)
Total Protein: 7.1 g/dL (ref 6.5–8.1)

## 2020-11-01 MED ORDER — DENOSUMAB 120 MG/1.7ML ~~LOC~~ SOLN
SUBCUTANEOUS | Status: AC
  Filled 2020-11-01: qty 1.7

## 2020-11-01 MED ORDER — METHADONE HCL 5 MG PO TABS
5.0000 mg | ORAL_TABLET | Freq: Three times a day (TID) | ORAL | 0 refills | Status: DC
Start: 2020-11-01 — End: 2020-12-28

## 2020-11-01 MED ORDER — DENOSUMAB 120 MG/1.7ML ~~LOC~~ SOLN: 120.0000 mg | Freq: Once | SUBCUTANEOUS | Status: DC

## 2020-11-01 NOTE — Patient Instructions (Signed)
Denosumab injection What is this medicine? DENOSUMAB (den oh sue mab) slows bone breakdown. Prolia is used to treat osteoporosis in women after menopause and in men, and in people who are taking corticosteroids for 6 months or more. Xgeva is used to treat a high calcium level due to cancer and to prevent bone fractures and other bone problems caused by multiple myeloma or cancer bone metastases. Xgeva is also used to treat giant cell tumor of the bone. This medicine may be used for other purposes; ask your health care provider or pharmacist if you have questions. COMMON BRAND NAME(S): Prolia, XGEVA What should I tell my health care provider before I take this medicine? They need to know if you have any of these conditions:  dental disease  having surgery or tooth extraction  infection  kidney disease  low levels of calcium or Vitamin D in the blood  malnutrition  on hemodialysis  skin conditions or sensitivity  thyroid or parathyroid disease  an unusual reaction to denosumab, other medicines, foods, dyes, or preservatives  pregnant or trying to get pregnant  breast-feeding How should I use this medicine? This medicine is for injection under the skin. It is given by a health care professional in a hospital or clinic setting. A special MedGuide will be given to you before each treatment. Be sure to read this information carefully each time. For Prolia, talk to your pediatrician regarding the use of this medicine in children. Special care may be needed. For Xgeva, talk to your pediatrician regarding the use of this medicine in children. While this drug may be prescribed for children as Jakoby Melendrez as 13 years for selected conditions, precautions do apply. Overdosage: If you think you have taken too much of this medicine contact a poison control center or emergency room at once. NOTE: This medicine is only for you. Do not share this medicine with others. What if I miss a dose? It is  important not to miss your dose. Call your doctor or health care professional if you are unable to keep an appointment. What may interact with this medicine? Do not take this medicine with any of the following medications:  other medicines containing denosumab This medicine may also interact with the following medications:  medicines that lower your chance of fighting infection  steroid medicines like prednisone or cortisone This list may not describe all possible interactions. Give your health care provider a list of all the medicines, herbs, non-prescription drugs, or dietary supplements you use. Also tell them if you smoke, drink alcohol, or use illegal drugs. Some items may interact with your medicine. What should I watch for while using this medicine? Visit your doctor or health care professional for regular checks on your progress. Your doctor or health care professional may order blood tests and other tests to see how you are doing. Call your doctor or health care professional for advice if you get a fever, chills or sore throat, or other symptoms of a cold or flu. Do not treat yourself. This drug may decrease your body's ability to fight infection. Try to avoid being around people who are sick. You should make sure you get enough calcium and vitamin D while you are taking this medicine, unless your doctor tells you not to. Discuss the foods you eat and the vitamins you take with your health care professional. See your dentist regularly. Brush and floss your teeth as directed. Before you have any dental work done, tell your dentist you are   receiving this medicine. Do not become pregnant while taking this medicine or for 5 months after stopping it. Talk with your doctor or health care professional about your birth control options while taking this medicine. Women should inform their doctor if they wish to become pregnant or think they might be pregnant. There is a potential for serious side  effects to an unborn child. Talk to your health care professional or pharmacist for more information. What side effects may I notice from receiving this medicine? Side effects that you should report to your doctor or health care professional as soon as possible:  allergic reactions like skin rash, itching or hives, swelling of the face, lips, or tongue  bone pain  breathing problems  dizziness  jaw pain, especially after dental work  redness, blistering, peeling of the skin  signs and symptoms of infection like fever or chills; cough; sore throat; pain or trouble passing urine  signs of low calcium like fast heartbeat, muscle cramps or muscle pain; pain, tingling, numbness in the hands or feet; seizures  unusual bleeding or bruising  unusually weak or tired Side effects that usually do not require medical attention (report to your doctor or health care professional if they continue or are bothersome):  constipation  diarrhea  headache  joint pain  loss of appetite  muscle pain  runny nose  tiredness  upset stomach This list may not describe all possible side effects. Call your doctor for medical advice about side effects. You may report side effects to FDA at 1-800-FDA-1088. Where should I keep my medicine? This medicine is only given in a clinic, doctor's office, or other health care setting and will not be stored at home. NOTE: This sheet is a summary. It may not cover all possible information. If you have questions about this medicine, talk to your doctor, pharmacist, or health care provider.  2020 Elsevier/Gold Standard (2018-04-24 16:10:44)

## 2020-11-01 NOTE — Progress Notes (Signed)
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   She continues on Capecitabine with good tolerance.  She is taking it at radiation sensitization doses namely 2 tablets twice daily on radiation treatment only.  She has no nausea, skin changes, diarrhea, or any other concerns so far on this medication.  Since her last visit, she was referred to Dr. Lisbeth Tyler on 10/17/2020 to discuss radiation therapy to the painful rib metastasis. She began treatment on 10/23/2020 and is scheduled through 11/10/2020.   REVIEW OF SYSTEMS: Catherine Tyler continues to be in pain.  This is very variable some days she has a lot of pain other days much less.  She does not know what makes the difference.  And days when she has little pain she takes morphine 15 mg twice.  Other days she takes up to 6 or 8 tablets daily.  She is not currently constipated.  She is not aware of any significant skin changes from the radiation.  She does not think the pain is being helped by the radiation at least not yet.   COVID 19 VACCINATION STATUS:    HISTORY OF CURRENT ILLNESS: From the original intake note:  "Catherine Tyler" has a history of right breast cancer, diagnosed in 2007.  From her report, this appears to have been about 3 cm and to have involved 11 of the 13 right axillary lymph nodes involved.  She was treated at [facility] by Dr.  She was treated under Dr Epimenio Foot in [facility] with lumpectomy,  chemotherapy (TAC x6) , and radiation therapy.  She then tried Femara which she did not tolerate and no further antiestrogens were prescribed.  She presented to the ED yesterday, 06/15/2020, with diffuse back, chest, and abdominal pain, as well as unintentional weight loss. Chest x-ray performed at that time showed: concern for bone metastasis with pathologic fracture of left 5th rib.  She proceeded to chest, abdomen, pelvis CT that day, which showed: multifocal predominantly blastic-appearing osseous metastatic disease involving the thoracic and lumbar spine, multiple ribs most prominently at the left fifth through seventh ribs, and the right sacral ala; nonspecific mixed solid nodules and ill-defined ground-glass opacities in the left upper lobe measuring up to 7 mm and 6 mm in size; mild distal small bowel fecalization without evidence of obstruction.  Of note, her most recent mammogram on file is a bilateral diagnostic scan from 08/11/2018. At that time, she presented with a burning sensation to the inferior right breast. Mammogram showed: breast density category B; no mammographic evidence of malignancy in the bilateral breasts.   The patient's subsequent history is as detailed below.   PAST MEDICAL HISTORY: Past Medical History:  Diagnosis Date  . Cancer ([place])   . Personal history of chemotherapy   . Personal history of radiation therapy     PAST SURGICAL HISTORY: Past Surgical History:  Procedure Laterality Date  . BREAST LUMPECTOMY    . BREAST SURGERY      FAMILY HISTORY: Family History  Problem Relation  Age of Onset  . Hyperlipidemia Mother   . Heart failure Mother   The patient's father died at age 43 from a myocardial infarction.  As far as the patient knows there is no history of cancer on that side of the family.  The patient's mother died at age 62 from renal failure.  The patient is not aware of any cancer on that side of the family.  The patient herself has 5 sisters, no  brothers.  1 sister Catherine Tyler) had lung cancer.   GYNECOLOGIC HISTORY:  No LMP recorded (lmp unknown). Patient has had a hysterectomy. Menarche: 64 years old Age at first live birth: 64 years old Catherine Tyler 1 LMP status post hysterectomy at age 35 secondary to fibroids; status post remote conization for in situ cervical carcinoma BSO?    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  She lives by herself with her dog Catherine Tyler who is a rescue.  The patient has been divorced 13 years as of June 2021.  Her son Catherine Tyler lives in [place] where he works in Architect.  The patient has 1 grandchild.  She attends [employer].  ADVANCED DIRECTIVES: To be discussed   HEALTH MAINTENANCE: Social History   Tobacco Use  . Smoking status: Current Some Day Smoker  . Smokeless tobacco: Never Used  Vaping Use  . Vaping Use: Never used  Substance Use Topics  . Alcohol use: Yes    Comment: on occasion  . Drug use: Never     Colonoscopy: Never  PAP: 07/2018, negative  Bone density: Never   Allergies  Allergen Reactions  . Demerol [Meperidine] Nausea And Vomiting  . Sulfa Antibiotics Nausea And Vomiting  . Tape Other (See Comments)    Takes off skin    Current Outpatient Medications  Medication Sig Dispense Refill  . acetaminophen (TYLENOL) 500 MG tablet Take 1 tablet (500 mg total) by mouth with breakfast, with lunch, and with evening meal. Take together with aleve 220 mg 30 tablet 0  . bisacodyl (DULCOLAX) 10 MG suppository Place 1 suppository (10 mg total) rectally as needed for moderate constipation. 12 suppository 0  . capecitabine (XELODA) 500 MG tablet Take 3 tablets (1,500 mg total) by mouth 2 (two) times daily after a meal. Tak for 14 days, then do not take for 7 days, then repeat 84 tablet 6  . [employer] MAGNESIUM CITRATE 1.745 GM/30ML SOLN Take 296 mLs by mouth once.    . gabapentin (NEURONTIN) 300 MG capsule Take one  tablet po day one, take one tablet po twice daily on day two, then if tolerated continue taking one tablet three times per day 90 capsule 1  . lisinopril (ZESTRIL) 10 MG tablet Take 1 tablet (10 mg total) by mouth daily. 30 tablet 11  . methadone (DOLOPHINE) 5 MG tablet Take 1 tablet (5 mg total) by mouth every 8 (eight) hours. 30 tablet 0  . morphine (MSIR) 15 MG tablet Take 1 tablet (15 mg total) by mouth every 4 (four) hours as needed for severe pain. 180 tablet 0  . omeprazole (PRILOSEC) 40 MG capsule Take 1 capsule (40 mg total) by mouth daily. 90 capsule 3  . ondansetron (ZOFRAN ODT) 4 MG disintegrating tablet Take 1 tablet (4 mg total) by mouth every 8 (eight) hours as needed for nausea or vomiting. 20 tablet 0  . polyethylene glycol (MIRALAX / GLYCOLAX) 17 g packet Take  17 g by mouth daily.    Marland Kitchen senna-docusate (SENOKOT-S) 8.6-50 MG tablet Take 2 tablets by mouth 2 (two) times daily. 120 tablet 0  . sodium phosphate (FLEET) 7-19 GM/118ML ENEM Place 133 mLs (1 enema total) rectally daily as needed for severe constipation. 399 mL 0   No current facility-administered medications for this visit.    OBJECTIVE: White woman who appears stated age  64:   11/01/20 1225  BP: 121/77  Pulse: 71  Resp: 18  Temp: 97.8 F (36.6 C)  SpO2: 98%   Wt Readings from Last 3 Encounters:  11/01/20 145 lb 6.4 oz (66 kg)  10/17/20 145 lb 6.4 oz (66 kg)  10/11/20 146 lb 8 oz (66.5 kg)   Body mass index is 24.2 kg/m.    ECOG FS:2 - Symptomatic, <50% confined to bed  Sclerae unicteric, EOMs intact Wearing a mask No cervical or supraclavicular adenopathy Lungs no rales or rhonchi Heart regular rate and rhythm Abd soft, nontender, positive bowel sounds MSK no focal spinal tenderness, no upper extremity lymphedema Neuro: nonfocal, well oriented, appropriate affect Breasts: The right breast is status post lumpectomy and radiation.  The cosmetic result is good.  I do not palpate any evidence of  local recurrence.  The left breast is benign.  Both axillae are benign.   LAB RESULTS:  CMP     Component Value Date/Time   NA 136 11/01/2020 1216   NA 140 09/02/2018 1106   K 4.5 11/01/2020 1216   CL 104 11/01/2020 1216   CO2 26 11/01/2020 1216   GLUCOSE 113 (H) 11/01/2020 1216   BUN 12 11/01/2020 1216   BUN 22 09/02/2018 1106   CREATININE 0.69 11/01/2020 1216   CALCIUM 8.6 (L) 11/01/2020 1216   PROT 7.1 11/01/2020 1216   ALBUMIN 3.7 11/01/2020 1216   AST 31 11/01/2020 1216   ALT 30 11/01/2020 1216   ALKPHOS 194 (H) 11/01/2020 1216   BILITOT 0.6 11/01/2020 1216   GFRNONAA >60 11/01/2020 1216   GFRAA >60 09/20/2020 1444    No results found for: TOTALPROTELP, ALBUMINELP, A1GS, A2GS, BETS, BETA2SER, GAMS, MSPIKE, SPEI  Lab Results  Component Value Date   WBC 5.5 11/01/2020   NEUTROABS 4.3 11/01/2020   HGB 14.7 11/01/2020   HCT 44.3 11/01/2020   MCV 91.5 11/01/2020   PLT 172 11/01/2020    No results found for: LABCA2  No components found for: PZWCHE527  No results for input(s): INR in the last 168 hours.  No results found for: LABCA2  No results found for: CAN199  No results found for: POE423  No results found for: NTI144  Lab Results  Component Value Date   CA2729 425.1 (H) 10/11/2020    No components found for: HGQUANT  Lab Results  Component Value Date   CEA1 3,154.00 (H) 10/11/2020   /  CEA ([facility])  Date Value Ref Range Status  10/11/2020 1,184.52 (H) 0.00 - 5.00 ng/mL Final    Comment:    (NOTE) This test was performed using Architect's Chemiluminescent Microparticle Immunoassay. Values obtained from different assay methods cannot be used interchangeably. Please note that 5-10% of patients who smoke may see CEA levels up to 6.9 ng/mL. Performed at [facility], [place] [address], [place], [place] [address]      No results found for: AFPTUMOR  No results found for: [place]  No results found for:  KPAFRELGTCHN, LAMBDASER, KAPLAMBRATIO (kappa/lambda light chains)  No results found for: HGBA, HGBA2QUANT,  HGBFQUANT, HGBSQUAN (Hemoglobinopathy evaluation)   No results found for: LDH  No results found for: IRON, TIBC, IRONPCTSAT (Iron and TIBC)  No results found for: FERRITIN  Urinalysis    Component Value Date/Time   COLORURINE STRAW (A) 04/27/2020 0928   APPEARANCEUR CLEAR 04/27/2020 0928   LABSPEC 1.015 05/30/2020 1402   PHURINE 6.0 05/30/2020 1402   GLUCOSEU NEGATIVE 05/30/2020 1402   HGBUR NEGATIVE 05/30/2020 1402   BILIRUBINUR NEGATIVE 05/30/2020 1402   KETONESUR NEGATIVE 05/30/2020 1402   PROTEINUR NEGATIVE 05/30/2020 1402   UROBILINOGEN 0.2 05/30/2020 1402   NITRITE NEGATIVE 05/30/2020 1402   LEUKOCYTESUR NEGATIVE 05/30/2020 1402    STUDIES: No results found.   ELIGIBLE FOR AVAILABLE RESEARCH PROTOCOL: no  ASSESSMENT: 64 y.o. [place] woman   (1) status post right lumpectomy and axillary lymph node dissection in 2007 for a stage III invasive breast cancer, additional pathology details to be reviewed when available  (a) a total of 13 axillary lymph nodes were removed, 11 positive  (b) adjuvant chemotherapy consisted of docetaxel, doxorubicin and cyclophosphamide x6  (c) the patient received adjuvant radiation  (d) received letrozole briefly but did not tolerate it.  METASTATIC DISEASE (2) CT scans of the chest abdomen and pelvis 06/15/2020 shows widespread bony metastatic disease, no definitive visceral disease  (a) bone marrow biopsy  06/20/2020 nondiagnostic  (b) F 18 estradiol PET scan (cerianna) 07/31/2020 shows no estrogen uptake in the known bone lesions or elsewhere  (3) Capecitabine to start on 08/24/2020 at 1500 mg twice daily, on radiation days only  (a) Xgeva started on 07/13/2020 given every 4 weeks  (4) palliative radiation   PLAN: Catherine Tyler is tolerating her radiation treatments well and her final treatment will be 11/10/2020.  So far she has  not had any significant relief from the radiation treatments.  She has also not had any significant side effects from the sensitizing capecitabine doses.  Her pain is not well controlled.  She understands the goal of pain management is as normal function as possible.  The goal is not to have no pain.  Right now she cannot do normal activities well.  She cannot cook shop or take a walk because of the significant pain she is on.  Unfortunately she was not able to tolerate MS Contin.  We are going to give methadone a try.  The standard start dose is 5 mg 3 times a day.  She will start that tomorrow morning 11/02/2020.  She understands that methadone works both as a short acting pain relief and also creates a Depo.  That is the reason we cannot adjust the dose very quickly.  I usually wait about 2 weeks to make a dose adjustment.  Accordingly tomorrow she will not take the morphine.  If she takes the methadone say at 8 in the morning and she is having significant pain around 10 or 11 she could take an extra morphine at that time but then she does need to take methadone again in the middle of the day and in the evening.  Hopefully by the time she sees [place] again next week she will be on this medication 3 times a day and we will be able to assess whether she is using less of the morphine for breakthrough pain.  If this does not work we can consider a fentanyl patch.  Once she completes her radiation treatments we will obtain a new baseline CT of the chest, increased the capecitabine dose to 1500 mg twice daily  14 days on and 7 days off, and follow measurable disease  Total encounter time 35 minutes.Sarajane Jews C. Kimi Kroft, MD 11/01/20 1:07 PM Medical Oncology and Hematology [facility] [place], [place] [address] Tel. [phone]    Fax. [phone]   I, Wilburn Mylar, am acting as scribe for Dr. Virgie Tyler. [address]ulma Court.  I, Lurline Del MD, have reviewed the above  documentation for accuracy and completeness, and I agree with the above.    *Total Encounter Time as defined by the Centers for Medicare and Medicaid Services includes, in addition to the face-to-face time of a patient visit (documented in the note above) non-face-to-face time: obtaining and reviewing outside history, ordering and reviewing medications, tests or procedures, care coordination (communications with other health care professionals or caregivers) and documentation in the medical record.

## 2020-11-01 NOTE — Progress Notes (Signed)
No xgeva today per MD magrinat. Pt will get injection next week

## 2020-11-02 ENCOUNTER — Ambulatory Visit
Admission: RE | Admit: 2020-11-02 | Discharge: 2020-11-02 | Disposition: A | Discharge status: Home / Self Care | Payer: Medicaid Other | Source: Ambulatory Visit | Attending: Radiation Oncology | Admitting: Radiation Oncology

## 2020-11-02 ENCOUNTER — Other Ambulatory Visit: Payer: Self-pay

## 2020-11-02 DIAGNOSIS — Z510 Encounter for antineoplastic radiation therapy: Secondary | ICD-10-CM | POA: Diagnosis not present

## 2020-11-03 ENCOUNTER — Ambulatory Visit
Admission: RE | Admit: 2020-11-03 | Discharge: 2020-11-03 | Disposition: A | Discharge status: Home / Self Care | Payer: Medicaid Other | Source: Ambulatory Visit | Attending: Radiation Oncology | Admitting: Radiation Oncology

## 2020-11-03 DIAGNOSIS — Z510 Encounter for antineoplastic radiation therapy: Secondary | ICD-10-CM | POA: Diagnosis not present

## 2020-11-06 ENCOUNTER — Ambulatory Visit
Admission: RE | Admit: 2020-11-06 | Discharge: 2020-11-06 | Disposition: A | Discharge status: Home / Self Care | Payer: Medicaid Other | Source: Ambulatory Visit | Attending: Radiation Oncology | Admitting: Radiation Oncology

## 2020-11-06 DIAGNOSIS — Z510 Encounter for antineoplastic radiation therapy: Secondary | ICD-10-CM | POA: Diagnosis not present

## 2020-11-07 ENCOUNTER — Ambulatory Visit
Admission: RE | Admit: 2020-11-07 | Discharge: 2020-11-07 | Disposition: A | Discharge status: Home / Self Care | Payer: Medicaid Other | Source: Ambulatory Visit | Attending: Radiation Oncology | Admitting: Radiation Oncology

## 2020-11-07 DIAGNOSIS — Z510 Encounter for antineoplastic radiation therapy: Secondary | ICD-10-CM | POA: Diagnosis not present

## 2020-11-08 ENCOUNTER — Ambulatory Visit
Admission: RE | Admit: 2020-11-08 | Discharge: 2020-11-08 | Disposition: A | Discharge status: Home / Self Care | Payer: Medicaid Other | Source: Ambulatory Visit | Attending: Radiation Oncology | Admitting: Radiation Oncology

## 2020-11-08 DIAGNOSIS — Z510 Encounter for antineoplastic radiation therapy: Secondary | ICD-10-CM | POA: Diagnosis not present

## 2020-11-09 ENCOUNTER — Ambulatory Visit
Admission: RE | Admit: 2020-11-09 | Discharge: 2020-11-09 | Disposition: A | Discharge status: Home / Self Care | Payer: Medicaid Other | Source: Ambulatory Visit | Attending: Radiation Oncology | Admitting: Radiation Oncology

## 2020-11-09 ENCOUNTER — Inpatient Hospital Stay (HOSPITAL_BASED_OUTPATIENT_CLINIC_OR_DEPARTMENT_OTHER): Payer: Medicaid Other | Admitting: Adult Health

## 2020-11-09 ENCOUNTER — Other Ambulatory Visit: Payer: Self-pay

## 2020-11-09 ENCOUNTER — Encounter: Payer: Self-pay | Admitting: Adult Health

## 2020-11-09 ENCOUNTER — Inpatient Hospital Stay: Payer: Medicaid Other

## 2020-11-09 ENCOUNTER — Telehealth: Payer: Self-pay | Admitting: Adult Health

## 2020-11-09 VITALS — BP 103/72 | HR 76 | Temp 97.8°F | Resp 18 | Ht 65.0 in | Wt 142.8 lb

## 2020-11-09 DIAGNOSIS — Z170 Estrogen receptor positive status [ER+]: Secondary | ICD-10-CM | POA: Diagnosis not present

## 2020-11-09 DIAGNOSIS — C50811 Malignant neoplasm of overlapping sites of right female breast: Secondary | ICD-10-CM | POA: Diagnosis not present

## 2020-11-09 DIAGNOSIS — Z510 Encounter for antineoplastic radiation therapy: Secondary | ICD-10-CM | POA: Diagnosis not present

## 2020-11-09 DIAGNOSIS — C7951 Secondary malignant neoplasm of bone: Secondary | ICD-10-CM | POA: Diagnosis not present

## 2020-11-09 NOTE — Progress Notes (Addendum)
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   She is in her off week of capecitabine, and will restart next week.  Next week she will take 3 tablets twice a day two weeks on and one week off since she has finished her radiation therapy.  She is having continued difficulty with pain.  She is not experiencing much relief with Morphine and she is afraid to try the Methadone.  She is taking Gabapentin as well.  She is not functioning as well.     REVIEW OF SYSTEMS: Catori is struggling with pain.  She is not constipated and denies any fever, chills, bowel/bladder changes, headaches, or vision issues.  She notes a mild occasional cough.  She is feeling well otherwise and a detailed ROS was otherwise non contributory.     COVID 19 VACCINATION STATUS:    HISTORY OF CURRENT ILLNESS: From the original intake note:  "[name]" has a history of right breast cancer, diagnosed in 2007.  From her report, this appears to have been about 3 cm and to have involved 11 of the 13 right axillary lymph nodes involved.  She was treated at [facility] by Dr.  She was treated under Dr Epimenio Foot in [facility] with lumpectomy, chemotherapy (TAC x6) , and radiation therapy.  She then tried Femara which she did not tolerate and no further antiestrogens were prescribed.  She presented to the ED yesterday, 06/15/2020, with diffuse back, chest, and abdominal pain, as well as  unintentional weight loss. Chest x-ray performed at that time showed: concern for bone metastasis with pathologic fracture of left 5th rib.  She proceeded to chest, abdomen, pelvis CT that day, which showed: multifocal predominantly blastic-appearing osseous metastatic disease involving the thoracic and lumbar spine, multiple ribs most prominently at the left fifth through seventh ribs, and the right sacral ala; nonspecific mixed solid nodules and ill-defined ground-glass opacities in the left upper lobe measuring up to 7 mm and 6 mm in size; mild distal small bowel fecalization without evidence of obstruction.  Of note, her most recent mammogram on file is a bilateral diagnostic scan from 08/11/2018. At that time, she presented with a burning sensation to the inferior right breast. Mammogram showed: breast density category B; no mammographic evidence of malignancy in the bilateral breasts.   The patient's subsequent history is as detailed below.   PAST MEDICAL HISTORY: Past Medical History:  Diagnosis Date  . Cancer ([place])   . Personal history of chemotherapy   . Personal history of radiation therapy     PAST SURGICAL HISTORY: Past Surgical History:  Procedure Laterality Date  . BREAST LUMPECTOMY    . BREAST SURGERY      FAMILY HISTORY: Family History  Problem Relation Age of Onset  . Hyperlipidemia Mother   . Heart failure Mother   The patient's father died at age 38 from a myocardial infarction.  As far as the patient knows there is no history of cancer on that side of the family.  The patient's mother died at age 57 from renal failure.  The patient is not aware of any cancer on that side of the family.  The patient herself has 5 sisters, no brothers.  1 sister Catherine Tyler) had lung cancer.   GYNECOLOGIC HISTORY:  No LMP recorded (lmp unknown). Patient has had a hysterectomy. Menarche: 64 years old Age at first live birth: 64 years old [place] P 1 LMP status post hysterectomy at age 14  secondary to fibroids; status post remote conization for in situ cervical carcinoma BSO?    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  She lives by herself with her dog [name] who is a rescue.  The patient has been divorced 13 years as of June 2021.  Her son Catherine Tyler lives in [place] where he works in Architect.  The patient has 1 grandchild.  She attends [employer].  ADVANCED DIRECTIVES: To be discussed   HEALTH MAINTENANCE: Social History   Tobacco Use  . Smoking status: Current Some Day Smoker  . Smokeless tobacco: Never Used  Vaping Use  . Vaping Use: Never used  Substance Use Topics  . Alcohol use: Yes    Comment: on occasion  . Drug use: Never     Colonoscopy: Never  PAP: 07/2018, negative  Bone density: Never   Allergies  Allergen Reactions  . Demerol [Meperidine] Nausea And Vomiting  . Sulfa Antibiotics Nausea And Vomiting  . Tape Other (See Comments)    Takes off skin    Current Outpatient Medications  Medication Sig Dispense Refill  . acetaminophen (TYLENOL) 500 MG tablet Take 1 tablet (500 mg total) by mouth with breakfast, with lunch, and with evening meal. Take together with aleve 220 mg 30 tablet 0  . bisacodyl (DULCOLAX) 10 MG suppository Place 1 suppository (10 mg total) rectally as needed for moderate constipation. 12 suppository 0  . capecitabine (XELODA) 500 MG tablet Take 3 tablets (1,500 mg total) by mouth 2 (two) times daily after a meal. Tak for 14 days, then do not take for 7 days, then repeat 84 tablet 6  . [employer] MAGNESIUM CITRATE 1.745 GM/30ML SOLN Take 296 mLs by mouth once.    . gabapentin (NEURONTIN) 300 MG capsule Take one tablet po day one, take one tablet po twice daily on day two, then if tolerated continue taking one tablet three times per day 90 capsule 1  . lisinopril (ZESTRIL) 10 MG tablet Take 1 tablet (10 mg total) by mouth daily. 30 tablet 11  . methadone  (DOLOPHINE) 5 MG tablet Take 1 tablet (5 mg total) by mouth every 8 (eight) hours. 30 tablet 0  . morphine (MSIR) 15 MG tablet Take 1 tablet (15 mg total) by mouth every 4 (four) hours as needed for severe pain. 180 tablet 0  . omeprazole (PRILOSEC) 40 MG capsule Take 1 capsule (40 mg total) by mouth daily. 90 capsule 3  . ondansetron (ZOFRAN ODT) 4 MG disintegrating tablet Take 1 tablet (4 mg total) by mouth every 8 (eight) hours as needed for nausea or vomiting. 20 tablet 0  . polyethylene glycol (MIRALAX / GLYCOLAX) 17 g packet Take 17 g by mouth daily.    Marland Kitchen senna-docusate (SENOKOT-S) 8.6-50 MG tablet Take 2 tablets by mouth 2 (two) times daily. 120 tablet 0  . sodium phosphate (FLEET) 7-19 GM/118ML ENEM Place 133 mLs (1 enema total) rectally daily as needed for severe  constipation. 399 mL 0   No current facility-administered medications for this visit.    OBJECTIVE: White woman who appears stated age  48:   11/09/20 1401  BP: 103/72  Pulse: 76  Resp: 18  Temp: 97.8 F (36.6 C)  SpO2: 99%   Wt Readings from Last 3 Encounters:  11/09/20 142 lb 12.8 oz (64.8 kg)  11/01/20 145 lb 6.4 oz (66 kg)  10/17/20 145 lb 6.4 oz (66 kg)   Body mass index is 23.76 kg/m.    ECOG FS:2 - Symptomatic, <50% confined to bed GENERAL: Patient is a well appearing female in no acute distress, but appears uncomfortable HEENT:  Sclerae anicteric. Mask in place Neck is supple.  NODES:  No cervical, supraclavicular, or axillary lymphadenopathy palpated.  BREAST EXAM:  Deferred. LUNGS:  Clear to auscultation bilaterally.  No wheezes or rhonchi. HEART:  Regular rate and rhythm. No murmur appreciated. ABDOMEN:  Soft, nontender.  Positive, normoactive bowel sounds. No organomegaly palpated. MSK:  No focal spinal tenderness to palpation. Full range of motion bilaterally in the upper extremities. EXTREMITIES:  No peripheral edema.   SKIN:  Clear with no obvious rashes or skin changes. No nail  dyscrasia. NEURO:  Nonfocal. Well oriented.  Appropriate affect.     LAB RESULTS:  CMP     Component Value Date/Time   NA 136 11/01/2020 1216   NA 140 09/02/2018 1106   K 4.5 11/01/2020 1216   CL 104 11/01/2020 1216   CO2 26 11/01/2020 1216   GLUCOSE 113 (H) 11/01/2020 1216   BUN 12 11/01/2020 1216   BUN 22 09/02/2018 1106   CREATININE 0.69 11/01/2020 1216   CALCIUM 8.6 (L) 11/01/2020 1216   PROT 7.1 11/01/2020 1216   ALBUMIN 3.7 11/01/2020 1216   AST 31 11/01/2020 1216   ALT 30 11/01/2020 1216   ALKPHOS 194 (H) 11/01/2020 1216   BILITOT 0.6 11/01/2020 1216   GFRNONAA >60 11/01/2020 1216   GFRAA >60 09/20/2020 1444    No results found for: TOTALPROTELP, ALBUMINELP, A1GS, A2GS, BETS, BETA2SER, GAMS, MSPIKE, SPEI  Lab Results  Component Value Date   WBC 5.5 11/01/2020   NEUTROABS 4.3 11/01/2020   HGB 14.7 11/01/2020   HCT 44.3 11/01/2020   MCV 91.5 11/01/2020   PLT 172 11/01/2020    No results found for: LABCA2  No components found for: ENIDPO242  No results for input(s): INR in the last 168 hours.  No results found for: LABCA2  No results found for: CAN199  No results found for: PNT614  No results found for: ERX540  Lab Results  Component Value Date   CA2729 425.1 (H) 10/11/2020    No components found for: HGQUANT  Lab Results  Component Value Date   CEA1 0,867.61 (H) 10/11/2020   /  CEA ([facility]In [facility])  Date Value Ref Range Status  10/11/2020 1,184.52 (H) 0.00 - 5.00 ng/mL Final    Comment:    (NOTE) This test was performed using Architect's Chemiluminescent Microparticle Immunoassay. Values obtained from different assay methods cannot be used interchangeably. Please note that 5-10% of patients who smoke may see CEA levels up to 6.9 ng/mL. Performed at [facility], [place] [address], [place], [place] [address]      No results found for: AFPTUMOR  No results found for: CHROMOGRNA  No results found for:  KPAFRELGTCHN, LAMBDASER, KAPLAMBRATIO (kappa/lambda light chains)  No results found for: HGBA, HGBA2QUANT, HGBFQUANT, HGBSQUAN (Hemoglobinopathy evaluation)   No results found  for: LDH  No results found for: IRON, TIBC, IRONPCTSAT (Iron and TIBC)  No results found for: FERRITIN  Urinalysis    Component Value Date/Time   COLORURINE STRAW (A) 04/27/2020 0928   APPEARANCEUR CLEAR 04/27/2020 0928   LABSPEC 1.015 05/30/2020 1402   PHURINE 6.0 05/30/2020 1402   GLUCOSEU NEGATIVE 05/30/2020 1402   HGBUR NEGATIVE 05/30/2020 1402   BILIRUBINUR NEGATIVE 05/30/2020 1402   KETONESUR NEGATIVE 05/30/2020 1402   PROTEINUR NEGATIVE 05/30/2020 1402   UROBILINOGEN 0.2 05/30/2020 1402   NITRITE NEGATIVE 05/30/2020 1402   LEUKOCYTESUR NEGATIVE 05/30/2020 1402    STUDIES: No results found.   ELIGIBLE FOR AVAILABLE RESEARCH PROTOCOL: no  ASSESSMENT: 64 y.o. [place] woman   (1) status post right lumpectomy and axillary lymph node dissection in 2007 for a stage III invasive breast cancer, additional pathology details to be reviewed when available  (a) a total of 13 axillary lymph nodes were removed, 11 positive  (b) adjuvant chemotherapy consisted of docetaxel, doxorubicin and cyclophosphamide x6  (c) the patient received adjuvant radiation  (d) received letrozole briefly but did not tolerate it.  METASTATIC DISEASE (2) CT scans of the chest abdomen and pelvis 06/15/2020 shows widespread bony metastatic disease, no definitive visceral disease  (a) bone marrow biopsy  06/20/2020 nondiagnostic  (b) F 18 estradiol PET scan (cerianna) 07/31/2020 shows no estrogen uptake in the known bone lesions or elsewhere  (3) Capecitabine to start on 08/24/2020 at 1500 mg twice daily, on radiation days only  (a) Xgeva started on 07/13/2020 given every 4 weeks  (4) palliative radiation   PLAN: Janace Hoard is doing moderately well today.  I am concerned about her continued pain.  She met with Dr. Jana Hakim  today to discuss her pain.  We are going to do restaging studies with CT chest.  She will complete her radiation treatments, and then she will continue on her Morphine and Gabapentin.    Dr. Jana Hakim recommended that she see a pain specialist to evaluate and help her with her pain since she cannot tolerate long acting Morphine and is fearful of Methadone.  She is willing to do this.    Jerrine will not receive Xgeva today.  She did not have labs today and her most recent calcium level was slightly decreased at 8.6.  Instead she will receive Xgeva when she returns.    Dolce will see Dr. Jana Hakim on 11/22.  She will have labs prior to her f/u, they will review her scans and her pain, and she will have an injection afterwards.  She knows to call for any questions that may arise between now and her next appointment.  We are happy to see her sooner if needed.  Total encounter time 35 minutes.Wilber Bihari, NP 11/09/20 2:26 PM Medical Oncology and Hematology [facility] [place], [place] [address] Tel. [phone]    Fax. [phone]   ADDENDUM: Elishia's situation is difficult and I am hopeful the radiation she received will help the pain she has been experiencing.  We have not been able to put her on a longer acting pain program.  I think she will benefit from referral to a pain clinic for discussion of further options and to take over pain management.  She is agreeable and we have placed the referral.  I personally saw this patient and performed a substantive portion of this encounter with the listed APP documented above.   [name], MD Medical Oncology and Hematology  [facility] [place], [place] [address] Tel. [phone]    Fax. [phone]    *Total Encounter Time as defined by the Centers for Medicare and Medicaid Services includes, in addition to the face-to-face time of a patient visit (documented in the  note above) non-face-to-face time: obtaining and reviewing outside history, ordering and reviewing medications, tests or procedures, care coordination (communications with other health care professionals or caregivers) and documentation in the medical record.

## 2020-11-09 NOTE — Telephone Encounter (Signed)
Scheduled appointment per 11/11 los. Spoke to patient who is aware of appointment date and time.

## 2020-11-10 ENCOUNTER — Ambulatory Visit
Admission: RE | Admit: 2020-11-10 | Discharge: 2020-11-10 | Disposition: A | Discharge status: Home / Self Care | Payer: Medicaid Other | Source: Ambulatory Visit | Attending: Radiation Oncology | Admitting: Radiation Oncology

## 2020-11-10 ENCOUNTER — Encounter: Payer: Self-pay | Admitting: Radiation Oncology

## 2020-11-10 DIAGNOSIS — Z510 Encounter for antineoplastic radiation therapy: Secondary | ICD-10-CM | POA: Diagnosis not present

## 2020-11-11 ENCOUNTER — Encounter: Payer: Self-pay | Admitting: Adult Health

## 2020-11-13 ENCOUNTER — Other Ambulatory Visit: Payer: Self-pay

## 2020-11-13 DIAGNOSIS — C50811 Malignant neoplasm of overlapping sites of right female breast: Secondary | ICD-10-CM

## 2020-11-13 NOTE — Progress Notes (Signed)
Referra, most recent progress note, and pt's demographic information sent to [facility]. Pt understands to call [phone] to make appt if she has not heard from them. Pt verbalized thanks and understanding.

## 2020-11-20 ENCOUNTER — Ambulatory Visit: Payer: Medicaid Other | Admitting: Oncology

## 2020-11-20 ENCOUNTER — Ambulatory Visit: Payer: Medicaid Other

## 2020-11-20 ENCOUNTER — Other Ambulatory Visit: Payer: Medicaid Other

## 2020-11-21 ENCOUNTER — Encounter (HOSPITAL_COMMUNITY): Payer: Self-pay

## 2020-11-21 ENCOUNTER — Ambulatory Visit (HOSPITAL_COMMUNITY)
Admission: RE | Admit: 2020-11-21 | Discharge: 2020-11-21 | Disposition: A | Discharge status: Home / Self Care | Payer: Medicaid Other | Source: Ambulatory Visit | Attending: Adult Health | Admitting: Adult Health

## 2020-11-21 ENCOUNTER — Other Ambulatory Visit: Payer: Self-pay

## 2020-11-21 DIAGNOSIS — Z170 Estrogen receptor positive status [ER+]: Secondary | ICD-10-CM | POA: Diagnosis present

## 2020-11-21 DIAGNOSIS — C50811 Malignant neoplasm of overlapping sites of right female breast: Secondary | ICD-10-CM | POA: Diagnosis present

## 2020-11-21 DIAGNOSIS — C7951 Secondary malignant neoplasm of bone: Secondary | ICD-10-CM | POA: Diagnosis not present

## 2020-11-21 MED ORDER — IOHEXOL 300 MG/ML  SOLN
75.0000 mL | Freq: Once | INTRAMUSCULAR | Status: AC | PRN
  Administered 2020-11-21: 75 mL via INTRAVENOUS

## 2020-11-22 ENCOUNTER — Encounter: Payer: Self-pay | Admitting: Oncology

## 2020-11-24 ENCOUNTER — Other Ambulatory Visit: Payer: Self-pay | Admitting: Adult Health

## 2020-11-24 ENCOUNTER — Other Ambulatory Visit: Payer: Self-pay | Admitting: *Deleted

## 2020-11-24 DIAGNOSIS — Z170 Estrogen receptor positive status [ER+]: Secondary | ICD-10-CM

## 2020-11-24 DIAGNOSIS — C7951 Secondary malignant neoplasm of bone: Secondary | ICD-10-CM

## 2020-11-24 MED ORDER — MORPHINE SULFATE 15 MG PO TABS: 15.0000 mg | ORAL_TABLET | ORAL | 0 refills | Status: DC | PRN

## 2020-11-24 NOTE — Progress Notes (Signed)
Refilling patient Morphine taken PR.  Patient last fill 30 days ago.  Refilling #[id number].  PMP aware reviewed, refill appropriate.   Wilber Bihari, NP

## 2020-11-29 NOTE — Progress Notes (Signed)
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   She completed her course of palliative radiation therapy on 11/10/2020.  She continues on capecitabine, now 3 tablets twice a day two weeks on and one week off since completing radiation.  Since her last visit, she underwent restaging chest CT on 11/21/2020 showing: "Significant interval increase in sclerotic osseous metastatic disease "throughout included axial skeleton; unchanged small bilateral solid and ground-glass nodules, nonspecific.   REVIEW OF SYSTEMS: [place] found the radiation "rough" but she is recovering from it.  She says she is finally having less pain associated with her left rib cage lesion.  She was seen at the pain clinic under neurosurgery and was again prescribed methadone to take 5 mg every 8 hours.  She has been reluctant to start because she says she did not have anyone to stay with her but now that her sister will be staying with her as she tells me she is ready to give it a try.  She continues to struggle with constipation from her other narcotics.  She takes stool softeners 2 or 3 tablets daily and then MiraLAX and other agents irregularly.  The peeling from her palms and soles is considerably better although not completely resolved.  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  A  detailed review of systems today was otherwise stable.   COVID 19 VACCINATION STATUS: Not get vaccinated as of November 2021   HISTORY OF CURRENT ILLNESS: From the original intake note:  "[name]" has a history of right breast cancer, diagnosed in 2007.  From her report, this appears to have been about 3 cm and to have involved 11 of the 13 right axillary lymph nodes involved.  She was [facility] at [facility] by Dr.  She was treated under Dr Epimenio Foot in [facility] with lumpectomy, chemotherapy (TAC x6) , and radiation therapy.  She then tried Femara which she did not tolerate and no further antiestrogens were prescribed.  She presented to the ED yesterday, 06/15/2020, with diffuse back, chest, and abdominal pain, as well as unintentional weight loss. Chest x-ray performed at that time showed: concern for bone metastasis with pathologic fracture of left 5th rib.  She proceeded to chest, abdomen, pelvis CT that day, which showed: multifocal predominantly blastic-appearing osseous metastatic disease involving the thoracic and lumbar spine, multiple ribs most prominently at the left fifth through seventh ribs, and the right sacral ala; nonspecific mixed solid nodules and ill-defined ground-glass opacities in the left upper lobe measuring up to 7 mm and 6 mm in size; mild distal small bowel fecalization without evidence of obstruction.  Of note, her most recent mammogram on file is a bilateral diagnostic scan from 08/11/2018. At that time, she presented with a burning sensation to the inferior right breast. Mammogram showed: breast density category B; no mammographic evidence of malignancy in the bilateral breasts.   The patient's subsequent  history is as detailed below.   PAST MEDICAL HISTORY: Past Medical History:  Diagnosis Date  . Cancer ([place])   . Personal history of chemotherapy   . Personal history of radiation therapy     PAST SURGICAL HISTORY: Past Surgical History:  Procedure  Laterality Date  . BREAST LUMPECTOMY    . BREAST SURGERY      FAMILY HISTORY: Family History  Problem Relation Age of Onset  . Hyperlipidemia Mother   . Heart failure Mother   The patient's father died at age 50 from a myocardial infarction.  As far as the patient knows there is no history of cancer on that side of the family.  The patient's mother died at age 79 from renal failure.  The patient is not aware of any cancer on that side of the family.  The patient herself has 5 sisters, no brothers.  1 sister [name]) had lung cancer.   GYNECOLOGIC HISTORY:  No LMP recorded (lmp unknown). Patient has had a hysterectomy. Menarche: 64 years old Age at first live birth: 64 years old [place] P 1 LMP status post hysterectomy at age 64 secondary to fibroids; status post remote conization for in situ cervical carcinoma BSO?    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  She lives by herself with her dog [name] who is a rescue.  The patient has been divorced 13 years as of June 2021.  Her son Elianah Karis lives in [place] where he works in Architect.  The patient has 1 grandchild.  She attends [employer].  ADVANCED DIRECTIVES: To be discussed   HEALTH MAINTENANCE: Social History   Tobacco Use  . Smoking status: Current Some Day Smoker  . Smokeless tobacco: Never Used  Vaping Use  . Vaping Use: Never used  Substance Use Topics  . Alcohol use: Yes    Comment: on occasion  . Drug use: Never     Colonoscopy: Never  PAP: 07/2018, negative  Bone density: Never   Allergies  Allergen Reactions  . Demerol [Meperidine] Nausea And Vomiting  . Sulfa Antibiotics Nausea And Vomiting  . Tape Other (See Comments)    Takes off skin    Current Outpatient Medications  Medication Sig Dispense Refill  . acetaminophen (TYLENOL) 500 MG tablet Take 1 tablet (500 mg total) by mouth with breakfast, with lunch, and with evening  meal. Take together with aleve 220 mg 30 tablet 0  . bisacodyl (DULCOLAX) 10 MG suppository Place 1 suppository (10 mg total) rectally as needed for moderate constipation. 12 suppository 0  . capecitabine (XELODA) 500 MG tablet Take 2 tablets (1,000 mg total) by mouth 2 (two) times daily after a meal. 120 tablet 6  . [employer] MAGNESIUM CITRATE 1.745 GM/30ML SOLN Take 296 mLs by mouth once.    . gabapentin (NEURONTIN) 300 MG capsule Take one tablet po day one, take one tablet po twice daily on day two, then if tolerated continue taking one tablet three times per day 90 capsule 1  . lisinopril (ZESTRIL) 10 MG tablet Take 1 tablet (10 mg total) by mouth daily. 30 tablet 11  . methadone (DOLOPHINE) 5 MG tablet Take 1 tablet (5 mg total) by mouth every 8 (eight) hours. 30 tablet 0  . morphine (MSIR) 15 MG tablet Take 1 tablet (15 mg total) by mouth every 4 (four) hours as needed for severe pain. 180 tablet 0  .  omeprazole (PRILOSEC) 40 MG capsule Take 1 capsule (40 mg total) by mouth daily. 90 capsule 3  . ondansetron (ZOFRAN ODT) 4 MG disintegrating tablet Take 1 tablet (4 mg total) by mouth every 8 (eight) hours as needed for nausea or vomiting. 20 tablet 0  . polyethylene glycol (MIRALAX / GLYCOLAX) 17 g packet Take 17 g by mouth daily.    Marland Kitchen senna-docusate (SENOKOT-S) 8.6-50 MG tablet Take 2 tablets by mouth 2 (two) times daily. 120 tablet 0  . sodium phosphate (FLEET) 7-19 GM/118ML ENEM Place 133 mLs (1 enema total) rectally daily as needed for severe constipation. 399 mL 0   No current facility-administered medications for this visit.    OBJECTIVE: White woman who appears stated age  2:   11/30/20 1130  BP: 114/74  Pulse: 69  Resp: 18  Temp: (!) 97.2 F (36.2 C)  SpO2: 98%   Wt Readings from Last 3 Encounters:  11/30/20 143 lb 9.6 oz (65.1 kg)  11/09/20 142 lb 12.8 oz (64.8 kg)  11/01/20 145 lb 6.4 oz (66 kg)   Body mass index is 23.9 kg/m.    ECOG FS:2 - Symptomatic, <50%  confined to bed  Sclerae unicteric, EOMs intact Wearing a mask No cervical or supraclavicular adenopathy Lungs no rales or rhonchi Heart regular rate and rhythm Abd soft, nontender, positive bowel sounds MSK no focal spinal tenderness, no upper extremity lymphedema Neuro: nonfocal, well oriented, appropriate affect Breasts: The right breast is status post lumpectomy and radiation.  I do not see evidence of local recurrence.  The left breast is benign.  Both axillae are benign   LAB RESULTS:  CMP     Component Value Date/Time   NA 136 11/01/2020 1216   NA 140 09/02/2018 1106   K 4.5 11/01/2020 1216   CL 104 11/01/2020 1216   CO2 26 11/01/2020 1216   GLUCOSE 113 (H) 11/01/2020 1216   BUN 12 11/01/2020 1216   BUN 22 09/02/2018 1106   CREATININE 0.69 11/01/2020 1216   CALCIUM 8.6 (L) 11/01/2020 1216   PROT 7.1 11/01/2020 1216   ALBUMIN 3.7 11/01/2020 1216   AST 31 11/01/2020 1216   ALT 30 11/01/2020 1216   ALKPHOS 194 (H) 11/01/2020 1216   BILITOT 0.6 11/01/2020 1216   GFRNONAA >60 11/01/2020 1216   GFRAA >60 09/20/2020 1444    No results found for: TOTALPROTELP, ALBUMINELP, A1GS, A2GS, BETS, BETA2SER, GAMS, MSPIKE, SPEI  Lab Results  Component Value Date   WBC 5.5 11/01/2020   NEUTROABS 4.3 11/01/2020   HGB 14.7 11/01/2020   HCT 44.3 11/01/2020   MCV 91.5 11/01/2020   PLT 172 11/01/2020    No results found for: LABCA2  No components found for: HMCNOB096  No results for input(s): INR in the last 168 hours.  No results found for: LABCA2  No results found for: CAN199  No results found for: GEZ662  No results found for: HUT654  Lab Results  Component Value Date   CA2729 425.1 (H) 10/11/2020    No components found for: HGQUANT  Lab Results  Component Value Date   CEA1 6,503.54 (H) 10/11/2020   /  CEA ([facility])  Date Value Ref Range Status  10/11/2020 1,184.52 (H) 0.00 - 5.00 ng/mL Final    Comment:    (NOTE) This test was performed using  Architect's Chemiluminescent Microparticle Immunoassay. Values obtained from different assay methods cannot be used interchangeably. Please note that 5-10% of patients who smoke may see CEA levels  up to 6.9 ng/mL. Performed at [facility], [place] [address], [place], [place] [address]      No results found for: AFPTUMOR  No results found for: CHROMOGRNA  No results found for: KPAFRELGTCHN, LAMBDASER, KAPLAMBRATIO (kappa/lambda light chains)  No results found for: HGBA, HGBA2QUANT, HGBFQUANT, HGBSQUAN (Hemoglobinopathy evaluation)   No results found for: LDH  No results found for: IRON, TIBC, IRONPCTSAT (Iron and TIBC)  No results found for: FERRITIN  Urinalysis    Component Value Date/Time   COLORURINE STRAW (A) 04/27/2020 0928   APPEARANCEUR CLEAR 04/27/2020 0928   LABSPEC 1.015 05/30/2020 1402   PHURINE 6.0 05/30/2020 1402   GLUCOSEU NEGATIVE 05/30/2020 1402   HGBUR NEGATIVE 05/30/2020 1402   BILIRUBINUR NEGATIVE 05/30/2020 1402   KETONESUR NEGATIVE 05/30/2020 1402   PROTEINUR NEGATIVE 05/30/2020 1402   UROBILINOGEN 0.2 05/30/2020 1402   NITRITE NEGATIVE 05/30/2020 1402   LEUKOCYTESUR NEGATIVE 05/30/2020 1402    STUDIES: CT Chest W Contrast  Result Date: 11/22/2020 CLINICAL DATA:  Breast cancer, assess treatment response, osseous metastatic disease EXAM: CT CHEST WITH CONTRAST TECHNIQUE: Multidetector CT imaging of the chest was performed during intravenous contrast administration. CONTRAST:  87m OMNIPAQUE IOHEXOL 300 MG/ML  SOLN COMPARISON:  CT chest abdomen pelvis, 06/15/2020 FINDINGS: CT CHEST FINDINGS Cardiovascular: Aortic atherosclerosis. Normal heart size. Scattered coronary artery calcifications. No pericardial effusion. Mediastinum/Nodes: No enlarged mediastinal, hilar, or axillary lymph nodes. Thyroid gland, trachea, and esophagus demonstrate no significant findings. Lungs/Pleura: Mild paraseptal emphysema. Multiple small bilateral  solid and ground-glass nodules are unchanged compared to prior examination, for example a 4 mm solid nodule of the right apex (series 5, image 30), a 4 mm nodule of the dependent right lower lobe (series 5, image 106) and a 1.0 cm ground-glass opacity of the central left upper lobe (series 5, image 41). Mild subpleural radiation fibrosis of the anterior right upper lobe (series 5, image 70). No pleural effusion or pneumothorax. Musculoskeletal: Significant interval increase in sclerotic osseous metastatic disease involving the majority of the vertebral bodies, with new foci involving the sternal body (series 7, image 82). There are very expansile metastatic lesions of the left ribs, which are increased in size (series 2, image 98). Upper abdomen: No acute findings. Small nonobstructive superior pole calculus of the left kidney. Unchanged thickening of the left greater than right adrenal glands. IMPRESSION: 1. Significant interval increase in sclerotic osseous metastatic disease throughout the included axial skeleton. 2. Multiple small bilateral solid and ground-glass nodules are unchanged compared to prior examination. These are nonspecific and very likely incidental benign sequelae of prior infection or inflammation, although metastatic disease is not strictly excluded. Attention on follow-up. 3. Mild subpleural radiation fibrosis of the anterior right upper lobe. 4. Unchanged non nodular thickening of the adrenal glands in the included upper abdomen, most likely benign hyperplasia. Attention on follow-up. 5. Coronary artery disease. Aortic Atherosclerosis (ICD10-I70.0). Electronically Signed   By: AEddie CandleM.D.   On: 11/22/2020 09:02     ELIGIBLE FOR AVAILABLE RESEARCH PROTOCOL: no  ASSESSMENT: 64y.o. [employer] woman   (1) status post right lumpectomy and axillary lymph node dissection in 2007 for a stage III invasive breast cancer, additional pathology details to be reviewed when available  (a) a  total of 13 axillary lymph nodes were removed, 11 positive  (b) adjuvant chemotherapy consisted of docetaxel, doxorubicin and cyclophosphamide x6  (c) the patient received adjuvant radiation  (d) received letrozole briefly but did not tolerate it.  METASTATIC  DISEASE (2) CT scans of the chest abdomen and pelvis 06/15/2020 shows widespread bony metastatic disease, no definitive visceral disease  (a) bone marrow biopsy  06/20/2020 nondiagnostic  (b) F 18 estradiol PET scan (cerianna) 07/31/2020 shows no estrogen uptake in the known bone lesions or elsewhere  (3) Capecitabine started on 08/24/2020 at 1500 mg twice daily, on radiation days only  (a) Xgeva started on 07/13/2020 given every 4 weeks  (b) capecitabine resumed 11/30/2020 at metronomic doses (1 g twice daily daily, with no breaks)  (4) palliative radiation completed 11/10/2020  (5) pain management:  (a) to start methadone 5 mg every 8 hours 11/30/2020.  (b) bowel prophylaxis: MiraLAX daily, stool softeners 2 tablets twice daily   PLAN: I reviewed the CT of the chest with [name] and her sister.  The good news is that we do not see any visceral disease.  We reviewed the fact that bone lesions when treated as she is with denosumab become sclerotic and therefore look worse when they are actually healing.  We need a repeat bone scan at this point which will serve as her new baseline.  From this point if we repeat a bone scan in 6 months and with the new lesions then that would be an indication of disease progression  Basically then this is good news.  I would like for her to resume capecitabine however she cannot go back to the earlier dose because of the palmar plantar erythrodysesthesia problems that she developed fairly quickly.  Instead were going to try metronomic dose namely 1 g twice daily with no breaks.  This of course may cause the same symptoms and we may end up at a lower dose eventually.  She was supposed to receive Xgeva  today but I think with all the discussion and excitement as well as some delays she did not receive it.  We will reschedule.  She will see me the third week in December to make sure she is tolerating capecitabine well and to review results of the bone scan  Total encounter time 40 minutes.  Virgie Dad. Spero Gunnels, MD 11/30/20 1:27 PM Medical Oncology and Hematology [facility] [place], [place] [address] Tel. [phone]    Fax. [phone]   I, Wilburn Mylar, am acting as scribe for Dr. Virgie Dad. Diem Pagnotta.  I, Lurline Del MD, have reviewed the above documentation for accuracy and completeness, and I agree with the above.    *Total Encounter Time as defined by the Centers for Medicare and Medicaid Services includes, in addition to the face-to-face time of a patient visit (documented in the note above) non-face-to-face time: obtaining and reviewing outside history, ordering and reviewing medications, tests or procedures, care coordination (communications with other health care professionals or caregivers) and documentation in the medical record.

## 2020-11-30 ENCOUNTER — Other Ambulatory Visit: Payer: Self-pay

## 2020-11-30 ENCOUNTER — Inpatient Hospital Stay: Payer: Medicaid Other

## 2020-11-30 ENCOUNTER — Inpatient Hospital Stay: Payer: Medicaid Other | Attending: Oncology | Admitting: Oncology

## 2020-11-30 VITALS — BP 114/74 | HR 69 | Temp 97.2°F | Resp 18 | Ht 65.0 in | Wt 143.6 lb

## 2020-11-30 DIAGNOSIS — C7951 Secondary malignant neoplasm of bone: Secondary | ICD-10-CM | POA: Diagnosis not present

## 2020-11-30 DIAGNOSIS — C50911 Malignant neoplasm of unspecified site of right female breast: Secondary | ICD-10-CM | POA: Diagnosis present

## 2020-11-30 DIAGNOSIS — C50811 Malignant neoplasm of overlapping sites of right female breast: Secondary | ICD-10-CM

## 2020-11-30 DIAGNOSIS — G893 Neoplasm related pain (acute) (chronic): Secondary | ICD-10-CM

## 2020-11-30 DIAGNOSIS — Z170 Estrogen receptor positive status [ER+]: Secondary | ICD-10-CM | POA: Diagnosis not present

## 2020-11-30 MED ORDER — CAPECITABINE 500 MG PO TABS: 1000.0000 mg | ORAL_TABLET | Freq: Two times a day (BID) | ORAL | 6 refills | Status: DC

## 2020-11-30 NOTE — Progress Notes (Signed)
No xgeva injection today per Dr. Jana Hakim

## 2020-12-01 ENCOUNTER — Telehealth: Payer: Self-pay | Admitting: Oncology

## 2020-12-01 ENCOUNTER — Inpatient Hospital Stay: Payer: Medicaid Other

## 2020-12-01 NOTE — Telephone Encounter (Signed)
Called pt per 12/3 sch msg - left message for patient to call back to reschedule appt.

## 2020-12-04 ENCOUNTER — Telehealth: Payer: Self-pay | Admitting: Radiation Oncology

## 2020-12-04 MED FILL — CAPECITABINE 500 MG TABLET: 500 | 30 days supply | Qty: 120 | Fill #0

## 2020-12-04 NOTE — Telephone Encounter (Signed)
  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  Interval Since Last Radiation:  4 weeks    10/23/20-11/10/20: The left ribs and sacrum were treated to 37.5 Gy in 15 fractions  2007: The patient received adjuvant radiotherapy to the right breast and regional nodes with Dr. Garnetta Buddy in [place], [place]. Records pending.   Narrative:  The patient was contacted today for routine follow-up. During treatment she did very well with radiotherapy and did not have significant desquamation. She reports she feels like her pain has .  Impression/Plan: 1. 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 We discussed that we would be happy to continue to follow her as needed, but she will also continue to follow up with Dr. Jana Hakim in medical oncology.      Carola Rhine, PAC

## 2020-12-05 ENCOUNTER — Other Ambulatory Visit: Payer: Self-pay

## 2020-12-05 ENCOUNTER — Telehealth: Payer: Self-pay | Admitting: Oncology

## 2020-12-05 ENCOUNTER — Other Ambulatory Visit: Payer: Self-pay | Admitting: *Deleted

## 2020-12-05 ENCOUNTER — Inpatient Hospital Stay: Payer: Medicaid Other

## 2020-12-05 VITALS — BP 125/64 | HR 69 | Temp 96.6°F | Resp 18

## 2020-12-05 DIAGNOSIS — G893 Neoplasm related pain (acute) (chronic): Secondary | ICD-10-CM

## 2020-12-05 DIAGNOSIS — C7951 Secondary malignant neoplasm of bone: Secondary | ICD-10-CM

## 2020-12-05 DIAGNOSIS — Z170 Estrogen receptor positive status [ER+]: Secondary | ICD-10-CM

## 2020-12-05 DIAGNOSIS — C50811 Malignant neoplasm of overlapping sites of right female breast: Secondary | ICD-10-CM

## 2020-12-05 LAB — CMP (CANCER CENTER ONLY)
ALT: 32 U/L (ref 0–44)
AST: 34 U/L (ref 15–41)
Albumin: 3.5 g/dL (ref 3.5–5.0)
Alkaline Phosphatase: 135 U/L — ABNORMAL HIGH (ref 38–126)
Anion gap: 6 (ref 5–15)
BUN: 12 mg/dL (ref 8–23)
CO2: 27 mmol/L (ref 22–32)
Calcium: 8.6 mg/dL — ABNORMAL LOW (ref 8.9–10.3)
Chloride: 107 mmol/L (ref 98–111)
Creatinine: 0.64 mg/dL (ref 0.44–1.00)
GFR, Estimated: >60 mL/min (ref >60)
Glucose, Bld: 90 mg/dL (ref 70–99)
Potassium: 4.8 mmol/L (ref 3.5–5.1)
Sodium: 140 mmol/L (ref 135–145)
Total Bilirubin: 0.5 mg/dL (ref 0.3–1.2)
Total Protein: 6.8 g/dL (ref 6.5–8.1)

## 2020-12-05 LAB — CBC WITH DIFFERENTIAL (CANCER CENTER ONLY)
Abs Immature Granulocytes: 0.01 10*3/uL (ref 0.00–0.07)
Basophils Absolute: 0 10*3/uL (ref 0.0–0.1)
Basophils Relative: 1 %
Eosinophils Absolute: 0.1 10*3/uL (ref 0.0–0.5)
Eosinophils Relative: 3 %
HCT: 39.7 % (ref 36.0–46.0)
Hemoglobin: 13.2 g/dL (ref 12.0–15.0)
Immature Granulocytes: 0 %
Lymphocytes Relative: 25 %
Lymphs Abs: 0.9 10*3/uL (ref 0.7–4.0)
MCH: 30.5 pg (ref 26.0–34.0)
MCHC: 33.2 g/dL (ref 30.0–36.0)
MCV: 91.7 fL (ref 80.0–100.0)
Monocytes Absolute: 0.4 10*3/uL (ref 0.1–1.0)
Monocytes Relative: 11 %
Neutro Abs: 2.1 10*3/uL (ref 1.7–7.7)
Neutrophils Relative %: 60 %
Platelet Count: 156 10*3/uL (ref 150–400)
RBC: 4.33 MIL/uL (ref 3.87–5.11)
RDW: 15.1 % (ref 11.5–15.5)
WBC Count: 3.5 10*3/uL — ABNORMAL LOW (ref 4.0–10.5)
nRBC: 0 % (ref 0.0–0.2)

## 2020-12-05 LAB — CEA (IN HOUSE-CHCC): CEA (CHCC-In House): 1117.63 ng/mL — ABNORMAL HIGH (ref 0.00–5.00)

## 2020-12-05 MED ORDER — DENOSUMAB 120 MG/1.7ML ~~LOC~~ SOLN
SUBCUTANEOUS | Status: AC
  Filled 2020-12-05: qty 1.7

## 2020-12-05 MED ORDER — DENOSUMAB 120 MG/1.7ML ~~LOC~~ SOLN
120.0000 mg | Freq: Once | SUBCUTANEOUS | Status: AC
  Administered 2020-12-05: 120 mg via SUBCUTANEOUS

## 2020-12-05 NOTE — Telephone Encounter (Signed)
Scheduled appts per 12/2 los. Pt confirmed appt date and time.

## 2020-12-06 LAB — CANCER ANTIGEN 27.29: CA 27.29: 310.3 U/mL — ABNORMAL HIGH (ref 0.0–38.6)

## 2020-12-11 ENCOUNTER — Telehealth: Payer: Self-pay | Admitting: Oncology

## 2020-12-11 NOTE — Telephone Encounter (Signed)
Rescheduled appts per 12/13 incoming call. Pt request to reschedule appts. Pt confirmed new appt date/time.

## 2020-12-12 ENCOUNTER — Encounter (HOSPITAL_COMMUNITY): Admission: RE | Admit: 2020-12-12 | Payer: Medicaid Other | Source: Ambulatory Visit

## 2020-12-12 ENCOUNTER — Encounter (HOSPITAL_COMMUNITY): Payer: Medicaid Other

## 2020-12-13 ENCOUNTER — Other Ambulatory Visit: Payer: Medicaid Other

## 2020-12-13 ENCOUNTER — Ambulatory Visit: Payer: Medicaid Other | Admitting: Oncology

## 2020-12-19 ENCOUNTER — Other Ambulatory Visit: Payer: Self-pay

## 2020-12-19 ENCOUNTER — Ambulatory Visit (HOSPITAL_COMMUNITY): Payer: Medicaid Other

## 2020-12-19 ENCOUNTER — Encounter (HOSPITAL_COMMUNITY)
Admission: RE | Admit: 2020-12-19 | Discharge: 2020-12-19 | Disposition: A | Discharge status: Home / Self Care | Payer: Medicaid Other | Source: Ambulatory Visit | Attending: Oncology | Admitting: Oncology

## 2020-12-19 DIAGNOSIS — C7951 Secondary malignant neoplasm of bone: Secondary | ICD-10-CM | POA: Diagnosis present

## 2020-12-19 DIAGNOSIS — C50811 Malignant neoplasm of overlapping sites of right female breast: Secondary | ICD-10-CM | POA: Insufficient documentation

## 2020-12-19 DIAGNOSIS — G893 Neoplasm related pain (acute) (chronic): Secondary | ICD-10-CM | POA: Diagnosis present

## 2020-12-19 DIAGNOSIS — Z170 Estrogen receptor positive status [ER+]: Secondary | ICD-10-CM | POA: Diagnosis present

## 2020-12-19 MED ORDER — TECHNETIUM TC 99M MEDRONATE IV KIT
19.8000 | PACK | Freq: Once | INTRAVENOUS | Status: AC
  Administered 2020-12-19: 11:00:00 19.8 via INTRAVENOUS

## 2020-12-19 NOTE — Progress Notes (Signed)
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   She stopped her capecitabine about 2 weeks ago.  We had dropped her dose from 3 tablets twice daily 14 days on/7 days off, to 2 tablets twice daily taken continuously.  Unfortunately this did cause palmar plantar erythrodysesthesia again.  Also [name] complains of other problems including worse pain and particularly her left leg goal to get out of bed because the leg was weak.  Nausea, and sleeping a lot.  It is unclear how much all of that was due to the capecitabine but the patient feels most or all of it was.  She saw Dr. Shearon Stalls who recommended that [name] go back and give methadone a try but she has not done it yet.  She is just "a scarady catt" and is afraid that she may die if she overdoses.  Since her last visit, she underwent bone scan yesterday, 12/19/2020, showing multiple areas of uptake.  This will be our baseline study.  We are following her alkaline phosphatase and tumor marker.  Both are trending very favorably Results for KIMBERLYANN, HOLLAR (MRN [id number]) as of 12/20/2020 16:03  Ref. Range 06/20/2020 08:22 07/13/2020 12:50 08/17/2020 11:03 09/20/2020 14:44 10/11/2020 14:39 11/01/2020 12:16 12/05/2020 09:54  Alkaline Phosphatase Latest Ref Range: 38 - 126 U/L 368 (H) 335 (H) 223 (H) 223 (H) 182 (H) 194 (H) 135 (H)  Results for LILIYANA, THOBE (MRN [id number]) as of 12/20/2020 16:03  Ref. Range 06/20/2020 08:22 08/17/2020 11:03 09/20/2020 14:44 10/11/2020 14:39 12/05/2020 09:54  CA 27.29 Latest Ref Range: 0.0 - 38.6 U/mL 400.7 (H) 484.8 (H) 435.2 (H) 425.1 (H) 310.3 (H)    REVIEW OF SYSTEMS: Dianey was lying on a tennis ball under her left hip to see if that helps her pain.  I do think that possibly that is what caused the worsening pain and partial paresis on the left leg.  The left leg still hurts but it is no longer so weak.  For pain currently [name] continues to take short acting morphine on a very irregular schedule, sometimes up to 6 or 7 times a day.  She says she is doing better on her bowel movements.  She does not follow a regular bowel prophylaxis but manages to have a bowel movement every 2 days or so she says most of which are not too hard she says.   COVID 19 VACCINATION STATUS: Not get vaccinated as of November 2021   HISTORY OF CURRENT ILLNESS: From the original intake note:  "[name]" has a history of right breast cancer, diagnosed in 2007.  From her report, this appears to have been about 3 cm and to have involved 11 of the 13 right axillary lymph nodes involved.  She was treated at [facility] by Dr.  She was treated under Dr Epimenio Foot in [facility] with lumpectomy, chemotherapy (TAC x6) , and radiation therapy.  She then tried Femara which she did not  tolerate and no further antiestrogens were prescribed.  She presented to the ED yesterday, 06/15/2020, with diffuse back, chest, and abdominal pain, as well as unintentional weight loss. Chest x-ray performed at that time showed: concern for bone metastasis with pathologic fracture of left 5th rib.  She proceeded to chest, abdomen, pelvis CT that day, which showed: multifocal predominantly blastic-appearing osseous metastatic disease involving the thoracic and lumbar spine, multiple ribs most prominently at the left fifth through seventh ribs, and the right  sacral ala; nonspecific mixed solid nodules and ill-defined ground-glass opacities in the left upper lobe measuring up to 7 mm and 6 mm in size; mild distal small bowel fecalization without evidence of obstruction.  Of note, her most recent mammogram on file is a bilateral diagnostic scan from 08/11/2018. At that time, she presented with a burning sensation to the inferior right breast. Mammogram showed: breast density category B; no mammographic evidence of malignancy in the bilateral breasts.   The patient's subsequent history is as detailed below.   PAST MEDICAL HISTORY: Past Medical History:  Diagnosis Date  . Cancer ([place])   . Personal history of chemotherapy   . Personal history of radiation therapy     PAST SURGICAL HISTORY: Past Surgical History:  Procedure Laterality Date  . BREAST LUMPECTOMY    . BREAST SURGERY      FAMILY HISTORY: Family History  Problem Relation Age of Onset  . Hyperlipidemia Mother   . Heart failure Mother   The patient's father died at age 4 from a myocardial infarction.  As far as the patient knows there is no history of cancer on that side of the family.  The patient's mother died at age 35 from renal failure.  The patient is not aware of any cancer on that side of the family.  The patient herself has 5 sisters, no brothers.  1 sister [name]) had lung cancer.   GYNECOLOGIC HISTORY:  No LMP recorded (lmp unknown). Patient has had a hysterectomy. Menarche: 64 years old Age at first live birth: 64 years old Berry P 1 LMP status post hysterectomy at age 28 secondary to fibroids; status post remote conization for in situ cervical carcinoma BSO?    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  She lives by herself with her dog [name] who is a rescue.  The patient has been divorced 13 years as of June 2021.  Her son [name] lives in [place] where he works in Architect.  The patient has  1 grandchild.  She attends [employer].   ADVANCED DIRECTIVES: To be discussed   HEALTH MAINTENANCE: Social History   Tobacco Use  . Smoking status: Current Some Day Smoker  . Smokeless tobacco: Never Used  Vaping Use  . Vaping Use: Never used  Substance Use Topics  . Alcohol use: Yes    Comment: on occasion  . Drug use: Never     Colonoscopy: Never  PAP: 07/2018, negative  Bone density: Never   Allergies  Allergen Reactions  . Demerol [Meperidine] Nausea And Vomiting  . Sulfa Antibiotics Nausea And Vomiting  . Tape Other (See Comments)    Takes off skin    Current Outpatient Medications  Medication Sig Dispense Refill  . acetaminophen (TYLENOL) 500 MG tablet Take 1 tablet (500 mg total) by mouth with breakfast, with lunch, and with evening meal. Take together with aleve 220 mg 30 tablet 0  .  bisacodyl (DULCOLAX) 10 MG suppository Place 1 suppository (10 mg total) rectally as needed for moderate constipation. 12 suppository 0  . capecitabine (XELODA) 500 MG tablet Take 2 tablets (1,000 mg total) by mouth 2 (two) times daily after a meal. 120 tablet 6  . [employer]S MAGNESIUM CITRATE 1.745 GM/30ML SOLN Take 296 mLs by mouth once.    . gabapentin (NEURONTIN) 300 MG capsule Take one tablet po day one, take one tablet po twice daily on day two, then if tolerated continue taking one tablet three times per day 90 capsule 1  . lisinopril (ZESTRIL) 10 MG tablet Take 1 tablet (10 mg total) by mouth daily. 30 tablet 11  . methadone (DOLOPHINE) 5 MG tablet Take 1 tablet (5 mg total) by mouth every 8 (eight) hours. 30 tablet 0  . morphine (MSIR) 15 MG tablet Take 1 tablet (15 mg total) by mouth every 4 (four) hours as needed for severe pain. 180 tablet 0  . omeprazole (PRILOSEC) 40 MG capsule Take 1 capsule (40 mg total) by mouth daily. 90 capsule 3  . ondansetron (ZOFRAN ODT) 4 MG disintegrating tablet Take 1 tablet (4 mg total) by mouth every 8 (eight) hours as needed for nausea or  vomiting. 20 tablet 0  . polyethylene glycol (MIRALAX / GLYCOLAX) 17 g packet Take 17 g by mouth daily.    Marland Kitchen senna-docusate (SENOKOT-S) 8.6-50 MG tablet Take 2 tablets by mouth 2 (two) times daily. 120 tablet 0  . sodium phosphate (FLEET) 7-19 GM/118ML ENEM Place 133 mLs (1 enema total) rectally daily as needed for severe constipation. 399 mL 0   No current facility-administered medications for this visit.    OBJECTIVE: White woman who appears stated age  33:   12/20/20 1515  BP: (!) 148/68  Pulse: 84  Resp: 17  Temp: (!) 96.4 F (35.8 C)  SpO2: 97%   Wt Readings from Last 3 Encounters:  12/20/20 143 lb (64.9 kg)  11/30/20 143 lb 9.6 oz (65.1 kg)  11/09/20 142 lb 12.8 oz (64.8 kg)   Body mass index is 23.8 kg/m.    ECOG FS:2 - Symptomatic, <50% confined to bed  Sclerae unicteric, EOMs intact Wearing a mask No cervical or supraclavicular adenopathy Lungs no rales or rhonchi Heart regular rate and rhythm Abd soft, nontender, positive bowel sounds MSK no focal spinal tenderness, no upper extremity lymphedema Neuro: nonfocal, well oriented, appropriate affect Breasts: Deferred   LAB RESULTS:  CMP     Component Value Date/Time   NA 140 12/05/2020 0954   NA 140 09/02/2018 1106   K 4.8 12/05/2020 0954   CL 107 12/05/2020 0954   CO2 27 12/05/2020 0954   GLUCOSE 90 12/05/2020 0954   BUN 12 12/05/2020 0954   BUN 22 09/02/2018 1106   CREATININE 0.64 12/05/2020 0954   CALCIUM 8.6 (L) 12/05/2020 0954   PROT 6.8 12/05/2020 0954   ALBUMIN 3.5 12/05/2020 0954   AST 34 12/05/2020 0954   ALT 32 12/05/2020 0954   ALKPHOS 135 (H) 12/05/2020 0954   BILITOT 0.5 12/05/2020 0954   GFRNONAA >60 12/05/2020 0954   GFRAA >60 09/20/2020 1444    No results found for: Ronnald Ramp, A1GS, A2GS, BETS, BETA2SER, GAMS, MSPIKE, SPEI  Lab Results  Component Value Date   WBC 5.1 12/20/2020   NEUTROABS 3.0 12/20/2020   HGB 12.2 12/20/2020   HCT 36.4 12/20/2020   MCV  93.3 12/20/2020   PLT 179 12/20/2020    No results found for:  LABCA2  No components found for: [id number]  No results for input(s): INR in the last 168 hours.  No results found for: LABCA2  No results found for: CAN199  No results found for: XHB716  No results found for: RCV893  Lab Results  Component Value Date   CA2729 310.3 (H) 12/05/2020    No components found for: HGQUANT  Lab Results  Component Value Date   CEA1 1,117.63 (H) 12/05/2020   /  CEA ([facility])  Date Value Ref Range Status  12/05/2020 1,117.63 (H) 0.00 - 5.00 ng/mL Final    Comment:    (NOTE) This test was performed using Architect's Chemiluminescent Microparticle Immunoassay. Values obtained from different assay methods cannot be used interchangeably. Please note that 5-10% of patients who smoke may see CEA levels up to 6.9 ng/mL. Performed at [facility], [employer] [employer] [address], [place], [place] [address]      No results found for: AFPTUMOR  No results found for: CHROMOGRNA  No results found for: KPAFRELGTCHN, LAMBDASER, KAPLAMBRATIO (kappa/lambda light chains)  No results found for: HGBA, HGBA2QUANT, HGBFQUANT, HGBSQUAN (Hemoglobinopathy evaluation)   No results found for: LDH  No results found for: IRON, TIBC, IRONPCTSAT (Iron and TIBC)  No results found for: FERRITIN  Urinalysis    Component Value Date/Time   COLORURINE STRAW (A) 04/27/2020 0928   APPEARANCEUR CLEAR 04/27/2020 0928   LABSPEC 1.015 05/30/2020 1402   PHURINE 6.0 05/30/2020 1402   GLUCOSEU NEGATIVE 05/30/2020 1402   HGBUR NEGATIVE 05/30/2020 1402   BILIRUBINUR NEGATIVE 05/30/2020 1402   KETONESUR NEGATIVE 05/30/2020 1402   PROTEINUR NEGATIVE 05/30/2020 1402   UROBILINOGEN 0.2 05/30/2020 1402   NITRITE NEGATIVE 05/30/2020 1402   LEUKOCYTESUR NEGATIVE 05/30/2020 1402    STUDIES: CT Chest W Contrast  Result Date: 11/22/2020 CLINICAL DATA:  Breast cancer, assess treatment response,  osseous metastatic disease EXAM: CT CHEST WITH CONTRAST TECHNIQUE: Multidetector CT imaging of the chest was performed during intravenous contrast administration. CONTRAST:  85m OMNIPAQUE IOHEXOL 300 MG/ML  SOLN COMPARISON:  CT chest abdomen pelvis, 06/15/2020 FINDINGS: CT CHEST FINDINGS Cardiovascular: Aortic atherosclerosis. Normal heart size. Scattered coronary artery calcifications. No pericardial effusion. Mediastinum/Nodes: No enlarged mediastinal, hilar, or axillary lymph nodes. Thyroid gland, trachea, and esophagus demonstrate no significant findings. Lungs/Pleura: Mild paraseptal emphysema. Multiple small bilateral solid and ground-glass nodules are unchanged compared to prior examination, for example a 4 mm solid nodule of the right apex (series 5, image 30), a 4 mm nodule of the dependent right lower lobe (series 5, image 106) and a 1.0 cm ground-glass opacity of the central left upper lobe (series 5, image 41). Mild subpleural radiation fibrosis of the anterior right upper lobe (series 5, image 70). No pleural effusion or pneumothorax. Musculoskeletal: Significant interval increase in sclerotic osseous metastatic disease involving the majority of the vertebral bodies, with new foci involving the sternal body (series 7, image 82). There are very expansile metastatic lesions of the left ribs, which are increased in size (series 2, image 98). Upper abdomen: No acute findings. Small nonobstructive superior pole calculus of the left kidney. Unchanged thickening of the left greater than right adrenal glands. IMPRESSION: 1. Significant interval increase in sclerotic osseous metastatic disease throughout the included axial skeleton. 2. Multiple small bilateral solid and ground-glass nodules are unchanged compared to prior examination. These are nonspecific and very likely incidental benign sequelae of prior infection or inflammation, although metastatic disease is not strictly excluded. Attention on follow-up.  3. Mild subpleural radiation  fibrosis of the anterior right upper lobe. 4. Unchanged non nodular thickening of the adrenal glands in the included upper abdomen, most likely benign hyperplasia. Attention on follow-up. 5. Coronary artery disease. Aortic Atherosclerosis (ICD10-I70.0). Electronically Signed   By: Eddie Candle M.D.   On: 11/22/2020 09:02   NM Bone Scan Whole Body  Result Date: 12/20/2020 CLINICAL DATA:  Metastatic breast cancer EXAM: NUCLEAR MEDICINE WHOLE BODY BONE SCAN TECHNIQUE: Whole body anterior and posterior images were obtained approximately 3 hours after intravenous injection of radiopharmaceutical. RADIOPHARMACEUTICALS:  19.8 mCi Technetium-14mMDP IV COMPARISON:  CT chest dated 11/21/2020.  PET-CT dated 07/31/2020. FINDINGS: Left posterior calvarial metastasis. Multiple left posterior rib metastases, predominantly involving the 4th through 7th ribs on recent CT. Associated involvement of the upper thoracic vertebral bodies, likely T1-8. Additional involvement of the thoracolumbar spine at the thoracolumbar junction, likely L1-2 when correlating with recent CT. Metastases involving the right sacral ala and left iliac bone, which were also evident on PET. Additional metastasis involving the left proximal humerus. IMPRESSION: Multifocal osseous metastases, as above. Electronically Signed   By: SJulian HyM.D.   On: 12/20/2020 09:21     ELIGIBLE FOR AVAILABLE RESEARCH PROTOCOL: no  ASSESSMENT: 64y.o. [place] woman   (1) status post right lumpectomy and axillary lymph node dissection in 2007 for a stage III invasive breast cancer, additional pathology details to be reviewed when available  (a) a total of 13 axillary lymph nodes were removed, 11 positive  (b) adjuvant chemotherapy consisted of docetaxel, doxorubicin and cyclophosphamide x6  (c) the patient received adjuvant radiation  (d) received letrozole briefly but did not tolerate it.  METASTATIC DISEASE (2) CT  scans of the chest abdomen and pelvis 06/15/2020 shows widespread bony metastatic disease, no definitive visceral disease  (a) bone marrow biopsy  06/20/2020 nondiagnostic  (b) F 18 estradiol PET scan (cerianna) 07/31/2020 shows no estrogen uptake in the known bone lesions or elsewhere  (3) Capecitabine started on 08/24/2020 at 1500 mg twice daily, on radiation days only  (a) Xgeva started on 07/13/2020 given every 4 weeks  (b) capecitabine resumed 11/30/2020 at metronomic doses (1 g twice daily daily, with no breaks)  (4) palliative radiation completed 11/10/2020  (5) pain management:  (a) to start methadone 5 mg every 8 hours 11/30/2020.  (b) bowel prophylaxis: MiraLAX daily, stool softeners 2 tablets twice daily   PLAN: I reviewed the bone scan with [name] and her sister.  They understand that this is the actual baseline bone scan.  We reviewed the fact that lytic lesions when they start to heal look worse because they did not show in the scan until he began to heal.  Accordingly all the "new" lesions are really just lesions that were there but are now in the healing phase.  This bone scan though has been obtained after a few months of Xgeva and some chemo and therefore it will serve as a baseline.  The only way we are going to know if there is definitive progression is if we see new lesions on future bone scans  I strongly urged [name] to start the methadone.  She will take just 1 tablet the first day.  She will take 1 tablet twice a day, 12 hours apart on day 2, and then on day 3 she will try to take it 3 times a day.  If she has any side effects including excessive somnolence or nausea etc. she will let uKoreaknow.  If she has  pain in between the scheduled times for methadone she will not take the methadone earlier but she may take gabapentin or morphine if she needs to.  I am hoping we will be able with Dr. Mauricio Po help to titrate her to a level where she will be able to function much more  normally and generally be more comfortable  As far as the capecitabine, I do believe she is having a very favorable response to it but she has had a very hard time tolerating it.  Certainly the palmar plantar erythrodysesthesia is disabling in itself.  Accordingly we are not going to resume the capecitabine until January 1 and when she resumes that she will only take 2 tablets once a day.  I do not know if this dose will be too low for effectiveness but we are following her labs on a monthly basis, every time she has an Xgeva dose, so we should be able to get an idea after 3 or 4 months.  Of course if she develops the palmar plantar erythrodysesthesia even at this low dose we will have to switch to something different, most likely trodelvy.  She will return to see me with the February Xgeva dose but knows to call us for any other issues that may develop before then  Total encounter time 45 minutes.Sarajane Jews C. Lilton Pare, MD 12/20/20 3:21 PM Medical Oncology and Hematology [facility] [place], [place] [address] Tel. [phone]    Fax. [phone]   I, Wilburn Mylar, am acting as scribe for Dr. Virgie Dad. Saben Donigan.  I, Lurline Del MD, have reviewed the above documentation for accuracy and completeness, and I agree with the above.   *Total Encounter Time as defined by the Centers for Medicare and Medicaid Services includes, in addition to the face-to-face time of a patient visit (documented in the note above) non-face-to-face time: obtaining and reviewing outside history, ordering and reviewing medications, tests or procedures, care coordination (communications with other health care professionals or caregivers) and documentation in the medical record.

## 2020-12-20 ENCOUNTER — Other Ambulatory Visit: Payer: Self-pay

## 2020-12-20 ENCOUNTER — Inpatient Hospital Stay: Payer: Medicaid Other

## 2020-12-20 ENCOUNTER — Inpatient Hospital Stay (HOSPITAL_BASED_OUTPATIENT_CLINIC_OR_DEPARTMENT_OTHER): Payer: Medicaid Other | Admitting: Oncology

## 2020-12-20 VITALS — BP 148/68 | HR 84 | Temp 96.4°F | Resp 17 | Ht 65.0 in | Wt 143.0 lb

## 2020-12-20 DIAGNOSIS — Z170 Estrogen receptor positive status [ER+]: Secondary | ICD-10-CM | POA: Diagnosis not present

## 2020-12-20 DIAGNOSIS — C50811 Malignant neoplasm of overlapping sites of right female breast: Secondary | ICD-10-CM

## 2020-12-20 DIAGNOSIS — C7951 Secondary malignant neoplasm of bone: Secondary | ICD-10-CM | POA: Diagnosis not present

## 2020-12-20 LAB — COMPREHENSIVE METABOLIC PANEL
ALT: 23 U/L (ref 0–44)
AST: 26 U/L (ref 15–41)
Albumin: 3.5 g/dL (ref 3.5–5.0)
Alkaline Phosphatase: 192 U/L — ABNORMAL HIGH (ref 38–126)
Anion gap: 7 (ref 5–15)
BUN: 15 mg/dL (ref 8–23)
CO2: 26 mmol/L (ref 22–32)
Calcium: 8.5 mg/dL — ABNORMAL LOW (ref 8.9–10.3)
Chloride: 106 mmol/L (ref 98–111)
Creatinine, Ser: 0.71 mg/dL (ref 0.44–1.00)
GFR, Estimated: >60 mL/min (ref >60)
Glucose, Bld: 110 mg/dL — ABNORMAL HIGH (ref 70–99)
Potassium: 4.5 mmol/L (ref 3.5–5.1)
Sodium: 139 mmol/L (ref 135–145)
Total Bilirubin: 0.4 mg/dL (ref 0.3–1.2)
Total Protein: 7 g/dL (ref 6.5–8.1)

## 2020-12-20 LAB — CBC WITH DIFFERENTIAL/PLATELET
Abs Immature Granulocytes: 0.02 10*3/uL (ref 0.00–0.07)
Basophils Absolute: 0.1 10*3/uL (ref 0.0–0.1)
Basophils Relative: 1 %
Eosinophils Absolute: 0.1 10*3/uL (ref 0.0–0.5)
Eosinophils Relative: 3 %
HCT: 36.4 % (ref 36.0–46.0)
Hemoglobin: 12.2 g/dL (ref 12.0–15.0)
Immature Granulocytes: 0 %
Lymphocytes Relative: 30 %
Lymphs Abs: 1.5 10*3/uL (ref 0.7–4.0)
MCH: 31.3 pg (ref 26.0–34.0)
MCHC: 33.5 g/dL (ref 30.0–36.0)
MCV: 93.3 fL (ref 80.0–100.0)
Monocytes Absolute: 0.4 10*3/uL (ref 0.1–1.0)
Monocytes Relative: 7 %
Neutro Abs: 3 10*3/uL (ref 1.7–7.7)
Neutrophils Relative %: 59 %
Platelets: 179 10*3/uL (ref 150–400)
RBC: 3.9 MIL/uL (ref 3.87–5.11)
RDW: 15.6 % — ABNORMAL HIGH (ref 11.5–15.5)
WBC: 5.1 10*3/uL (ref 4.0–10.5)
nRBC: 0 % (ref 0.0–0.2)

## 2020-12-21 ENCOUNTER — Telehealth: Payer: Self-pay | Admitting: *Deleted

## 2020-12-21 LAB — CANCER ANTIGEN 27.29: CA 27.29: 312.8 U/mL — ABNORMAL HIGH (ref 0.0–38.6)

## 2020-12-21 LAB — CEA (IN HOUSE-CHCC): CEA (CHCC-In House): 1321.7 ng/mL — ABNORMAL HIGH (ref 0.00–5.00)

## 2020-12-21 NOTE — Telephone Encounter (Signed)
This

## 2020-12-25 ENCOUNTER — Telehealth: Payer: Self-pay | Admitting: Oncology

## 2020-12-25 NOTE — Telephone Encounter (Signed)
Scheduled appts per 12/22 los. Left voicemail with appt dates and times.

## 2020-12-27 ENCOUNTER — Telehealth: Payer: Self-pay | Admitting: *Deleted

## 2020-12-27 DIAGNOSIS — C7951 Secondary malignant neoplasm of bone: Secondary | ICD-10-CM

## 2020-12-27 DIAGNOSIS — Z170 Estrogen receptor positive status [ER+]: Secondary | ICD-10-CM

## 2020-12-27 NOTE — Telephone Encounter (Signed)
Pt left VM stating she is having issues with the referral to the Neuro Spine office and obtaining refills for gabapentin.  She states she is having to take more of the gabapentin due to extreme sciatica pain.  She is also taking short acting morphine and methadone.  This RN returned call to given number of [phone] and obtained identified VM- message left requesting a return call to hopefully speak person to person per her current issues.

## 2020-12-27 NOTE — Telephone Encounter (Signed)
This RN spoke with the pt per her return call per current issue of severe sciatica pain with increased use of gabapentin.  Catherine Tyler states she developed " severe left hip pain that runs down to the bottom of my foot and is in my groin as well "  She states the only difference in habits " I slept on a different mattress the other night before the pain occurred."  She states no change in bowel or bladder habits ( pt has known constipation and is on a bowel regimen) last BM was yesterday.  She is able to bear weight with no change in pain level but it does affect ambulation " I'm limping "  She is using methadone 5 mg TID MSIR 15 mg - as sparingly as possible Gabapentin- prescribed at 300 mg TID - " but I have been using more because of this nerve pain hoping it would help "  She states this AM she has taken a total of 900 mg and has received 2 accupuncture treatments.  She states pain is minimally relieved at current dose.  She states she will need refills on the gabapentin and MSIR to carry thru the weekend.  Of note pt was seen x 1 at pain management clinic- but she was late for her appointment yesterday ( " I got my time wrong by 30 minutes and they wouldn't see me or do any refills ")  Note pt completed radiation to above area completed 11/10/2020.  This note will be reviewed with covering provider for further recommendations.  Per review with [facility]/NP - recommends for the pt to get plain films of hips/femur to evaluate for possible fx due to known area of bone mets s/p radiation for best outcome.  This RN called patient - obtained her VM- message left to return call to this RN.

## 2020-12-28 ENCOUNTER — Other Ambulatory Visit: Payer: Self-pay

## 2020-12-28 ENCOUNTER — Ambulatory Visit (HOSPITAL_COMMUNITY)
Admission: RE | Admit: 2020-12-28 | Discharge: 2020-12-28 | Disposition: A | Discharge status: Home / Self Care | Payer: Medicaid Other | Source: Ambulatory Visit | Attending: Adult Health | Admitting: Adult Health

## 2020-12-28 ENCOUNTER — Inpatient Hospital Stay: Payer: Medicaid Other | Attending: Oncology | Admitting: Adult Health

## 2020-12-28 DIAGNOSIS — C50811 Malignant neoplasm of overlapping sites of right female breast: Secondary | ICD-10-CM | POA: Insufficient documentation

## 2020-12-28 DIAGNOSIS — G893 Neoplasm related pain (acute) (chronic): Secondary | ICD-10-CM

## 2020-12-28 DIAGNOSIS — Z170 Estrogen receptor positive status [ER+]: Secondary | ICD-10-CM | POA: Diagnosis present

## 2020-12-28 DIAGNOSIS — C7951 Secondary malignant neoplasm of bone: Secondary | ICD-10-CM

## 2020-12-28 MED ORDER — MORPHINE SULFATE 15 MG PO TABS: 15.0000 mg | ORAL_TABLET | ORAL | 0 refills | Status: DC | PRN

## 2020-12-28 MED ORDER — METHADONE HCL 5 MG PO TABS: 5.0000 mg | ORAL_TABLET | Freq: Three times a day (TID) | ORAL | 0 refills | Status: DC

## 2020-12-28 MED ORDER — METHYLPREDNISOLONE 4 MG PO TBPK: ORAL_TABLET | ORAL | 0 refills | Status: DC

## 2020-12-28 NOTE — Progress Notes (Signed)
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   She stopped her capecitabine on 12/11/20. She is restarting this on 12/31/2019 at 2 tablets a day due to palmar plantar erythrodysesthesia.    She has been having pain from her cancer.  She is taking Methadone 104m TID about two weeks ago, Morphine 15 mg every 4 hours as needed, and Gabapentin.  She started the gabapentin since the beginning of her cancer treatment, recently she increased this to taking extra tablets when needed.  She says this helps with her new onset of sciatic pain down her leg.  She also is taking acupuncture to help with this.    REVIEW OF SYSTEMS: AJennalynnsays she is tolerating her pain meds well, but struggles with the sciatica. She denies any issues with her pain medications.  She denies focal weakness, saddle anesthesia, or bowel/bladder incontinence.   A detailed ROS was otherwise non contributory.      COVID 19 VACCINATION STATUS: Not get vaccinated as of November 2021   HISTORY OF CURRENT ILLNESS: From the original intake note:  "Catherine Tyler" has a history of right breast cancer, diagnosed in 2007.  From her report, this appears to have been about 3 cm and to have involved 11 of the 13 right axillary lymph nodes involved.  [facility]  [facility] chemotherapy (TAC x6) , and radiation therapy.  She then tried Femara which she did not tolerate and no further antiestrogens were prescribed.  She presented to the ED yesterday, 06/15/2020, with diffuse back, chest, and abdominal pain, as well as unintentional weight loss. Chest x-ray performed at that time showed: concern for bone metastasis with pathologic fracture of left 5th rib.  She proceeded to chest, abdomen, pelvis CT that day, which showed: multifocal predominantly blastic-appearing osseous metastatic disease involving the thoracic and lumbar spine, multiple ribs most prominently at the left fifth through seventh ribs, and the right sacral ala; nonspecific mixed solid nodules and ill-defined ground-glass opacities in the left upper lobe measuring up to 7 mm and 6 mm in size; mild distal small bowel fecalization without evidence of obstruction.  Of note, her most recent mammogram on file is a bilateral diagnostic scan from 08/11/2018. At that time, she presented with a burning sensation to the inferior right breast. Mammogram showed: breast density category B; no mammographic evidence of malignancy in the bilateral breasts.   The patient's subsequent history is as detailed below.   PAST MEDICAL HISTORY: Past Medical History:  Diagnosis Date  . Cancer (H[place]   . Personal history of chemotherapy   . Personal history of radiation therapy     PAST SURGICAL HISTORY: Past Surgical History:  Procedure Laterality Date  . BREAST LUMPECTOMY    . BREAST SURGERY      FAMILY HISTORY: Family History  Problem Relation Age of Onset  .  Hyperlipidemia Mother   . Heart failure Mother   The patient's father died at age 68 from a myocardial infarction.  As far as the patient knows there is no history of cancer on that side of the family.  The patient's mother died at age 92 from renal failure.  The patient is not aware of any cancer on that side of the family.  The patient  herself has 5 sisters, no brothers.  1 sister [name]) had lung cancer.   GYNECOLOGIC HISTORY:  No LMP recorded (lmp unknown). Patient has had a hysterectomy. Menarche: 64 years old Age at first live birth: 64 years old [place] P 1 LMP status post hysterectomy at age 64 secondary to fibroids; status post remote conization for in situ cervical carcinoma BSO?    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  She lives by herself with her dog [name] who is a rescue.  The patient has been divorced 13 years as of June 2021.  Her son [name] lives in [place] where he works in Architect.  The patient has 1 grandchild.  She attends [employer].   ADVANCED DIRECTIVES: To be discussed   HEALTH MAINTENANCE: Social History   Tobacco Use  . Smoking status: Current Some Day Smoker  . Smokeless tobacco: Never Used  Vaping Use  . Vaping Use: Never used  Substance Use Topics  . Alcohol use: Yes    Comment: on occasion  . Drug use: Never     Colonoscopy: Never  PAP: 07/2018, negative  Bone density: Never   Allergies  Allergen Reactions  . Demerol [Meperidine] Nausea And Vomiting  . Sulfa Antibiotics Nausea And Vomiting  . Tape Other (See Comments)    Takes off skin    Current Outpatient Medications  Medication Sig Dispense Refill  . acetaminophen (TYLENOL) 500 MG tablet Take 1 tablet (500 mg total) by mouth with breakfast, with lunch, and with evening meal. Take together with aleve 220 mg 30 tablet 0  . bisacodyl (DULCOLAX) 10 MG suppository Place 1 suppository (10 mg total) rectally as needed for moderate constipation. 12 suppository 0  . capecitabine (XELODA) 500 MG tablet Take 2 tablets (1,000 mg total) by mouth daily. 120 tablet 6  . [employer] MAGNESIUM CITRATE 1.745 GM/30ML SOLN Take 296 mLs by mouth once.    . gabapentin (NEURONTIN) 300 MG capsule Take one tablet po day one, take one tablet po twice daily on day  two, then if tolerated continue taking one tablet three times per day 90 capsule 1  . lisinopril (ZESTRIL) 10 MG tablet Take 1 tablet (10 mg total) by mouth daily. 30 tablet 11  . methadone (DOLOPHINE) 5 MG tablet Take 1 tablet (5 mg total) by mouth every 8 (eight) hours. 30 tablet 0  . morphine (MSIR) 15 MG tablet Take 1 tablet (15 mg total) by mouth every 4 (four) hours as needed for severe pain. 180 tablet 0  . omeprazole (PRILOSEC) 40 MG capsule Take 1 capsule (40 mg total) by mouth daily. 90 capsule 3  . ondansetron (ZOFRAN ODT) 4 MG disintegrating tablet Take 1 tablet (4 mg total) by mouth every 8 (eight) hours as needed for nausea or vomiting. 20 tablet 0  . polyethylene glycol (MIRALAX / GLYCOLAX) 17 g packet Take 17 g by mouth daily.    Marland Kitchen senna-docusate (SENOKOT-S) 8.6-50 MG tablet Take 2 tablets by mouth 2 (two) times daily.  120 tablet 0  . sodium phosphate (FLEET) 7-19 GM/118ML ENEM Place 133 mLs (1 enema total) rectally daily as needed for severe constipation. 399 mL 0   No current facility-administered medications for this visit.    OBJECTIVE:  Patient sounds well and is in no apparent distress.  Mood and behavior are normal.   LAB RESULTS:  CMP     Component Value Date/Time   NA 139 12/20/2020 1501   NA 140 09/02/2018 1106   K 4.5 12/20/2020 1501   CL 106 12/20/2020 1501   CO2 26 12/20/2020 1501   GLUCOSE 110 (H) 12/20/2020 1501   BUN 15 12/20/2020 1501   BUN 22 09/02/2018 1106   CREATININE 0.71 12/20/2020 1501   CREATININE 0.64 12/05/2020 0954   CALCIUM 8.5 (L) 12/20/2020 1501   PROT 7.0 12/20/2020 1501   ALBUMIN 3.5 12/20/2020 1501   AST 26 12/20/2020 1501   AST 34 12/05/2020 0954   ALT 23 12/20/2020 1501   ALT 32 12/05/2020 0954   ALKPHOS 192 (H) 12/20/2020 1501   BILITOT 0.4 12/20/2020 1501   BILITOT 0.5 12/05/2020 0954   GFRNONAA >60 12/20/2020 1501   GFRNONAA >60 12/05/2020 0954   GFRAA >60 09/20/2020 1444    No results found for: TOTALPROTELP,  ALBUMINELP, A1GS, A2GS, BETS, BETA2SER, GAMS, MSPIKE, SPEI  Lab Results  Component Value Date   WBC 5.1 12/20/2020   NEUTROABS 3.0 12/20/2020   HGB 12.2 12/20/2020   HCT 36.4 12/20/2020   MCV 93.3 12/20/2020   PLT 179 12/20/2020    No results found for: LABCA2  No components found for: CVELFY101  No results for input(s): INR in the last 168 hours.  No results found for: LABCA2  No results found for: CAN199  No results found for: BPZ025  No results found for: ENI778  Lab Results  Component Value Date   CA2729 312.8 (H) 12/20/2020    No components found for: HGQUANT  Lab Results  Component Value Date   CEA1 1,321.70 (H) 12/20/2020   /  CEA ([place])  Date Value Ref Range Status  12/20/2020 1,321.70 (H) 0.00 - 5.00 ng/mL Final    Comment:    (NOTE) This test was performed using Architect's Chemiluminescent Microparticle Immunoassay. Values obtained from different assay methods cannot be used interchangeably. Please note that 5-10% of patients who smoke may see CEA levels up to 6.9 ng/mL. Performed at [facility], [place] [address] [place], [place] [address]      No results found for: AFPTUMOR  No results found for: CHROMOGRNA  No results found for: KPAFRELGTCHN, LAMBDASER, KAPLAMBRATIO (kappa/lambda light chains)  No results found for: HGBA, HGBA2QUANT, HGBFQUANT, HGBSQUAN (Hemoglobinopathy evaluation)   No results found for: LDH  No results found for: IRON, TIBC, IRONPCTSAT (Iron and TIBC)  No results found for: FERRITIN  Urinalysis    Component Value Date/Time   COLORURINE STRAW (A) 04/27/2020 0928   APPEARANCEUR CLEAR 04/27/2020 0928   LABSPEC 1.015 05/30/2020 1402   PHURINE 6.0 05/30/2020 1402   GLUCOSEU NEGATIVE 05/30/2020 1402   HGBUR NEGATIVE 05/30/2020 1402   BILIRUBINUR NEGATIVE 05/30/2020 1402   KETONESUR NEGATIVE 05/30/2020 1402   PROTEINUR NEGATIVE 05/30/2020 1402   UROBILINOGEN 0.2 05/30/2020 1402    NITRITE NEGATIVE 05/30/2020 1402   LEUKOCYTESUR NEGATIVE 05/30/2020 1402    STUDIES: NM Bone Scan Whole Body  Result Date: 12/20/2020 CLINICAL DATA:  Metastatic breast cancer EXAM: NUCLEAR MEDICINE WHOLE BODY BONE SCAN TECHNIQUE: Whole body anterior  and posterior images were obtained approximately 3 hours after intravenous injection of radiopharmaceutical. RADIOPHARMACEUTICALS:  19.8 mCi Technetium-1mMDP IV COMPARISON:  CT chest dated 11/21/2020.  PET-CT dated 07/31/2020. FINDINGS: Left posterior calvarial metastasis. Multiple left posterior rib metastases, predominantly involving the 4th through 7th ribs on recent CT. Associated involvement of the upper thoracic vertebral bodies, likely T1-8. Additional involvement of the thoracolumbar spine at the thoracolumbar junction, likely L1-2 when correlating with recent CT. Metastases involving the right sacral ala and left iliac bone, which were also evident on PET. Additional metastasis involving the left proximal humerus. IMPRESSION: Multifocal osseous metastases, as above. Electronically Signed   By: SJulian HyM.D.   On: 12/20/2020 09:21   DG HIPS BILAT WITH PELVIS 2V  Result Date: 12/28/2020 CLINICAL DATA:  Left hip pain.  History of metastatic breast cancer. EXAM: DG HIP (WITH OR WITHOUT PELVIS) 2V BILAT COMPARISON:  None. FINDINGS: There is no evidence of hip fracture or dislocation. There is no evidence of arthropathy or other focal bone abnormality. IMPRESSION: Negative. Electronically Signed   By: JMarijo ConceptionM.D.   On: 12/28/2020 09:47   DG FEMUR 1V LEFT  Result Date: 12/28/2020 CLINICAL DATA:  64year old female with a history breast cancer known bone metastases and pain EXAM: LEFT FEMUR 1 VIEW COMPARISON:  None. FINDINGS: There is no evidence of fracture or other focal bone lesions. Soft tissues are unremarkable. IMPRESSION: Negative. Electronically Signed   By: JCorrie MckusickD.O.   On: 12/28/2020 09:45     ELIGIBLE FOR  AVAILABLE RESEARCH PROTOCOL: no  ASSESSMENT: 64y.o. [place] woman   (1) status post right lumpectomy and axillary lymph node dissection in 2007 for a stage III invasive breast cancer, additional pathology details to be reviewed when available  (a) a total of 13 axillary lymph nodes were removed, 11 positive  (b) adjuvant chemotherapy consisted of docetaxel, doxorubicin and cyclophosphamide x6  (c) the patient received adjuvant radiation  (d) received letrozole briefly but did not tolerate it.  METASTATIC DISEASE (2) CT scans of the chest abdomen and pelvis 06/15/2020 shows widespread bony metastatic disease, no definitive visceral disease  (a) bone marrow biopsy  06/20/2020 nondiagnostic  (b) F 18 estradiol PET scan (cerianna) 07/31/2020 shows no estrogen uptake in the known bone lesions or elsewhere  (3) Capecitabine started on 08/24/2020 at 1500 mg twice daily, on radiation days only  (a) Xgeva started on 07/13/2020 given every 4 weeks  (b) capecitabine resumed 11/30/2020 at metronomic doses (1 g twice daily daily, with no breaks)  (4) palliative radiation completed 11/10/2020  (5) pain management:  (a) to start methadone 5 mg every 8 hours 11/30/2020.  (b) bowel prophylaxis: MiraLAX daily, stool softeners 2 tablets twice daily   PLAN: AArielis having some sciatic pain and she is taking gabapentin in addition to her current pain regimen.  Xrays from 12/30 are negative. She has no concerning symptoms for cord involvement.  I have placed an order for Medrol Dosepak and she is going to take this.  She will call uKoreaif it doesn't help, we may need to do further imaging with MRI.  We reviewed red flags and she understands.     We will see her back in 01/2021 for labs, f/u, and her next treatment.    Total encounter time on phone 21 minutes.    LWilber Bihari NP 12/28/20 4:09 PM Medical On[facility] [id number].  [phone]    Fax. [phone]    *Total Encounter Time as defined by the Centers for Medicare and Medicaid Services includes, in addition to the face-to-face time of a patient visit (documented in the note above) non-face-to-face time: obtaining and reviewing outside history, ordering and reviewing medications, tests or procedures, care coordination (communications with other health care professionals or caregivers) and documentation in the medical record.

## 2021-01-01 ENCOUNTER — Telehealth: Payer: Self-pay | Admitting: Adult Health

## 2021-01-01 NOTE — Telephone Encounter (Signed)
No 12/30 los, no changes made to pt schedule  

## 2021-01-02 ENCOUNTER — Inpatient Hospital Stay: Payer: Medicaid Other | Attending: Oncology

## 2021-01-02 ENCOUNTER — Other Ambulatory Visit: Payer: Self-pay

## 2021-01-02 ENCOUNTER — Inpatient Hospital Stay: Payer: Medicaid Other

## 2021-01-02 VITALS — BP 126/79 | HR 76 | Resp 18

## 2021-01-02 DIAGNOSIS — C50911 Malignant neoplasm of unspecified site of right female breast: Secondary | ICD-10-CM | POA: Insufficient documentation

## 2021-01-02 DIAGNOSIS — Z170 Estrogen receptor positive status [ER+]: Secondary | ICD-10-CM

## 2021-01-02 DIAGNOSIS — C7951 Secondary malignant neoplasm of bone: Secondary | ICD-10-CM | POA: Insufficient documentation

## 2021-01-02 DIAGNOSIS — C50811 Malignant neoplasm of overlapping sites of right female breast: Secondary | ICD-10-CM

## 2021-01-02 LAB — CBC WITH DIFFERENTIAL/PLATELET
Abs Immature Granulocytes: 0.04 10*3/uL (ref 0.00–0.07)
Basophils Absolute: 0.1 10*3/uL (ref 0.0–0.1)
Basophils Relative: 1 %
Eosinophils Absolute: 0.2 10*3/uL (ref 0.0–0.5)
Eosinophils Relative: 3 %
HCT: 41.8 % (ref 36.0–46.0)
Hemoglobin: 13.9 g/dL (ref 12.0–15.0)
Immature Granulocytes: 1 %
Lymphocytes Relative: 16 %
Lymphs Abs: 1.2 10*3/uL (ref 0.7–4.0)
MCH: 31.2 pg (ref 26.0–34.0)
MCHC: 33.3 g/dL (ref 30.0–36.0)
MCV: 93.7 fL (ref 80.0–100.0)
Monocytes Absolute: 0.7 10*3/uL (ref 0.1–1.0)
Monocytes Relative: 9 %
Neutro Abs: 5.6 10*3/uL (ref 1.7–7.7)
Neutrophils Relative %: 70 %
Platelets: 237 10*3/uL (ref 150–400)
RBC: 4.46 MIL/uL (ref 3.87–5.11)
RDW: 14.5 % (ref 11.5–15.5)
WBC: 7.9 10*3/uL (ref 4.0–10.5)
nRBC: 0 % (ref 0.0–0.2)

## 2021-01-02 LAB — COMPREHENSIVE METABOLIC PANEL
ALT: 26 U/L (ref 0–44)
AST: 32 U/L (ref 15–41)
Albumin: 3.6 g/dL (ref 3.5–5.0)
Alkaline Phosphatase: 227 U/L — ABNORMAL HIGH (ref 38–126)
Anion gap: 7 (ref 5–15)
BUN: 13 mg/dL (ref 8–23)
CO2: 26 mmol/L (ref 22–32)
Calcium: 8.7 mg/dL — ABNORMAL LOW (ref 8.9–10.3)
Chloride: 104 mmol/L (ref 98–111)
Creatinine, Ser: 0.67 mg/dL (ref 0.44–1.00)
GFR, Estimated: >60 mL/min (ref >60)
Glucose, Bld: 90 mg/dL (ref 70–99)
Potassium: 4 mmol/L (ref 3.5–5.1)
Sodium: 137 mmol/L (ref 135–145)
Total Bilirubin: 0.5 mg/dL (ref 0.3–1.2)
Total Protein: 7.3 g/dL (ref 6.5–8.1)

## 2021-01-02 MED ORDER — DENOSUMAB 120 MG/1.7ML ~~LOC~~ SOLN
120.0000 mg | Freq: Once | SUBCUTANEOUS | Status: AC
  Administered 2021-01-02: 120 mg via SUBCUTANEOUS

## 2021-01-02 MED ORDER — DENOSUMAB 120 MG/1.7ML ~~LOC~~ SOLN
SUBCUTANEOUS | Status: AC
  Filled 2021-01-02: qty 1.7

## 2021-01-02 NOTE — Patient Instructions (Signed)
Denosumab injection What is this medicine? DENOSUMAB (den oh sue mab) slows bone breakdown. Prolia is used to treat osteoporosis in women after menopause and in men, and in people who are taking corticosteroids for 6 months or more. Xgeva is used to treat a high calcium level due to cancer and to prevent bone fractures and other bone problems caused by multiple myeloma or cancer bone metastases. Xgeva is also used to treat giant cell tumor of the bone. This medicine may be used for other purposes; ask your health care provider or pharmacist if you have questions. COMMON BRAND NAME(S): Prolia, XGEVA What should I tell my health care provider before I take this medicine? They need to know if you have any of these conditions:  dental disease  having surgery or tooth extraction  infection  kidney disease  low levels of calcium or Vitamin D in the blood  malnutrition  on hemodialysis  skin conditions or sensitivity  thyroid or parathyroid disease  an unusual reaction to denosumab, other medicines, foods, dyes, or preservatives  pregnant or trying to get pregnant  breast-feeding How should I use this medicine? This medicine is for injection under the skin. It is given by a health care professional in a hospital or clinic setting. A special MedGuide will be given to you before each treatment. Be sure to read this information carefully each time. For Prolia, talk to your pediatrician regarding the use of this medicine in children. Special care may be needed. For Xgeva, talk to your pediatrician regarding the use of this medicine in children. While this drug may be prescribed for children as young as 13 years for selected conditions, precautions do apply. Overdosage: If you think you have taken too much of this medicine contact a poison control center or emergency room at once. NOTE: This medicine is only for you. Do not share this medicine with others. What if I miss a dose? It is  important not to miss your dose. Call your doctor or health care professional if you are unable to keep an appointment. What may interact with this medicine? Do not take this medicine with any of the following medications:  other medicines containing denosumab This medicine may also interact with the following medications:  medicines that lower your chance of fighting infection  steroid medicines like prednisone or cortisone This list may not describe all possible interactions. Give your health care provider a list of all the medicines, herbs, non-prescription drugs, or dietary supplements you use. Also tell them if you smoke, drink alcohol, or use illegal drugs. Some items may interact with your medicine. What should I watch for while using this medicine? Visit your doctor or health care professional for regular checks on your progress. Your doctor or health care professional may order blood tests and other tests to see how you are doing. Call your doctor or health care professional for advice if you get a fever, chills or sore throat, or other symptoms of a cold or flu. Do not treat yourself. This drug may decrease your body's ability to fight infection. Try to avoid being around people who are sick. You should make sure you get enough calcium and vitamin D while you are taking this medicine, unless your doctor tells you not to. Discuss the foods you eat and the vitamins you take with your health care professional. See your dentist regularly. Brush and floss your teeth as directed. Before you have any dental work done, tell your dentist you are   receiving this medicine. Do not become pregnant while taking this medicine or for 5 months after stopping it. Talk with your doctor or health care professional about your birth control options while taking this medicine. Women should inform their doctor if they wish to become pregnant or think they might be pregnant. There is a potential for serious side  effects to an unborn child. Talk to your health care professional or pharmacist for more information. What side effects may I notice from receiving this medicine? Side effects that you should report to your doctor or health care professional as soon as possible:  allergic reactions like skin rash, itching or hives, swelling of the face, lips, or tongue  bone pain  breathing problems  dizziness  jaw pain, especially after dental work  redness, blistering, peeling of the skin  signs and symptoms of infection like fever or chills; cough; sore throat; pain or trouble passing urine  signs of low calcium like fast heartbeat, muscle cramps or muscle pain; pain, tingling, numbness in the hands or feet; seizures  unusual bleeding or bruising  unusually weak or tired Side effects that usually do not require medical attention (report to your doctor or health care professional if they continue or are bothersome):  constipation  diarrhea  headache  joint pain  loss of appetite  muscle pain  runny nose  tiredness  upset stomach This list may not describe all possible side effects. Call your doctor for medical advice about side effects. You may report side effects to FDA at 1-800-FDA-1088. Where should I keep my medicine? This medicine is only given in a clinic, doctor's office, or other health care setting and will not be stored at home. NOTE: This sheet is a summary. It may not cover all possible information. If you have questions about this medicine, talk to your doctor, pharmacist, or health care provider.  2020 [employer] Standard (2018-04-24 16:10:44)

## 2021-01-03 ENCOUNTER — Telehealth: Payer: Self-pay

## 2021-01-03 MED ORDER — GABAPENTIN 300 MG PO CAPS: ORAL_CAPSULE | ORAL | 0 refills | Status: DC

## 2021-01-03 NOTE — Telephone Encounter (Signed)
Patient called requesting refill on Gabapentin.  Pt verbalizes she is having to use this medication more than prescribed due to increased sciatica pain.  Patient reports taking Gabapentin 300mg  Q 2 hours, along with Methadone and Morphine with minimal effectiveness.  Patient has been utilizing accupuncture.    RN reviewed with MD -  MD inquired about usage of Methadone and Morphine.  Patient is taking and tolerating Methadone 5mg  Q 8 hours well.  Continues on dose of Morphine as prescribed.  Patient is doing well with avoiding constipation.    MD provided verbal orders for refill on Gabapentin 300mg  3 tablets QID, dispense 360 with no refills. Patient aware.   MD will increase patient's methadone from 5mg  to 10mg  - RN will provide request for MD to complete and desk nurse will follow up to inform patient on 1/6.

## 2021-01-03 NOTE — Telephone Encounter (Signed)
RN returned call, voicemail left for return call.  RR:[id number] dosage.

## 2021-01-08 ENCOUNTER — Other Ambulatory Visit: Payer: Self-pay | Admitting: Oncology

## 2021-01-08 DIAGNOSIS — C7951 Secondary malignant neoplasm of bone: Secondary | ICD-10-CM

## 2021-01-08 DIAGNOSIS — C50811 Malignant neoplasm of overlapping sites of right female breast: Secondary | ICD-10-CM

## 2021-01-08 DIAGNOSIS — Z170 Estrogen receptor positive status [ER+]: Secondary | ICD-10-CM

## 2021-01-08 DIAGNOSIS — G893 Neoplasm related pain (acute) (chronic): Secondary | ICD-10-CM

## 2021-01-08 MED ORDER — METHADONE HCL 10 MG PO TABS: 10.0000 mg | ORAL_TABLET | Freq: Three times a day (TID) | ORAL | 0 refills | Status: DC

## 2021-01-22 ENCOUNTER — Telehealth: Payer: Self-pay | Admitting: *Deleted

## 2021-01-22 NOTE — Telephone Encounter (Signed)
This RN spoke with pt per VM stating " can you get me in to see a GI doctor - I am impacted and really do not want to have to go to the ER "  Catherine Tyler states " the stool is at the very bottom of my rectum and I am going but like in pieces "  She called Dr Perley Jain office and is scheduled for an appointment for 8am tomorrow.  She states she usually uses stool softeners but over the past week developed " more straining and I don't know if I messed my bowels up "  She took one dose of miralax 3 days ago.  She states her stomach " is gripping and gurgling " she does report some nausea and has taken a zofran today.  This RN discussed probable issue related to increased use of pain meds due to recent sciatica as well as medications like zofran is constipating.  This RN informed pt she may benefit from use of glycerin suppositories to help hardened stool in rectum soften as well as help the tissues with ease of stool passing.  Catherine Tyler will follow up with Dr Watt Climes in the am.  Of note she states she has called the [facility] " 2 or 3 times in the past 2 weeks to schedule an appointment but have not heard back ".  Note pt was seen x 1 - then with second appointment marked as no show - and not seen per she arrived 30 minutes late for her appt.  Presently patient is scheduled for follow up with Dr Jana Hakim on 02/06/2021.  No further needs at this time.

## 2021-01-23 ENCOUNTER — Inpatient Hospital Stay (HOSPITAL_BASED_OUTPATIENT_CLINIC_OR_DEPARTMENT_OTHER): Payer: Medicaid Other | Admitting: Medical

## 2021-01-23 ENCOUNTER — Other Ambulatory Visit: Payer: Self-pay | Admitting: Oncology

## 2021-01-23 ENCOUNTER — Telehealth: Payer: Self-pay | Admitting: *Deleted

## 2021-01-23 ENCOUNTER — Inpatient Hospital Stay: Payer: Medicaid Other

## 2021-01-23 ENCOUNTER — Ambulatory Visit (HOSPITAL_COMMUNITY)
Admission: RE | Admit: 2021-01-23 | Discharge: 2021-01-23 | Disposition: A | Discharge status: Home / Self Care | Payer: Medicaid Other | Source: Ambulatory Visit | Attending: Oncology | Admitting: Oncology

## 2021-01-23 ENCOUNTER — Other Ambulatory Visit: Payer: Self-pay

## 2021-01-23 ENCOUNTER — Other Ambulatory Visit: Payer: Self-pay | Admitting: *Deleted

## 2021-01-23 VITALS — BP 112/65 | HR 69 | Temp 97.3°F | Resp 16 | Ht 65.0 in | Wt 134.1 lb

## 2021-01-23 DIAGNOSIS — K5903 Drug induced constipation: Secondary | ICD-10-CM

## 2021-01-23 DIAGNOSIS — C7951 Secondary malignant neoplasm of bone: Secondary | ICD-10-CM

## 2021-01-23 DIAGNOSIS — Z170 Estrogen receptor positive status [ER+]: Secondary | ICD-10-CM

## 2021-01-23 DIAGNOSIS — R112 Nausea with vomiting, unspecified: Secondary | ICD-10-CM | POA: Diagnosis not present

## 2021-01-23 DIAGNOSIS — C50811 Malignant neoplasm of overlapping sites of right female breast: Secondary | ICD-10-CM

## 2021-01-23 LAB — CBC WITH DIFFERENTIAL (CANCER CENTER ONLY)
Abs Immature Granulocytes: 0.04 10*3/uL (ref 0.00–0.07)
Basophils Absolute: 0 10*3/uL (ref 0.0–0.1)
Basophils Relative: 1 %
Eosinophils Absolute: 0.1 10*3/uL (ref 0.0–0.5)
Eosinophils Relative: 1 %
HCT: 41.4 % (ref 36.0–46.0)
Hemoglobin: 13.8 g/dL (ref 12.0–15.0)
Immature Granulocytes: 1 %
Lymphocytes Relative: 14 %
Lymphs Abs: 0.9 10*3/uL (ref 0.7–4.0)
MCH: 31.2 pg (ref 26.0–34.0)
MCHC: 33.3 g/dL (ref 30.0–36.0)
MCV: 93.7 fL (ref 80.0–100.0)
Monocytes Absolute: 0.4 10*3/uL (ref 0.1–1.0)
Monocytes Relative: 7 %
Neutro Abs: 4.9 10*3/uL (ref 1.7–7.7)
Neutrophils Relative %: 76 %
Platelet Count: 216 10*3/uL (ref 150–400)
RBC: 4.42 MIL/uL (ref 3.87–5.11)
RDW: 14.2 % (ref 11.5–15.5)
WBC Count: 6.3 10*3/uL (ref 4.0–10.5)
nRBC: 0 % (ref 0.0–0.2)

## 2021-01-23 LAB — CMP (CANCER CENTER ONLY)
ALT: 24 U/L (ref 0–44)
AST: 34 U/L (ref 15–41)
Albumin: 3.5 g/dL (ref 3.5–5.0)
Alkaline Phosphatase: 276 U/L — ABNORMAL HIGH (ref 38–126)
Anion gap: 7 (ref 5–15)
BUN: 10 mg/dL (ref 8–23)
CO2: 24 mmol/L (ref 22–32)
Calcium: 8.2 mg/dL — ABNORMAL LOW (ref 8.9–10.3)
Chloride: 106 mmol/L (ref 98–111)
Creatinine: 0.62 mg/dL (ref 0.44–1.00)
GFR, Estimated: >60 mL/min (ref >60)
Glucose, Bld: 101 mg/dL — ABNORMAL HIGH (ref 70–99)
Potassium: 3.9 mmol/L (ref 3.5–5.1)
Sodium: 137 mmol/L (ref 135–145)
Total Bilirubin: 0.7 mg/dL (ref 0.3–1.2)
Total Protein: 7.2 g/dL (ref 6.5–8.1)

## 2021-01-23 LAB — CEA (IN HOUSE-CHCC): CEA (CHCC-In House): 2345.62 ng/mL — ABNORMAL HIGH (ref 0.00–5.00)

## 2021-01-23 MED ORDER — PROCHLORPERAZINE MALEATE 10 MG PO TABS: 10.0000 mg | ORAL_TABLET | Freq: Four times a day (QID) | ORAL | 3 refills | Status: DC | PRN

## 2021-01-23 MED ORDER — PROMETHAZINE HCL 25 MG RE SUPP: 25.0000 mg | Freq: Four times a day (QID) | RECTAL | 2 refills | Status: DC | PRN

## 2021-01-23 NOTE — Telephone Encounter (Signed)
No entry 

## 2021-01-23 NOTE — Progress Notes (Unsigned)
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   She stopped her capecitabine about 2 weeks ago.  We had dropped her dose from 3 tablets twice daily 14 days on/7 days off, to 2 tablets twice daily taken continuously.  Unfortunately this did cause palmar plantar erythrodysesthesia again.  Also [name] complains of other problems including worse pain and particularly her left leg goal to get out of bed because the leg was weak.  Nausea, and sleeping a lot.  It is unclear how much all of that was due to the capecitabine but the patient feels most or all of it was.  She saw Dr. Shearon Stalls who recommended that [name] go back and give methadone a try but she has not done it yet.  She is just "a scarady catt" and is afraid that she may die if she overdoses.  Since her last visit, she underwent bone scan yesterday, 12/19/2020, showing multiple areas of uptake.  This will be our baseline study.  We are following her alkaline phosphatase and tumor marker.  Both are trending very favorably Results for Catherine, Tyler (MRN [id number]) as of 12/20/2020 16:03  Ref. Range 06/20/2020 08:22 07/13/2020 12:50 08/17/2020 11:03 09/20/2020 14:44 10/11/2020 14:39 11/01/2020 12:16 12/05/2020 09:54  Alkaline Phosphatase Latest Ref Range: 38 - 126 U/L 368 (H) 335 (H) 223 (H) 223 (H) 182 (H) 194 (H) 135 (H)  Results for Catherine, Tyler (MRN [id number]) as of 12/20/2020 16:03  Ref. Range 06/20/2020 08:22 08/17/2020 11:03 09/20/2020 14:44 10/11/2020 14:39 12/05/2020 09:54  CA 27.29 Latest Ref Range: 0.0 - 38.6 U/mL 400.7 (H) 484.8 (H) 435.2 (H) 425.1 (H) 310.3 (H)    REVIEW OF SYSTEMS: [name] was lying on a tennis ball under her left hip to see if that helps her pain.  I do think that possibly that is what caused the worsening pain and partial paresis on the left leg.  The left leg still hurts but it is no longer so weak.  For pain currently [name] continues to take short acting morphine on a very irregular schedule, sometimes up to 6 or 7 times a day.  She says she is doing better on her bowel movements.  She does not follow a regular bowel prophylaxis but manages to have a bowel movement every 2 days or so she says most of which are not too hard she says.   COVID 19 VACCINATION STATUS: Not get vaccinated as of November 2021   HISTORY OF CURRENT ILLNESS: From the original intake note:  "[name]" has a history of right breast cancer, diagnosed in 2007.  From her report, this appears to have been about 3 cm and to have involved 11 of the 13 right axillary lymph nodes involved.  She was [facility] at [facility] by Dr.  She was treated under Dr Epimenio Foot in [facility] with lumpectomy, chemotherapy (TAC x6) , and radiation therapy.  She then tried Femara which she did not  tolerate and no further antiestrogens were prescribed.  She presented to the ED yesterday, 06/15/2020, with diffuse back, chest, and abdominal pain, as well as unintentional weight loss. Chest x-ray performed at that time showed: concern for bone metastasis with pathologic fracture of left 5th rib.  She proceeded to chest, abdomen, pelvis CT that day, which showed: multifocal predominantly blastic-appearing osseous metastatic disease involving the thoracic and lumbar spine, multiple ribs most prominently at the left fifth through seventh ribs, and the right  sacral ala; nonspecific mixed solid nodules and ill-defined ground-glass opacities in the left upper lobe measuring up to 7 mm and 6 mm in size; mild distal small bowel fecalization without evidence of obstruction.  Of note, her most recent mammogram on file is a bilateral diagnostic scan from 08/11/2018. At that time, she presented with a burning sensation to the inferior right breast. Mammogram showed: breast density category B; no mammographic evidence of malignancy in the bilateral breasts.   The patient's subsequent history is as detailed below.   PAST MEDICAL HISTORY: Past Medical History:  Diagnosis Date  . Cancer ([place])   . Personal history of chemotherapy   . Personal history of radiation therapy     PAST SURGICAL HISTORY: Past Surgical History:  Procedure Laterality Date  . BREAST LUMPECTOMY    . BREAST SURGERY      FAMILY HISTORY: Family History  Problem Relation Age of Onset  . Hyperlipidemia Mother   . Heart failure Mother   The patient's father died at age 4 from a myocardial infarction.  As far as the patient knows there is no history of cancer on that side of the family.  The patient's mother died at age 35 from renal failure.  The patient is not aware of any cancer on that side of the family.  The patient herself has 5 sisters, no brothers.  1 sister [name]) had lung cancer.   GYNECOLOGIC HISTORY:  No LMP recorded (lmp unknown). Patient has had a hysterectomy. Menarche: 65 years old Age at first live birth: 65 years old Berry P 1 LMP status post hysterectomy at age 28 secondary to fibroids; status post remote conization for in situ cervical carcinoma BSO?    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  She lives by herself with her dog [name] who is a rescue.  The patient has been divorced 13 years as of June 2021.  Her son Catherine Tyler lives in [place] where he works in Architect.  The patient has  1 grandchild.  She attends [employer].   ADVANCED DIRECTIVES: To be discussed   HEALTH MAINTENANCE: Social History   Tobacco Use  . Smoking status: Current Some Day Smoker  . Smokeless tobacco: Never Used  Vaping Use  . Vaping Use: Never used  Substance Use Topics  . Alcohol use: Yes    Comment: on occasion  . Drug use: Never     Colonoscopy: Never  PAP: 07/2018, negative  Bone density: Never   Allergies  Allergen Reactions  . Demerol [Meperidine] Nausea And Vomiting  . Sulfa Antibiotics Nausea And Vomiting  . Tape Other (See Comments)    Takes off skin    Current Outpatient Medications  Medication Sig Dispense Refill  . acetaminophen (TYLENOL) 500 MG tablet Take 1 tablet (500 mg total) by mouth with breakfast, with lunch, and with evening meal. Take together with aleve 220 mg 30 tablet 0  .  bisacodyl (DULCOLAX) 10 MG suppository Place 1 suppository (10 mg total) rectally as needed for moderate constipation. 12 suppository 0  . capecitabine (XELODA) 500 MG tablet Take 2 tablets (1,000 mg total) by mouth daily. 120 tablet 6  . [employer] MAGNESIUM CITRATE 1.745 GM/30ML SOLN Take 296 mLs by mouth once.    . gabapentin (NEURONTIN) 300 MG capsule Gabapentin 300mg  3 tablets PO QID #360 with no refills 360 capsule 0  . lisinopril (ZESTRIL) 10 MG tablet Take 1 tablet (10 mg total) by mouth daily. 30 tablet 11  . methadone (DOLOPHINE) 10 MG tablet Take 1 tablet (10 mg total) by mouth every 8 (eight) hours. 90 tablet 0  . methylPREDNISolone (MEDROL DOSEPAK) 4 MG TBPK tablet Taper 6,5,4,3,2,1 21 tablet 0  . morphine (MSIR) 15 MG tablet Take 1 tablet (15 mg total) by mouth every 4 (four) hours as needed for severe pain. 180 tablet 0  . omeprazole (PRILOSEC) 40 MG capsule Take 1 capsule (40 mg total) by mouth daily. 90 capsule 3  . ondansetron (ZOFRAN ODT) 4 MG disintegrating tablet Take 1 tablet (4 mg total) by mouth every 8 (eight) hours as needed for nausea or vomiting. 20 tablet  0  . polyethylene glycol (MIRALAX / GLYCOLAX) 17 g packet Take 17 g by mouth daily.    Marland Kitchen senna-docusate (SENOKOT-S) 8.6-50 MG tablet Take 2 tablets by mouth 2 (two) times daily. 120 tablet 0  . sodium phosphate (FLEET) 7-19 GM/118ML ENEM Place 133 mLs (1 enema total) rectally daily as needed for severe constipation. 399 mL 0   No current facility-administered medications for this visit.    OBJECTIVE: White woman who appears stated age  There were no vitals filed for this visit. Wt Readings from Last 3 Encounters:  12/20/20 143 lb (64.9 kg)  11/30/20 143 lb 9.6 oz (65.1 kg)  11/09/20 142 lb 12.8 oz (64.8 kg)   There is no height or weight on file to calculate BMI.    ECOG FS:2 - Symptomatic, <50% confined to bed  Sclerae unicteric, EOMs intact Wearing a mask No cervical or supraclavicular adenopathy Lungs no rales or rhonchi Heart regular rate and rhythm Abd soft, nontender, positive bowel sounds MSK no focal spinal tenderness, no upper extremity lymphedema Neuro: nonfocal, well oriented, appropriate affect Breasts: Deferred   LAB RESULTS:  CMP     Component Value Date/Time   NA 137 01/02/2021 1228   NA 140 09/02/2018 1106   K 4.0 01/02/2021 1228   CL 104 01/02/2021 1228   CO2 26 01/02/2021 1228   GLUCOSE 90 01/02/2021 1228   BUN 13 01/02/2021 1228   BUN 22 09/02/2018 1106   CREATININE 0.67 01/02/2021 1228   CREATININE 0.64 12/05/2020 0954   CALCIUM 8.7 (L) 01/02/2021 1228   PROT 7.3 01/02/2021 1228   ALBUMIN 3.6 01/02/2021 1228   AST 32 01/02/2021 1228   AST 34 12/05/2020 0954   ALT 26 01/02/2021 1228   ALT 32 12/05/2020 0954   ALKPHOS 227 (H) 01/02/2021 1228   BILITOT 0.5 01/02/2021 1228   BILITOT 0.5 12/05/2020 0954   GFRNONAA >60 01/02/2021 1228   GFRNONAA >60 12/05/2020 0954   GFRAA >60 09/20/2020 1444    No results found for: TOTALPROTELP, ALBUMINELP, A1GS, A2GS, BETS, BETA2SER, GAMS, MSPIKE, SPEI  Lab Results  Component Value Date   WBC 7.9  01/02/2021   NEUTROABS 5.6 01/02/2021   HGB 13.9 01/02/2021   HCT 41.8 01/02/2021   MCV 93.7 01/02/2021   PLT 237  01/02/2021    No results found for: LABCA2  No components found for: XNTZGY174  No results for input(s): INR in the last 168 hours.  No results found for: LABCA2  No results found for: CAN199  No results found for: BSW967  No results found for: RFF638  Lab Results  Component Value Date   CA2729 312.8 (H) 12/20/2020    No components found for: HGQUANT  Lab Results  Component Value Date   CEA1 1,321.70 (H) 12/20/2020   /  CEA ([place])  Date Value Ref Range Status  12/20/2020 1,321.70 (H) 0.00 - 5.00 ng/mL Final    Comment:    (NOTE) This test was performed using Architect's Chemiluminescent Microparticle Immunoassay. Values obtained from different assay methods cannot be used interchangeably. Please note that 5-10% of patients who smoke may see CEA levels up to 6.9 ng/mL. Performed at [facility], [place] [address] [place], [place] [address]      No results found for: AFPTUMOR  No results found for: CHROMOGRNA  No results found for: KPAFRELGTCHN, LAMBDASER, KAPLAMBRATIO (kappa/lambda light chains)  No results found for: HGBA, HGBA2QUANT, HGBFQUANT, HGBSQUAN (Hemoglobinopathy evaluation)   No results found for: LDH  No results found for: IRON, TIBC, IRONPCTSAT (Iron and TIBC)  No results found for: FERRITIN  Urinalysis    Component Value Date/Time   COLORURINE STRAW (A) 04/27/2020 0928   APPEARANCEUR CLEAR 04/27/2020 0928   LABSPEC 1.015 05/30/2020 1402   PHURINE 6.0 05/30/2020 1402   GLUCOSEU NEGATIVE 05/30/2020 1402   HGBUR NEGATIVE 05/30/2020 1402   BILIRUBINUR NEGATIVE 05/30/2020 1402   KETONESUR NEGATIVE 05/30/2020 1402   PROTEINUR NEGATIVE 05/30/2020 1402   UROBILINOGEN 0.2 05/30/2020 1402   NITRITE NEGATIVE 05/30/2020 1402   LEUKOCYTESUR NEGATIVE 05/30/2020 1402    STUDIES: DG HIPS BILAT  WITH PELVIS 2V  Result Date: 12/28/2020 CLINICAL DATA:  Left hip pain.  History of metastatic breast cancer. EXAM: DG HIP (WITH OR WITHOUT PELVIS) 2V BILAT COMPARISON:  None. FINDINGS: There is no evidence of hip fracture or dislocation. There is no evidence of arthropathy or other focal bone abnormality. IMPRESSION: Negative. Electronically Signed   By: Marijo Conception M.D.   On: 12/28/2020 09:47   DG FEMUR 1V LEFT  Result Date: 12/28/2020 CLINICAL DATA:  65 year old female with a history breast cancer known bone metastases and pain EXAM: LEFT FEMUR 1 VIEW COMPARISON:  None. FINDINGS: There is no evidence of fracture or other focal bone lesions. Soft tissues are unremarkable. IMPRESSION: Negative. Electronically Signed   By: Corrie Mckusick D.O.   On: 12/28/2020 09:45     ELIGIBLE FOR AVAILABLE RESEARCH PROTOCOL: no  ASSESSMENT: 65 y.o. [place] woman   (1) status post right lumpectomy and axillary lymph node dissection in 2007 for a stage III invasive breast cancer, additional pathology details to be reviewed when available  (a) a total of 13 axillary lymph nodes were removed, 11 positive  (b) adjuvant chemotherapy consisted of docetaxel, doxorubicin and cyclophosphamide x6  (c) the patient received adjuvant radiation  (d) received letrozole briefly but did not tolerate it.  METASTATIC DISEASE (2) CT scans of the chest abdomen and pelvis 06/15/2020 shows widespread bony metastatic disease, no definitive visceral disease  (a) bone marrow biopsy  06/20/2020 nondiagnostic  (b) F 18 estradiol PET scan (cerianna) 07/31/2020 shows no estrogen uptake in the known bone lesions or elsewhere  (3) Capecitabine started on 08/24/2020 at 1500 mg twice daily, on radiation days only  (  a) Xgeva started on 07/13/2020 given every 4 weeks  (b) capecitabine resumed 11/30/2020 at metronomic doses (1 g twice daily daily, with no breaks)  (4) palliative radiation completed 11/10/2020  (5) pain  management:  (a) to start methadone 5 mg every 8 hours 11/30/2020.  (b) bowel prophylaxis: MiraLAX daily, stool softeners 2 tablets twice daily   PLAN: I reviewed the bone scan with [name] and her sister.  They understand that this is the actual baseline bone scan.  We reviewed the fact that lytic lesions when they start to heal look worse because they did not show in the scan until he began to heal.  Accordingly all the "new" lesions are really just lesions that were there but are now in the healing phase.  This bone scan though has been obtained after a few months of Xgeva and some chemo and therefore it will serve as a baseline.  The only way we are going to know if there is definitive progression is if we see new lesions on future bone scans  I strongly urged [name] to start the methadone.  She will take just 1 tablet the first day.  She will take 1 tablet twice a day, 12 hours apart on day 2, and then on day 3 she will try to take it 3 times a day.  If she has any side effects including excessive somnolence or nausea etc. she will let us know.  If she has pain in between the scheduled times for methadone she will not take the methadone earlier but she may take gabapentin or morphine if she needs to.  I am hoping we will be able with Dr. Mauricio Po help to titrate her to a level where she will be able to function much more normally and generally be more comfortable  As far as the capecitabine, I do believe she is having a very favorable response to it but she has had a very hard time tolerating it.  Certainly the palmar plantar erythrodysesthesia is disabling in itself.  Accordingly we are not going to resume the capecitabine until January 1 and when she resumes that she will only take 2 tablets once a day.  I do not know if this dose will be too low for effectiveness but we are following her labs on a monthly basis, every time she has an Xgeva dose, so we should be able to get an idea after 3 or 4  months.  Of course if she develops the palmar plantar erythrodysesthesia even at this low dose we will have to switch to something different, most likely trodelvy.  She will return to see me with the February Xgeva dose but knows to call us for any other issues that may develop before then  Total encounter time 45 minutes.Sarajane Jews C. Neshawn Aird, MD 01/23/21 9:20 AM Medical Oncology and Hematology [facility] [place], [place] [address] Tel. [phone]    Fax. [phone]   I, Wilburn Mylar, am acting as scribe for Dr. Virgie Dad. Yavonne Kiss.  I, Lurline Del MD, have reviewed the above documentation for accuracy and completeness, and I agree with the above.   *Total Encounter Time as defined by the Centers for Medicare and Medicaid Services includes, in addition to the face-to-face time of a patient visit (documented in the note above) non-face-to-face time: obtaining and reviewing outside history, ordering and reviewing medications, tests or procedures, care coordination (communications with other health care professionals or caregivers) and  documentation in the medical record.

## 2021-01-23 NOTE — Telephone Encounter (Signed)
Pt went to GI office this am due to ongoing bowel issues and " feeling impacted " ( see note from yesterday).  Apparently while at the GI office- pt was hypotensive - Catherine Tyler called to Catherine Tyler- who recommended due to BP pt proceed to the ER.  This RN received a call at 29 from the patient's sister- Catherine Tyler - who stated they returned home and called EMT per concern.  EMT is there now with noted bp reading of 132/80- pt alert and oriented. EMT is informing pt and sister of 32 hour wait in ER- as well as presently ambulances not available for transport ( this is per Catherine Tyler )- EMTs can give IVF and IV zofran in the home now and Catherine Tyler is asking " can we then get the xray ( apparently KUB was discussed by someone ) instead of going to the ER ".  Per review with MD- proceed with above and see if [facility] provider can see today post KUB - and then if needed proceed to the ER with more known issues.  This RN reviewed with [facility] nurse - obtained time, orders entered and informed sister of above plan with agreement.

## 2021-01-24 LAB — CANCER ANTIGEN 27.29: CA 27.29: 514.5 U/mL — ABNORMAL HIGH (ref 0.0–38.6)

## 2021-01-28 NOTE — Progress Notes (Signed)
  Radiation Oncology         [phone] ________________________________  Name: Catherine Tyler MRN: [id number]  Date: 11/10/2020  DOB: 1956/02/20  End of Treatment Note  Diagnosis:   stage IV breast cancer       Indication for treatment::  palliative       Radiation treatment dates:   10/23/20 - 11/10/20  Site/dose:    1.  The left chest was treated to a dose of 37.5 Gy in 15 fractios using a 5-field 3D conformal technique.  2.  The right sacrum was treated to a dose of 37.5 Gy in 15 fractios using a 4-field isodose technique.  Narrative: The patient tolerated radiation treatment relatively well.     Plan: The patient has completed radiation treatment. The patient will return to radiation oncology clinic for routine followup in one month. I advised the patient to call or return sooner if they have any questions or concerns related to their recovery or treatment. ________________________________  Jodelle Gross, M.D., Ph.D.

## 2021-01-29 NOTE — Progress Notes (Signed)
[facility] Progress Note   Catherine Tyler [id number] 1956/05/24 65 y.o.  Catherine Tyler is managed by Dr. 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 Jana Hakim and is treated with capecitabine and Xgeva.  She is scheduled to be seen next on 02/06/2021.  Constipation: Patient was given a handout on constipation management.  She was told it was okay for her to use Dulcolax suppositories that she has at home if needed.  She was told to use Gas-X for bloating.  Nausea and vomiting: Ms. Bleak was referred for an abdominal x-ray which returned negative.  She was given a prescription for oral Compazine and Phenergan suppositories.  She was told to push fluids.  Please see After Visit Summary for patient specific instructions.  Future Appointments  Date Time Provider [place]  02/06/2021 12:30 PM [facility] None  02/06/2021  1:00 PM Magrinat, Virgie Dad, MD [facility] None  02/06/2021  1:30 PM [facility] [place] [facility] None  03/06/2021  1:00 PM [facility] None  03/06/2021  1:45 PM [facility] [place] [facility] None  04/03/2021  1:00 PM [facility] None  04/03/2021  1:45 PM [facility] [place] FL[facility] None    No orders of the defined types were placed in this encounter.      Subjective:   Patient ID:  Catherine Tyler is a 65 y.o. (DOB 23-Jul-1956) female.  Chief Complaint: No chief complaint on file.   HPI Catherine Tyler is a 65 y.o. female with a diagnosis of a metastatic ER positive malignant of the right breast with bone metastasis.  She is managed by Dr. Jana Hakim and is treated with capecitabine and Xgeva.  She presents to the clinic today with episodic constipation.  She did have diarrhea episodically since Sunday and has had nausea and vomiting yesterday.  She reports having some dizziness.  She was seen at the GI clinic this morning and was told that she was hypotensive and told to present to the emergency room.  She declined this and contact EMS to came to her home and gave her Zofran and IV fluids.  She has a longstanding history of constipation and has been using oral medications for management of this along with Dulcolax suppositories.  She had a KUB completed this morning with results as follows:  FINDINGS: The bowel gas pattern is normal.  There is no evidence of free air. No radio-opaque calculi or other significant radiographic abnormality is seen.  IMPRESSION: Negative.   Medications: I have reviewed the patient's current medications.  Allergies:  Allergies  Allergen Reactions  . Demerol [Meperidine] Nausea And Vomiting  . Sulfa Antibiotics Nausea And Vomiting  . Tape Other (See Comments)    Takes off skin    Past Medical History:  Diagnosis Date  . Cancer ([place])   . Personal history of chemotherapy   . Personal history of radiation therapy     Past Surgical History:  Procedure Laterality Date  . BREAST LUMPECTOMY    . BREAST SURGERY      Family History  Problem Relation Age of Onset  . Hyperlipidemia Mother   . Heart failure Mother     Social History   Socioeconomic History  . Marital status: Single    Spouse name: Not on file  . Number of children: Not on file  . Years of education: Not on file  . Highest education level: Not on file  Occupational History  . Not on file  Tobacco Use  . Smoking status: Current Some Day Smoker  . Smokeless tobacco: Never Used   Vaping Use  . Vaping Use: Never used  Substance and Sexual Activity  . Alcohol use: Yes    Comment: on occasion  . Drug use: Never  . Sexual activity: Not Currently  Other Topics Concern  . Not on file  Social History Narrative  . Not on file   Social Determinants of Health   Financial Resource Strain: Not on file  Food Insecurity: Not on file  Transportation Needs: Not on file  Physical Activity: Not on file  Stress: Not on file  Social Connections: Not on file  Intimate Partner Violence: Not on file    Past Medical History, Surgical history, Social history, and Family history were reviewed and updated as appropriate.   Please see review of systems for further details on the patient's review from today.   Review of Systems:  Review of Systems  Constitutional: Negative for appetite change, chills, diaphoresis, fever and unexpected weight change.  HENT: Negative for trouble swallowing and voice change.   Respiratory: Negative for cough, chest tightness, shortness of breath and wheezing.   Cardiovascular: Negative for chest pain and palpitations.  Gastrointestinal: Positive for constipation, diarrhea, nausea and vomiting. Negative for abdominal distention, abdominal pain, anal bleeding, blood in stool and rectal pain.  Musculoskeletal: Negative for back pain and myalgias.  Neurological: Positive for dizziness. Negative for light-headedness and headaches.    Objective:   Physical Exam:  BP 112/65 (BP Location: Left Arm, Patient Position: Sitting)   Pulse 69   Temp (!) 97.3 F (36.3 C) (Tympanic)   Resp 16   Ht 5\' 5"  (1.651 m)   Wt 134 lb 1.6 oz (60.8 kg)   LMP  (LMP Unknown)   SpO2 100%   BMI 22.32 kg/m  ECOG: 1  Physical Exam Constitutional:      General: She is not in acute distress.    Appearance: She is not diaphoretic.  HENT:     Head: Normocephalic and atraumatic.  Eyes:     General: No scleral icterus.       Right eye: No discharge.        Left  eye: No discharge.     Conjunctiva/sclera: Conjunctivae normal.  Cardiovascular:     Rate and Rhythm: Normal rate and regular rhythm.     Heart sounds: Normal heart sounds. No murmur heard. No friction rub. No gallop.   Pulmonary:     Effort: Pulmonary effort is normal. No respiratory distress.     Breath sounds: Normal breath sounds. No wheezing or rales.  Abdominal:     General: Bowel sounds are normal. There is no distension.     Tenderness: There is no abdominal tenderness. There is no guarding.  Musculoskeletal:     Right lower leg: No edema.     Left lower leg: No edema.  Skin:    General: Skin is warm and dry.     Findings: No erythema or rash.  Neurological:     Mental Status: She  is alert.     Coordination: Coordination normal.     Gait: Gait normal.  Psychiatric:        Mood and Affect: Mood normal.        Behavior: Behavior normal.        Thought Content: Thought content normal.        Judgment: Judgment normal.     Lab Review:     Component Value Date/Time   NA 137 01/23/2021 1215   NA 140 09/02/2018 1106   K 3.9 01/23/2021 1215   CL 106 01/23/2021 1215   CO2 24 01/23/2021 1215   GLUCOSE 101 (H) 01/23/2021 1215   BUN 10 01/23/2021 1215   BUN 22 09/02/2018 1106   CREATININE 0.62 01/23/2021 1215   CALCIUM 8.2 (L) 01/23/2021 1215   PROT 7.2 01/23/2021 1215   ALBUMIN 3.5 01/23/2021 1215   AST 34 01/23/2021 1215   ALT 24 01/23/2021 1215   ALKPHOS 276 (H) 01/23/2021 1215   BILITOT 0.7 01/23/2021 1215   GFRNONAA >60 01/23/2021 1215   GFRAA >60 09/20/2020 1444       Component Value Date/Time   WBC 6.3 01/23/2021 1215   WBC 7.9 01/02/2021 1228   RBC 4.42 01/23/2021 1215   HGB 13.8 01/23/2021 1215   HGB 14.9 09/02/2018 1106   HCT 41.4 01/23/2021 1215   HCT 43.3 09/02/2018 1106   PLT 216 01/23/2021 1215   PLT 267 09/02/2018 1106   MCV 93.7 01/23/2021 1215   MCV 85 09/02/2018 1106   MCH 31.2 01/23/2021 1215   MCHC 33.3 01/23/2021 1215   RDW 14.2  01/23/2021 1215   RDW 13.0 09/02/2018 1106   LYMPHSABS 0.9 01/23/2021 1215   MONOABS 0.4 01/23/2021 1215   EOSABS 0.1 01/23/2021 1215   BASOSABS 0.0 01/23/2021 1215   -------------------------------  Imaging from last 24 hours (if applicable):  Radiology interpretation: DG Abd 2 Views  Result Date: 01/23/2021 CLINICAL DATA:  Evaluate for bowel obstruction. Diffuse abdominal pain. EXAM: ABDOMEN - 2 VIEW COMPARISON:  05/30/2020 FINDINGS: The bowel gas pattern is normal. There is no evidence of free air. No radio-opaque calculi or other significant radiographic abnormality is seen. IMPRESSION: Negative. Electronically Signed   By: Kerby Moors M.D.   On: 01/23/2021 12:16

## 2021-01-31 ENCOUNTER — Other Ambulatory Visit: Payer: Self-pay | Admitting: Oncology

## 2021-01-31 DIAGNOSIS — C50811 Malignant neoplasm of overlapping sites of right female breast: Secondary | ICD-10-CM

## 2021-01-31 DIAGNOSIS — G893 Neoplasm related pain (acute) (chronic): Secondary | ICD-10-CM

## 2021-01-31 DIAGNOSIS — Z170 Estrogen receptor positive status [ER+]: Secondary | ICD-10-CM

## 2021-01-31 DIAGNOSIS — C7951 Secondary malignant neoplasm of bone: Secondary | ICD-10-CM

## 2021-01-31 MED ORDER — MORPHINE SULFATE 15 MG PO TABS: 15.0000 mg | ORAL_TABLET | ORAL | 0 refills | Status: DC | PRN

## 2021-02-05 NOTE — Progress Notes (Signed)
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   Catherine Tyler stopped her capecitabine on 01/22/2020.  She was having significant problems with nausea, as well as epigastric discomfort which she attributed to the medication.  She was having some palmar plantar erythrodysesthesia as well.  We are following her tumor markers and alkaline phosphatase which have been increasing:  Lab Results  Component Value Date   ALKPHOS 344 (H) 02/06/2021   ALKPHOS 276 (H) 01/23/2021   ALKPHOS 227 (H) 01/02/2021   ALKPHOS 192 (H) 12/20/2020   ALKPHOS 135 (H) 12/05/2020   Lab Results  Component Value Date   CA2729 514.5 (H) 01/23/2021   CA2729 312.8 (H) 12/20/2020   CA2729 310.3 (H) 12/05/2020   CA2729 425.1 (H) 10/11/2020   CA2729 435.2 (H) 09/20/2020    She underwent left femur and bilateral hips x-rays at her last visit on 12/28/2020 for pain. Both were negative.  She also underwent abdomen x-ray on 01/23/2021 for diffuse abdominal pain. This was also negative.  REVIEW OF SYSTEMS: Catherine Tyler has 2 or 3 times as many "bad days" as good days.  On a good day she has more appetite and a little bit more energy.  She is able to go to the store for example.  She eats more.  On a bad day she has a lot of nausea and pain.  She lies around  trying to control these, going from bed to couch.  She has lost 15 pounds in the last 37months.  She describes herself as very weak and very nauseated.  Her pain is not well controlled.  She is taking methadone 10 mg every 8 hours and also MSIR 15 mg at least 4 or 5 times daily.  She was also taking gabapentin at 900 mg 3 times a day but that proved to be too much for her and made her drowsy.  She is cutting that down to 600 mg 3 times daily.  She is still having good bowel movements, usually every 3 days and soft.  She is having headaches associated with the right temporal skull area.  She is not having significant visual changes.  She thinks her balance is poor, but there have been no falls.   COVID 19 VACCINATION STATUS: Not yet vaccinated as of February 2022  HISTORY OF CURRENT ILLNESS: From the original intake note:  "Catherine Tyler" has a history of right breast cancer, diagnosed in 2007.  From her report, this appears to have been about 3 cm and to have involved 11 of the 13 right axillary lymph nodes involved.  She was treated at [facility] by Dr.  She was treated under Dr Epimenio Foot in [facility] with lumpectomy, chemotherapy (TAC x6) , and radiation therapy.  She then tried Femara which she did not tolerate and no further antiestrogens were prescribed.  She presented to the ED yesterday, 06/15/2020, with diffuse back, chest,  and abdominal pain, as well as unintentional weight loss. Chest x-ray performed at that time showed: concern for bone metastasis with pathologic fracture of left 5th rib.  She proceeded to chest, abdomen, pelvis CT that day, which showed: multifocal predominantly blastic-appearing osseous metastatic disease involving the thoracic and lumbar spine, multiple ribs most prominently at the left fifth through seventh ribs, and the right sacral ala; nonspecific mixed solid nodules and ill-defined ground-glass opacities in the left upper lobe measuring up to 7 mm and 6 mm in size;  mild distal small bowel fecalization without evidence of obstruction.  Of note, her most recent mammogram on file is a bilateral diagnostic scan from 08/11/2018. At that time, she presented with a burning sensation to the inferior right breast. Mammogram showed: breast density category B; no mammographic evidence of malignancy in the bilateral breasts.   The patient's subsequent history is as detailed below.   PAST MEDICAL HISTORY: Past Medical History:  Diagnosis Date  . Cancer (Daniel)   . Personal history of chemotherapy   . Personal history of radiation therapy     PAST SURGICAL HISTORY: Past Surgical History:  Procedure Laterality Date  . BREAST LUMPECTOMY    . BREAST SURGERY      FAMILY HISTORY: Family History  Problem Relation Age of Onset  . Hyperlipidemia Mother   . Heart failure Mother   The patient's father died at age 86 from a myocardial infarction.  As far as the patient knows there is no history of cancer on that side of the family.  The patient's mother died at age 59 from renal failure.  The patient is not aware of any cancer on that side of the family.  The patient herself has 5 sisters, no brothers.  1 sister Catherine Tyler) had lung cancer.   GYNECOLOGIC HISTORY:  No LMP recorded (lmp unknown). Patient has had a hysterectomy. Menarche: 65 years old Age at first live birth: 65 years old [place] P 1 LMP status post hysterectomy at age 65 secondary to fibroids; status post remote conization for in situ cervical carcinoma BSO?    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  She lives by herself with her dog Catherine Tyler who is a rescue.  The patient has been divorced 13 years as of June 2021.  Her son Catherine Tyler lives in [place] where he works in Architect.  The patient has 1 grandchild.  She attends [employer].   ADVANCED DIRECTIVES: To be discussed   HEALTH MAINTENANCE: Social History   Tobacco  Use  . Smoking status: Current Some Day Smoker  . Smokeless tobacco: Never Used  Vaping Use  . Vaping Use: Never used  Substance Use Topics  . Alcohol use: Yes    Comment: on occasion  . Drug use: Never     Colonoscopy: Never  PAP: 07/2018, negative  Bone density: Never   Allergies  Allergen Reactions  . Demerol [Meperidine] Nausea And Vomiting  . Sulfa Antibiotics Nausea And Vomiting  . Tape Other (See Comments)    Takes off skin    Current Outpatient Medications  Medication Sig Dispense Refill  . dexamethasone (DECADRON) 4 MG tablet Take 1 tablet (4 mg total) by mouth in the morning. 30 tablet 6  . fluconazole (DIFLUCAN) 100 MG tablet Take 1 tablet (100 mg total) by mouth daily. 30 tablet 3  . methadone (DOLOPHINE) 10 MG tablet Take 1.5 tablets (15  mg total) by mouth every 8 (eight) hours. 140 tablet 0  . acetaminophen (TYLENOL) 500 MG tablet Take 1 tablet (500 mg total) by mouth with breakfast, with lunch, and with evening meal. Take together with aleve 220 mg 30 tablet 0  . bisacodyl (DULCOLAX) 10 MG suppository Place 1 suppository (10 mg total) rectally as needed for moderate constipation. 12 suppository 0  . [employer] MAGNESIUM CITRATE 1.745 GM/30ML SOLN Take 296 mLs by mouth once.    . gabapentin (NEURONTIN) 300 MG capsule 2 capsules (600 mg total) 3 (three) times daily. Gabapentin 364m 3 tablets PO QID #360 with no refills 360 capsule 0  . lisinopril (ZESTRIL) 10 MG tablet Take 1 tablet (10 mg total) by mouth daily. 30 tablet 11  . methadone (DOLOPHINE) 10 MG tablet Take 1 tablet (10 mg total) by mouth every 8 (eight) hours. 90 tablet 0  . morphine (MSIR) 15 MG tablet Take 1 tablet (15 mg total) by mouth every 4 (four) hours as needed for severe pain. 180 tablet 0  . omeprazole (PRILOSEC) 40 MG capsule Take 1 capsule (40 mg total) by mouth daily. 90 capsule 3  . ondansetron (ZOFRAN ODT) 4 MG disintegrating tablet Take 1 tablet (4 mg total) by mouth every 8 (eight) hours as  needed for nausea or vomiting. 20 tablet 0  . polyethylene glycol (MIRALAX / GLYCOLAX) 17 g packet Take 17 g by mouth daily.    . prochlorperazine (COMPAZINE) 10 MG tablet Take 1 tablet (10 mg total) by mouth every 6 (six) hours as needed for nausea or vomiting. 30 tablet 3  . promethazine (PHENERGAN) 25 MG suppository Place 1 suppository (25 mg total) rectally every 6 (six) hours as needed for nausea or vomiting. 12 each 2  . senna-docusate (SENOKOT-S) 8.6-50 MG tablet Take 2 tablets by mouth 2 (two) times daily. 120 tablet 0  . sodium phosphate (FLEET) 7-19 GM/118ML ENEM Place 133 mLs (1 enema total) rectally daily as needed for severe constipation. 399 mL 0   No current facility-administered medications for this visit.    OBJECTIVE: White woman who appears stated age  V100   02/06/21 1315  BP: 111/66  Pulse: 68  Resp: 17  Temp: (!) 97.5 F (36.4 C)  SpO2: 97%   Wt Readings from Last 3 Encounters:  02/06/21 128 lb 3.2 oz (58.2 kg)  01/23/21 134 lb 1.6 oz (60.8 kg)  12/20/20 143 lb (64.9 kg)   Body mass index is 21.33 kg/m.    ECOG FS:2 - Symptomatic, <50% confined to bed  Sclerae unicteric, EOMs intact Wearing a mask Lungs no rales or rhonchi Heart regular rate and rhythm Abd soft, nontender, positive bowel sounds MSK no focal spinal tenderness, no upper extremity lymphedema Neuro: nonfocal, well oriented, appropriate affect Breasts: Deferred   LAB RESULTS:  CMP     Component Value Date/Time   NA 134 (L) 02/06/2021 1226   NA 140 09/02/2018 1106   K 4.0 02/06/2021 1226   CL 103 02/06/2021 1226   CO2 25 02/06/2021 1226   GLUCOSE 126 (H) 02/06/2021 1226   BUN 14 02/06/2021 1226   BUN 22 09/02/2018 1106   CREATININE 0.69 02/06/2021 1226   CREATININE 0.62 01/23/2021 1215   CALCIUM 8.5 (L) 02/06/2021 1226   PROT 6.9 02/06/2021 1226   ALBUMIN 3.5 02/06/2021 1226   AST 30 02/06/2021 1226   AST 34 01/23/2021 1215   ALT 22 02/06/2021 1226   ALT 24 01/23/2021  1215   ALKPHOS  344 (H) 02/06/2021 1226   BILITOT 0.6 02/06/2021 1226   BILITOT 0.7 01/23/2021 1215   GFRNONAA >60 02/06/2021 1226   GFRNONAA >60 01/23/2021 1215   GFRAA >60 09/20/2020 1444    No results found for: TOTALPROTELP, ALBUMINELP, A1GS, A2GS, BETS, BETA2SER, GAMS, MSPIKE, SPEI  Lab Results  Component Value Date   WBC 5.9 02/06/2021   NEUTROABS 4.1 02/06/2021   HGB 13.6 02/06/2021   HCT 40.7 02/06/2021   MCV 92.7 02/06/2021   PLT 195 02/06/2021    No results found for: LABCA2  No components found for: [id number]  No results for input(s): INR in the last 168 hours.  No results found for: LABCA2  No results found for: CAN199  No results found for: CAN125  No results found for: NTI144  Lab Results  Component Value Date   CA2729 514.5 (H) 01/23/2021    No components found for: HGQUANT  Lab Results  Component Value Date   CEA1 2,345.62 (H) 01/23/2021   /  CEA ([facility])  Date Value Ref Range Status  01/23/2021 2,345.62 (H) 0.00 - 5.00 ng/mL Final    Comment:    (NOTE) This test was performed using Architect's Chemiluminescent Microparticle Immunoassay. Values obtained from different assay methods cannot be used interchangeably. Please note that 5-10% of patients who smoke may see CEA levels up to 6.9 ng/mL. Performed at [facility], [place] [address], [place], [place] [address]      No results found for: AFPTUMOR  No results found for: CHROMOGRNA  No results found for: KPAFRELGTCHN, LAMBDASER, KAPLAMBRATIO (kappa/lambda light chains)  No results found for: HGBA, HGBA2QUANT, HGBFQUANT, HGBSQUAN (Hemoglobinopathy evaluation)   No results found for: LDH  No results found for: IRON, TIBC, IRONPCTSAT (Iron and TIBC)  No results found for: FERRITIN  Urinalysis    Component Value Date/Time   COLORURINE STRAW (A) 04/27/2020 0928   APPEARANCEUR CLEAR 04/27/2020 0928   LABSPEC 1.015 05/30/2020 1402   PHURINE 6.0  05/30/2020 1402   GLUCOSEU NEGATIVE 05/30/2020 1402   HGBUR NEGATIVE 05/30/2020 1402   BILIRUBINUR NEGATIVE 05/30/2020 1402   KETONESUR NEGATIVE 05/30/2020 1402   PROTEINUR NEGATIVE 05/30/2020 1402   UROBILINOGEN 0.2 05/30/2020 1402   NITRITE NEGATIVE 05/30/2020 1402   LEUKOCYTESUR NEGATIVE 05/30/2020 1402    STUDIES: DG Abd 2 Views  Result Date: 01/23/2021 CLINICAL DATA:  Evaluate for bowel obstruction. Diffuse abdominal pain. EXAM: ABDOMEN - 2 VIEW COMPARISON:  05/30/2020 FINDINGS: The bowel gas pattern is normal. There is no evidence of free air. No radio-opaque calculi or other significant radiographic abnormality is seen. IMPRESSION: Negative. Electronically Signed   By: Kerby Moors M.D.   On: 01/23/2021 12:16     ELIGIBLE FOR AVAILABLE RESEARCH PROTOCOL: no  ASSESSMENT: 65 y.o. [place] woman   (1) status post right lumpectomy and axillary lymph node dissection in 2007 for a stage III invasive breast cancer, additional pathology details to be reviewed when available  (a) a total of 13 axillary lymph nodes were removed, 11 positive  (b) adjuvant chemotherapy consisted of docetaxel, doxorubicin and cyclophosphamide x6  (c) the patient received adjuvant radiation  (d) received letrozole briefly but did not tolerate it.  METASTATIC DISEASE: June 2021 (2) CT scans of the chest abdomen and pelvis 06/15/2020 shows widespread bony metastatic disease, no definitive visceral disease  (a) bone marrow biopsy  06/20/2020 nondiagnostic  (b) F 18 estradiol PET scan (cerianna) 07/31/2020 shows no estrogen uptake in the known bone lesions or  elsewhere  (3) Capecitabine started on 08/24/2020 at 1500 mg twice daily, on radiation days only--with moderate palmar plantar erythrodysesthesia  (a) Xgeva started on 07/13/2020 given every 4 weeks  (b) capecitabine resumed 11/30/2020 at metronomic doses (1 g twice daily, with no breaks)  (c) discontinued as of 01/21/2021 with multiple side effects  and a rising tumor marker  (4) palliative radiation 10/23/20 - 11/10/20 Site/dose:    1.  The left chest was treated to a dose of 37.5 Gy in 15 fractios using a 5-field 3D conformal technique. 2.  The right sacrum was treated to a dose of 37.5 Gy in 15 fractios using a 4-field isodose technique.  (5) pain management:  (a) started methadone 5 mg every 8 hours 11/30/2020, dose gradually increased to 15 mg every 8 hours starting 02/06/2021  (b) also on MSIR 15 mg as needed for breakthrough pain  (c) bowel prophylaxis: MiraLAX daily, stool softeners 2 tablets twice daily, prune juice and milk of magnesia  (6) cyclophosphamide, methotrexate, fluorouracil [CMF] chemotherapy to start 02/13/2021  PLAN: Things are not going the right way with NG.  She has lost 15 pounds in the last 2 months.  The tumor markers are going up.  She has fairly constant nausea problems, and has at least twice as many bad days as good days.  We need to try to turn this around.  First of all I do not think the capecitabine is working in addition to her not tolerating it well.  The tumor markers have jumped significantly and she has more pain and a worse quality of life.  Status stopped.  Today we discussed CMF chemotherapy and she has a good understanding of the possible toxicity side effects and complications of these agents.  Initially she will receive it by vein but if she decides she wants a port we can easily arrange for that  Her pain is still not were controlled and she uses the morphine breakthrough currently approximately 5 times a day.  We are increasing the methadone to $RemoveBefo'15mg'YynJkFjaBAR$  every 8 hours as of today.  I am hopeful that with this her pain will be better controlled but if not we will continue to raise the dose every 2-week intervals as needed  I am starting her on dexamethasone 4 mg every morning hoping to improve her appetite, feeling of energy, and general quality of life.  I also wrote her for Diflucan in case  she develops thrush.  She knows how to use that.  It may be that the nausea she is experiencing is due to narcotics.  On the other hand it could be due to central nervous system involvement by the tumor.  I think a brain MRI would be useful at this point and I have written for that.  I have also written for a bone scan which will serve as our new baseline study.  Of course we will also continue to follow the tumor markers  She did not want to receive Xgeva today.  We are postponing that for now  Total encounter time 65 minutes.Sarajane Jews C. Magrinat, MD 02/06/21 2:06 PM Medical Oncology and Hematology [facility] [place], [name] [address] Tel. [phone]    Fax. [phone]   I, Wilburn Mylar, am acting as scribe for Dr. Virgie Tyler. Magrinat.  I, Lurline Del MD, have reviewed the above documentation for accuracy and completeness, and I agree with the above.   *Total Encounter Time  as defined by the Centers for Medicare and Medicaid Services includes, in addition to the face-to-face time of a patient visit (documented in the note above) non-face-to-face time: obtaining and reviewing outside history, ordering and reviewing medications, tests or procedures, care coordination (communications with other health care professionals or caregivers) and documentation in the medical record.

## 2021-02-06 ENCOUNTER — Other Ambulatory Visit: Payer: Self-pay

## 2021-02-06 ENCOUNTER — Inpatient Hospital Stay: Payer: Medicaid Other | Attending: Oncology

## 2021-02-06 ENCOUNTER — Inpatient Hospital Stay (HOSPITAL_BASED_OUTPATIENT_CLINIC_OR_DEPARTMENT_OTHER): Payer: Medicaid Other | Admitting: Oncology

## 2021-02-06 ENCOUNTER — Inpatient Hospital Stay: Payer: Medicaid Other

## 2021-02-06 VITALS — BP 111/66 | HR 68 | Temp 97.5°F | Resp 17 | Ht 65.0 in | Wt 128.2 lb

## 2021-02-06 DIAGNOSIS — G893 Neoplasm related pain (acute) (chronic): Secondary | ICD-10-CM

## 2021-02-06 DIAGNOSIS — R978 Other abnormal tumor markers: Secondary | ICD-10-CM | POA: Insufficient documentation

## 2021-02-06 DIAGNOSIS — R634 Abnormal weight loss: Secondary | ICD-10-CM | POA: Insufficient documentation

## 2021-02-06 DIAGNOSIS — C50811 Malignant neoplasm of overlapping sites of right female breast: Secondary | ICD-10-CM | POA: Diagnosis present

## 2021-02-06 DIAGNOSIS — C7951 Secondary malignant neoplasm of bone: Secondary | ICD-10-CM | POA: Insufficient documentation

## 2021-02-06 DIAGNOSIS — R110 Nausea: Secondary | ICD-10-CM | POA: Insufficient documentation

## 2021-02-06 DIAGNOSIS — Z923 Personal history of irradiation: Secondary | ICD-10-CM | POA: Diagnosis not present

## 2021-02-06 DIAGNOSIS — R52 Pain, unspecified: Secondary | ICD-10-CM | POA: Insufficient documentation

## 2021-02-06 DIAGNOSIS — Z7189 Other specified counseling: Secondary | ICD-10-CM

## 2021-02-06 DIAGNOSIS — Z66 Do not resuscitate: Secondary | ICD-10-CM | POA: Insufficient documentation

## 2021-02-06 DIAGNOSIS — F17200 Nicotine dependence, unspecified, uncomplicated: Secondary | ICD-10-CM | POA: Diagnosis not present

## 2021-02-06 DIAGNOSIS — C50911 Malignant neoplasm of unspecified site of right female breast: Secondary | ICD-10-CM

## 2021-02-06 DIAGNOSIS — Z170 Estrogen receptor positive status [ER+]: Secondary | ICD-10-CM

## 2021-02-06 LAB — COMPREHENSIVE METABOLIC PANEL
ALT: 22 U/L (ref 0–44)
AST: 30 U/L (ref 15–41)
Albumin: 3.5 g/dL (ref 3.5–5.0)
Alkaline Phosphatase: 344 U/L — ABNORMAL HIGH (ref 38–126)
Anion gap: 6 (ref 5–15)
BUN: 14 mg/dL (ref 8–23)
CO2: 25 mmol/L (ref 22–32)
Calcium: 8.5 mg/dL — ABNORMAL LOW (ref 8.9–10.3)
Chloride: 103 mmol/L (ref 98–111)
Creatinine, Ser: 0.69 mg/dL (ref 0.44–1.00)
GFR, Estimated: >60 mL/min (ref >60)
Glucose, Bld: 126 mg/dL — ABNORMAL HIGH (ref 70–99)
Potassium: 4 mmol/L (ref 3.5–5.1)
Sodium: 134 mmol/L — ABNORMAL LOW (ref 135–145)
Total Bilirubin: 0.6 mg/dL (ref 0.3–1.2)
Total Protein: 6.9 g/dL (ref 6.5–8.1)

## 2021-02-06 LAB — CBC WITH DIFFERENTIAL/PLATELET
Abs Immature Granulocytes: 0.04 10*3/uL (ref 0.00–0.07)
Basophils Absolute: 0 10*3/uL (ref 0.0–0.1)
Basophils Relative: 1 %
Eosinophils Absolute: 0.2 10*3/uL (ref 0.0–0.5)
Eosinophils Relative: 3 %
HCT: 40.7 % (ref 36.0–46.0)
Hemoglobin: 13.6 g/dL (ref 12.0–15.0)
Immature Granulocytes: 1 %
Lymphocytes Relative: 19 %
Lymphs Abs: 1.1 10*3/uL (ref 0.7–4.0)
MCH: 31 pg (ref 26.0–34.0)
MCHC: 33.4 g/dL (ref 30.0–36.0)
MCV: 92.7 fL (ref 80.0–100.0)
Monocytes Absolute: 0.4 10*3/uL (ref 0.1–1.0)
Monocytes Relative: 7 %
Neutro Abs: 4.1 10*3/uL (ref 1.7–7.7)
Neutrophils Relative %: 69 %
Platelets: 195 10*3/uL (ref 150–400)
RBC: 4.39 MIL/uL (ref 3.87–5.11)
RDW: 13.9 % (ref 11.5–15.5)
WBC: 5.9 10*3/uL (ref 4.0–10.5)
nRBC: 0 % (ref 0.0–0.2)

## 2021-02-06 MED ORDER — FLUCONAZOLE 100 MG PO TABS: 100.0000 mg | ORAL_TABLET | Freq: Every day | ORAL | 3 refills | Status: AC

## 2021-02-06 MED ORDER — DEXAMETHASONE 4 MG PO TABS: 4.0000 mg | ORAL_TABLET | Freq: Every morning | ORAL | 6 refills | Status: AC

## 2021-02-06 MED ORDER — METHADONE HCL 10 MG PO TABS
15.0000 mg | ORAL_TABLET | Freq: Three times a day (TID) | ORAL | 0 refills | Status: DC
Start: 2021-02-06 — End: 2021-03-12

## 2021-02-06 NOTE — Progress Notes (Signed)
START OFF PATHWAY REGIMEN - Breast   [id number]:CMF (IV cyclophosphamide) q21 days:   A cycle is every 21 days:     Cyclophosphamide      Methotrexate      Fluorouracil   **Always confirm dose/schedule in your pharmacy ordering system**  Patient Characteristics: Distant Metastases or Locoregional Recurrent Disease - Unresected or Locally Advanced Unresectable Disease Progressing after Neoadjuvant and Local Therapies, HER2 Negative/Unknown/Equivocal, ER Negative/Unknown, Chemotherapy, Second Line Therapeutic Status: Distant Metastases ER Status: Negative (-) HER2 Status: Negative (-) PR Status: Negative (-) Therapy Approach Indicated: Standard Chemotherapy/Endocrine Therapy Line of Therapy: Second Line Intent of Therapy: Non-Curative / Palliative Intent, Discussed with Patient

## 2021-02-07 ENCOUNTER — Observation Stay (HOSPITAL_COMMUNITY)
Admission: EM | Admit: 2021-02-07 | Discharge: 2021-02-09 | Disposition: A | Discharge status: Home / Self Care | Payer: Medicaid Other | Attending: Internal Medicine | Admitting: Internal Medicine

## 2021-02-07 ENCOUNTER — Other Ambulatory Visit: Payer: Self-pay

## 2021-02-07 ENCOUNTER — Inpatient Hospital Stay: Payer: Medicaid Other

## 2021-02-07 ENCOUNTER — Inpatient Hospital Stay (HOSPITAL_BASED_OUTPATIENT_CLINIC_OR_DEPARTMENT_OTHER): Payer: Medicaid Other | Admitting: Medical

## 2021-02-07 ENCOUNTER — Telehealth: Payer: Self-pay | Admitting: Oncology

## 2021-02-07 VITALS — BP 156/91 | HR 71 | Temp 98.0°F | Resp 16 | Ht 65.0 in | Wt 124.8 lb

## 2021-02-07 DIAGNOSIS — R112 Nausea with vomiting, unspecified: Principal | ICD-10-CM | POA: Insufficient documentation

## 2021-02-07 DIAGNOSIS — F17200 Nicotine dependence, unspecified, uncomplicated: Secondary | ICD-10-CM | POA: Insufficient documentation

## 2021-02-07 DIAGNOSIS — C7951 Secondary malignant neoplasm of bone: Secondary | ICD-10-CM | POA: Diagnosis not present

## 2021-02-07 DIAGNOSIS — Z853 Personal history of malignant neoplasm of breast: Secondary | ICD-10-CM | POA: Diagnosis not present

## 2021-02-07 DIAGNOSIS — C50811 Malignant neoplasm of overlapping sites of right female breast: Secondary | ICD-10-CM

## 2021-02-07 DIAGNOSIS — Z170 Estrogen receptor positive status [ER+]: Secondary | ICD-10-CM | POA: Diagnosis not present

## 2021-02-07 DIAGNOSIS — I10 Essential (primary) hypertension: Secondary | ICD-10-CM | POA: Insufficient documentation

## 2021-02-07 DIAGNOSIS — Z85830 Personal history of malignant neoplasm of bone: Secondary | ICD-10-CM | POA: Diagnosis not present

## 2021-02-07 DIAGNOSIS — G893 Neoplasm related pain (acute) (chronic): Secondary | ICD-10-CM | POA: Diagnosis not present

## 2021-02-07 DIAGNOSIS — Z20822 Contact with and (suspected) exposure to covid-19: Secondary | ICD-10-CM | POA: Insufficient documentation

## 2021-02-07 LAB — CMP (CANCER CENTER ONLY)
ALT: 19 U/L (ref 0–44)
AST: 29 U/L (ref 15–41)
Albumin: 3.7 g/dL (ref 3.5–5.0)
Alkaline Phosphatase: 391 U/L — ABNORMAL HIGH (ref 38–126)
Anion gap: 7 (ref 5–15)
BUN: 10 mg/dL (ref 8–23)
CO2: 23 mmol/L (ref 22–32)
Calcium: 8.5 mg/dL — ABNORMAL LOW (ref 8.9–10.3)
Chloride: 105 mmol/L (ref 98–111)
Creatinine: 0.58 mg/dL (ref 0.44–1.00)
GFR, Estimated: >60 mL/min (ref >60)
Glucose, Bld: 120 mg/dL — ABNORMAL HIGH (ref 70–99)
Potassium: 4.2 mmol/L (ref 3.5–5.1)
Sodium: 135 mmol/L (ref 135–145)
Total Bilirubin: 0.8 mg/dL (ref 0.3–1.2)
Total Protein: 7.4 g/dL (ref 6.5–8.1)

## 2021-02-07 LAB — CBC WITH DIFFERENTIAL (CANCER CENTER ONLY)
Abs Immature Granulocytes: 0.03 10*3/uL (ref 0.00–0.07)
Basophils Absolute: 0 10*3/uL (ref 0.0–0.1)
Basophils Relative: 0 %
Eosinophils Absolute: 0 10*3/uL (ref 0.0–0.5)
Eosinophils Relative: 0 %
HCT: 41.6 % (ref 36.0–46.0)
Hemoglobin: 14.4 g/dL (ref 12.0–15.0)
Immature Granulocytes: 1 %
Lymphocytes Relative: 13 %
Lymphs Abs: 0.8 10*3/uL (ref 0.7–4.0)
MCH: 31 pg (ref 26.0–34.0)
MCHC: 34.6 g/dL (ref 30.0–36.0)
MCV: 89.5 fL (ref 80.0–100.0)
Monocytes Absolute: 0.2 10*3/uL (ref 0.1–1.0)
Monocytes Relative: 4 %
Neutro Abs: 4.7 10*3/uL (ref 1.7–7.7)
Neutrophils Relative %: 82 %
Platelet Count: 210 10*3/uL (ref 150–400)
RBC: 4.65 MIL/uL (ref 3.87–5.11)
RDW: 13.5 % (ref 11.5–15.5)
WBC Count: 5.8 10*3/uL (ref 4.0–10.5)
nRBC: 0 % (ref 0.0–0.2)

## 2021-02-07 LAB — RESP PANEL BY RT-PCR (FLU A&B, COVID) ARPGX2
Influenza A by PCR: NEGATIVE
Influenza B by PCR: NEGATIVE
SARS Coronavirus 2 by RT PCR: NEGATIVE

## 2021-02-07 LAB — LIPASE, BLOOD: Lipase: 23 U/L (ref 11–51)

## 2021-02-07 MED ORDER — ENOXAPARIN SODIUM 40 MG/0.4ML ~~LOC~~ SOLN
40.0000 mg | SUBCUTANEOUS | Status: DC
  Administered 2021-02-07 – 2021-02-08 (×2): 40 mg via SUBCUTANEOUS
  Filled 2021-02-07 (×2): qty 0.4

## 2021-02-07 MED ORDER — ONDANSETRON HCL 4 MG/2ML IJ SOLN
8.0000 mg | Freq: Once | INTRAMUSCULAR | Status: AC
  Administered 2021-02-07: 8 mg via INTRAVENOUS

## 2021-02-07 MED ORDER — SODIUM CHLORIDE 0.9 % IV SOLN
INTRAVENOUS | Status: DC
  Filled 2021-02-07 (×2): qty 250

## 2021-02-07 MED ORDER — SODIUM CHLORIDE 0.9 % IV SOLN
10.0000 mg | Freq: Once | INTRAVENOUS | Status: AC
  Administered 2021-02-07: 10 mg via INTRAVENOUS
  Filled 2021-02-07: qty 10

## 2021-02-07 MED ORDER — METOCLOPRAMIDE HCL 5 MG/ML IJ SOLN
10.0000 mg | Freq: Once | INTRAMUSCULAR | Status: AC
  Administered 2021-02-07: 10 mg via INTRAVENOUS
  Filled 2021-02-07: qty 2

## 2021-02-07 MED ORDER — MORPHINE SULFATE (PF) 4 MG/ML IV SOLN
4.0000 mg | Freq: Once | INTRAVENOUS | Status: AC
  Administered 2021-02-07: 4 mg via INTRAVENOUS
  Filled 2021-02-07: qty 1

## 2021-02-07 MED ORDER — PROCHLORPERAZINE EDISYLATE 10 MG/2ML IJ SOLN: 10.0000 mg | INTRAMUSCULAR | Status: DC | PRN

## 2021-02-07 MED ORDER — LORAZEPAM 2 MG/ML IJ SOLN
0.5000 mg | Freq: Once | INTRAMUSCULAR | Status: AC
  Administered 2021-02-07: 0.5 mg via INTRAVENOUS
  Filled 2021-02-07: qty 1

## 2021-02-07 MED ORDER — ONDANSETRON HCL 4 MG/2ML IJ SOLN
INTRAMUSCULAR | Status: AC
  Filled 2021-02-07: qty 4

## 2021-02-07 MED ORDER — ONDANSETRON HCL 4 MG/2ML IJ SOLN: 4.0000 mg | Freq: Four times a day (QID) | INTRAMUSCULAR | Status: DC | PRN

## 2021-02-07 NOTE — Telephone Encounter (Signed)
Scheduled appts per 2/8 los. Pt stated she would get print out when she arrives for her 2/9 appts.

## 2021-02-07 NOTE — H&P (Signed)
History and Physical    Catherine Tyler WJX:[id number] DOB: 1956/03/29 DOA: 02/07/2021  PCP: Patient, No Pcp Per  Patient coming from: Home, sister at bedside  I have personally briefly reviewed patient's old medical records in [place]  Chief Complaint: Persistent nausea and vomiting  HPI: Catherine Tyler is a 65 y.o. female with medical history significant for metastatic breast cancer to bone, hypertension who presents with concerns of intractable nausea and vomiting.  She reports ongoing nausea and vomiting for at least 6 weeks. She thought initially could be due to her heavy opioid use for her cancer pain and attempted to taper down but this made her symptoms much worse. Her vomitus is nonbloody but biliary at times. She also notes upper epigastric  crampy abdominal pain and some significant "gurgling" during her nausea episodes. She has issues with severe constipation and her stool is chronically loose since she is on several stool softeners. She denies any fever. She attempted to treat with Compazine, Zofran and suppository Phenergan at home with no significant relief. Last chemotherapy session was on January 22. Around that time she was evaluated in the ED for similar symptoms. She was evaluated by GI and supposedly had an abdominal x-ray that did not show obstruction.  She presented to oncology outpatient today with the symptoms and was sent for concerns of potential withdrawal since she has not been able to take her high-dose gabapentin at home.  She was given 8 mg IV Zofran, 10 mg of IV Decadron and was started on 1 L of normal saline prior to her transfer to the ER.  ED Course: She was afebrile, hypertensive up to 180s over 86 which improved with intervention.  No leukocytosis. Normal creatinine.   Review of Systems: Constitutional: No Weight Change, No Fever ENT/Mouth: No sore throat, No Rhinorrhea Eyes: No Eye Pain, No Vision Changes Cardiovascular: No Chest Pain, no SOB, No  PND, No Dyspnea on Exertion, No Orthopnea, No Edema, No Palpitations Respiratory: No Cough, No Sputum, No Wheezing, no Dyspnea  Gastrointestinal: No Nausea, No Vomiting, No Diarrhea, No Constipation, No Pain Genitourinary: no Urinary Incontinence, No Urgency, No Flank Pain Musculoskeletal: No Arthralgias, No Myalgias Skin: No Skin Lesions, No Pruritus, Neuro: no Weakness, No Numbness,  No Loss of Consciousness, No Syncope Psych: No Anxiety/Panic, No Depression, no decrease appetite Heme/Lymph: No Bruising, No Bleeding  Past Medical History:  Diagnosis Date  . Cancer ([place])   . Personal history of chemotherapy   . Personal history of radiation therapy     Past Surgical History:  Procedure Laterality Date  . BREAST LUMPECTOMY    . BREAST SURGERY       reports that she has been smoking. She has never used smokeless tobacco. She reports current alcohol use. She reports that she does not use drugs. Social History  Allergies  Allergen Reactions  . Demerol [Meperidine] Nausea And Vomiting  . Sulfa Antibiotics Nausea And Vomiting  . Tape Other (See Comments)    Takes off skin    Family History  Problem Relation Age of Onset  . Hyperlipidemia Mother   . Heart failure Mother      Prior to Admission medications   Medication Sig Start Date End Date Taking? Authorizing Provider  bisacodyl (DULCOLAX) 10 MG suppository Place 1 suppository (10 mg total) rectally as needed for moderate constipation. 09/20/20  Yes Causey, Charlestine Massed, NP  gabapentin (NEURONTIN) 300 MG capsule Take 600 mg by mouth 3 (three) times daily. 02/06/21  Yes  Magrinat, Virgie Dad, MD  lisinopril (ZESTRIL) 10 MG tablet Take 1 tablet (10 mg total) by mouth daily. 07/11/20 07/11/21 Yes Bloomfield, Carley D, DO  methadone (DOLOPHINE) 10 MG tablet Take 1 tablet (10 mg total) by mouth every 8 (eight) hours. 01/08/21  Yes Magrinat, Virgie Dad, MD  morphine (MSIR) 15 MG tablet Take 1 tablet (15 mg total) by mouth every 4 (four)  hours as needed for severe pain. 01/31/21  Yes Magrinat, Virgie Dad, MD  omeprazole (PRILOSEC) 40 MG capsule Take 1 capsule (40 mg total) by mouth daily. 08/17/20  Yes Causey, Charlestine Massed, NP  ondansetron (ZOFRAN ODT) 4 MG disintegrating tablet Take 1 tablet (4 mg total) by mouth every 8 (eight) hours as needed for nausea or vomiting. 04/27/20  Yes Deno Etienne, DO  polyethylene glycol (MIRALAX / GLYCOLAX) 17 g packet Take 17 g by mouth daily.   Yes [provider]  prochlorperazine (COMPAZINE) 10 MG tablet Take 1 tablet (10 mg total) by mouth every 6 (six) hours as needed for nausea or vomiting. 01/23/21  Yes Tanner, Lyndon Code., PA-C  promethazine (PHENERGAN) 25 MG suppository Place 1 suppository (25 mg total) rectally every 6 (six) hours as needed for nausea or vomiting. 01/23/21  Yes Tanner, Lyndon Code., PA-C  senna-docusate (SENOKOT-S) 8.6-50 MG tablet Take 2 tablets by mouth 2 (two) times daily. 09/20/20  Yes Causey, Charlestine Massed, NP  acetaminophen (TYLENOL) 500 MG tablet Take 1 tablet (500 mg total) by mouth with breakfast, with lunch, and with evening meal. Take together with aleve 220 mg Patient not taking: No sig reported 06/16/20   Magrinat, Virgie Dad, MD  dexamethasone (DECADRON) 4 MG tablet Take 1 tablet (4 mg total) by mouth in the morning. 02/06/21   Magrinat, Virgie Dad, MD  fluconazole (DIFLUCAN) 100 MG tablet Take 1 tablet (100 mg total) by mouth daily. 02/06/21   Magrinat, Virgie Dad, MD  methadone (DOLOPHINE) 10 MG tablet Take 1.5 tablets (15 mg total) by mouth every 8 (eight) hours. 02/06/21   Magrinat, Virgie Dad, MD  sodium phosphate (FLEET) 7-19 GM/118ML ENEM Place 133 mLs (1 enema total) rectally daily as needed for severe constipation. 09/20/20   Gardenia Phlegm, NP    Physical Exam: Vitals:   02/07/21 1824 02/07/21 1915  BP: (!) 182/86 (!) 134/106  Pulse: 71 71  Resp: 13 14  Temp: 98.2 F (36.8 C)   TempSrc: Oral   SpO2: 97% 99%    Constitutional: NAD, calm, comfortable,  elderly nontoxic appearing female laying flat in bed Vitals:   02/07/21 1824 02/07/21 1915  BP: (!) 182/86 (!) 134/106  Pulse: 71 71  Resp: 13 14  Temp: 98.2 F (36.8 C)   TempSrc: Oral   SpO2: 97% 99%   Eyes: PERRL, lids and conjunctivae normal ENMT: Mucous membranes are moist.  Neck: normal, supple, Respiratory: clear to auscultation bilaterally, no wheezing, no crackles. Normal respiratory effort. No accessory muscle use.  Cardiovascular: Regular rate and rhythm, no murmurs / rubs / gallops. No extremity edema.  Abdomen: no tenderness, no masses palpated. Bowel sounds positive.  Musculoskeletal: no clubbing / cyanosis. No joint deformity upper and lower extremities. Normal muscle tone.  Skin: no rashes, lesions, ulcers. No induration Neurologic: CN 2-12 grossly intact. Sensation intact. Strength 5/5 in all 4.  Psychiatric: Normal judgment and insight. Alert and oriented x 3. Normal mood.    Labs on Admission: I have personally reviewed following labs and imaging studies  CBC: Recent Labs  Lab  02/06/21 1226 02/07/21 1458  WBC 5.9 5.8  NEUTROABS 4.1 4.7  HGB 13.6 14.4  HCT 40.7 41.6  MCV 92.7 89.5  PLT 195 347   Basic Metabolic Panel: Recent Labs  Lab 02/06/21 1226 02/07/21 1458  NA 134* 135  K 4.0 4.2  CL 103 105  CO2 25 23  GLUCOSE 126* 120*  BUN 14 10  CREATININE 0.69 0.58  CALCIUM 8.5* 8.5*   GFR: Estimated Creatinine Clearance: 65.1 mL/min (by C-G formula based on SCr of 0.58 mg/dL). Liver Function Tests: Recent Labs  Lab 02/06/21 1226 02/07/21 1458  AST 30 29  ALT 22 19  ALKPHOS 344* 391*  BILITOT 0.6 0.8  PROT 6.9 7.4  ALBUMIN 3.5 3.7   Recent Labs  Lab 02/07/21 1833  LIPASE 23   No results for input(s): AMMONIA in the last 168 hours. Coagulation Profile: No results for input(s): INR, PROTIME in the last 168 hours. Cardiac Enzymes: No results for input(s): CKTOTAL, CKMB, CKMBINDEX, TROPONINI in the last 168 hours. BNP (last 3  results) No results for input(s): PROBNP in the last 8760 hours. HbA1C: No results for input(s): HGBA1C in the last 72 hours. CBG: No results for input(s): GLUCAP in the last 168 hours. Lipid Profile: No results for input(s): CHOL, HDL, LDLCALC, TRIG, CHOLHDL, LDLDIRECT in the last 72 hours. Thyroid Function Tests: No results for input(s): TSH, T4TOTAL, FREET4, T3FREE, THYROIDAB in the last 72 hours. Anemia Panel: No results for input(s): VITAMINB12, FOLATE, FERRITIN, TIBC, IRON, RETICCTPCT in the last 72 hours. Urine analysis:    Component Value Date/Time   COLORURINE STRAW (A) 04/27/2020 0928   APPEARANCEUR CLEAR 04/27/2020 0928   LABSPEC 1.015 05/30/2020 1402   PHURINE 6.0 05/30/2020 1402   GLUCOSEU NEGATIVE 05/30/2020 1402   HGBUR NEGATIVE 05/30/2020 1402   BILIRUBINUR NEGATIVE 05/30/2020 1402   KETONESUR NEGATIVE 05/30/2020 1402   PROTEINUR NEGATIVE 05/30/2020 1402   UROBILINOGEN 0.2 05/30/2020 1402   NITRITE NEGATIVE 05/30/2020 1402   LEUKOCYTESUR NEGATIVE 05/30/2020 1402    Radiological Exams on Admission: No results found.    Assessment/Plan Intractable nausea and vomiting with history of metastatic breast cancer  suspect cause could be tumor burden, heavy opioid use for pain management Compazine and Zofran PRN  One time dose of 0.5mg  Ativan   Chronic pain Will need to attempt oral with antiemetic as she is on high dose methadone, morphine and gabapentin   HTN  Continue Lisinopril  DVT prophylaxis:.Lovenox Code Status: Full Family Communication: Plan discussed with patient and family at bedside  disposition Plan: Home with observation Consults called:  Admission status: Observation  Level of care: Med-Surg  Status is: Observation  The patient remains OBS appropriate and will d/c before 2 midnights.  Dispo: The patient is from: Home              Anticipated d/c is to: Home              Anticipated d/c date is: 1 day              Patient currently  is not medically stable to d/c.   Difficult to place patient No         Orene Desanctis DO [facility]   If 7PM-7AM, please contact night-coverage [email or web address]   02/07/2021, 9:58 PM

## 2021-02-07 NOTE — Progress Notes (Signed)
[facility] Progress Note   Catherine Tyler [id number] [birth year] 65 y.o.  Catherine Tyler is managed by Dr. Lurline Del  Actively treated with chemotherapy/immunotherapy/hormonal therapy: yes  Current therapy: Xeloda, awaiting start of CMF  Last treated: 01/21/2021 (Xeloda)  Plan to start CMF on 02/13/2021.  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  She is pending start of CMF on 02/13/2021.  She is scheduled to be seen in follow-up on 02/20/2021.  Intractable nausea and vomiting: Patient was given Zofran 8 mg IV, Decadron 10 mg IV and was begun on 1 L of normal saline.  She has been transferred to the emergency room for evaluation and management given the fact that she is unable to keep down her medications and has been with out gabapentin 600 mg p.o. 3 times daily x48 hours.  There is concerned that she could be at risk for seizures.  Please see After Visit Summary for patient specific instructions.  Future Appointments  Date Time Provider [place]  02/13/2021  8:00 AM [facility] LAB [facility] None  02/13/2021  9:00 AM [facility] INFUSION [facility] None  02/13/2021 12:00 PM [facility] INJ 1 [facility] [place]  02/13/2021  3:00 PM [facility]NM 1 [facility] [place]  02/20/2021  2:30 PM [facility] LAB [facility] None  02/20/2021  3:00 PM Magrinat, Virgie Dad, MD [facility] None  02/21/2021  3:00 PM [facility]-MR 1 [facility] [place]  [place]  03/06/2021  1:00 PM [facility] LAB [facility] None  03/06/2021  1:45 PM [facility] [place] FLUSH [facility] None  04/03/2021  1:00 PM [facility] LAB [facility] None  04/03/2021  1:45 PM [facility] [place] FLUSH [facility] None    No orders of the defined types were placed in this encounter.      Subjective:   Patient ID:  Catherine Tyler is a 65 y.o. (DOB [birth year]) female.  Chief Complaint: No chief complaint on file.   Catherine Tyler is a 65 y.o. female with a diagnosis of an ER positive metastatic malignant neoplasm of the right breast with bone metastasis.  She is managed by Dr. Jana Hakim and is status post treatment with Xeloda with her last treatment given on 01/21/2021.  She is pending start of CMF on 02/13/2021 due to disease progression.  She was last seen yesterday.  She presents to the clinic today with her sister.  She reports that Catherine Tyler has been having intractable nausea and vomiting for the past 48 hours.  She is not able to keep down any of her oral medicines and has had little to nothing to eat or drink.  Of concern is the fact that she has not been able to take her pain medications nor has she had even been able to take gabapentin 600 mg p.o 3 times daily for the past 48 hours.  There is concerned that she could be at increased risk for seizures given her weakened state and the fact that she has stopped gabapentin abruptly.  She was begun on an IV of normal saline  and was given Zofran 8 mg IV x1 and Decadron 10 mg IV x1.  She is being transported to the emergency room for evaluation and management.    Medications: I have reviewed the patient's current medications.  Allergies:  Allergies  Allergen Reactions  . Demerol [Meperidine] Nausea And Vomiting  . Sulfa Antibiotics Nausea And Vomiting  . Tape Other (See Comments)    Takes off skin    Past Medical History:  Diagnosis Date  . Cancer ([place])   . Personal history of chemotherapy   . Personal history of radiation  therapy     Past Surgical History:  Procedure Laterality Date  . BREAST LUMPECTOMY    . BREAST SURGERY      Family History  Problem Relation Age of Onset  . Hyperlipidemia Mother   . Heart failure Mother     Social History   Socioeconomic History  . Marital status: Single    Spouse name: Not on file  . Number of children: Not on file  . Years of education: Not on file  . Highest education level: Not on file  Occupational History  . Not on file  Tobacco Use  . Smoking status: Current Some Day Smoker  . Smokeless tobacco: Never Used  Vaping Use  . Vaping Use: Never used  Substance and Sexual Activity  . Alcohol use: Yes    Comment: on occasion  . Drug use: Never  . Sexual activity: Not Currently  Other Topics Concern  . Not on file  Social History Narrative  . Not on file   Social Determinants of Health   Financial Resource Strain: Not on file  Food Insecurity: Not on file  Transportation Needs: Not on file  Physical Activity: Not on file  Stress: Not on file  Social Connections: Not on file  Intimate Partner Violence: Not on file    Past Medical History, Surgical history, Social history, and Family history were reviewed and updated as appropriate.   Please see review of systems for further details on the patient's review from today.   Review of Systems:  Review of Systems  Constitutional: Positive for appetite change and fatigue. Negative for chills, diaphoresis and fever.  HENT: Negative for trouble swallowing.   Respiratory: Negative for cough, choking, shortness of breath and wheezing.   Cardiovascular: Negative for chest pain and palpitations.  Gastrointestinal: Positive for nausea and vomiting. Negative for constipation and diarrhea.  Genitourinary: Negative for decreased urine volume.  Neurological: Positive for weakness. Negative for headaches.    Objective:   Physical Exam:  BP (!) 156/91 (BP Location: Left Wrist, Patient Position: Sitting)    Pulse 71   Temp 98 F (36.7 C) (Tympanic)   Resp 16   Ht 5\' 5"  (1.651 m)   Wt 124 lb 12.8 oz (56.6 kg)   LMP  (LMP Unknown)   SpO2 100%   BMI 20.77 kg/m  ECOG: 2  Physical Exam Constitutional:      Appearance: She is not diaphoretic.     Comments: The patient is an adult female who is retching.  HENT:     Head: Normocephalic and atraumatic.  Eyes:     General: No scleral icterus.       Right eye: No discharge.        Left eye: No discharge.     Conjunctiva/sclera: Conjunctivae normal.  Cardiovascular:     Rate and Rhythm: Normal rate and regular rhythm.     Heart sounds: Normal heart  sounds. No murmur heard. No friction rub. No gallop.   Pulmonary:     Effort: Pulmonary effort is normal. No respiratory distress.     Breath sounds: Normal breath sounds. No wheezing or rales.  Abdominal:     General: Bowel sounds are normal. There is no distension.     Tenderness: There is no abdominal tenderness. There is no guarding.  Skin:    General: Skin is warm and dry.     Findings: No erythema or rash.  Neurological:     Mental Status: She is alert.     Gait: Gait abnormal (The patient is ambulating with the use of a wheelchair.).     Lab Review:     Component Value Date/Time   NA 135 02/07/2021 1458   NA 140 09/02/2018 1106   K 4.2 02/07/2021 1458   CL 105 02/07/2021 1458   CO2 23 02/07/2021 1458   GLUCOSE 120 (H) 02/07/2021 1458   BUN 10 02/07/2021 1458   BUN 22 09/02/2018 1106   CREATININE 0.58 02/07/2021 1458   CALCIUM 8.5 (L) 02/07/2021 1458   PROT 7.4 02/07/2021 1458   ALBUMIN 3.7 02/07/2021 1458   AST 29 02/07/2021 1458   ALT 19 02/07/2021 1458   ALKPHOS 391 (H) 02/07/2021 1458   BILITOT 0.8 02/07/2021 1458   GFRNONAA >60 02/07/2021 1458   GFRAA >60 09/20/2020 1444       Component Value Date/Time   WBC 5.8 02/07/2021 1458   WBC 5.9 02/06/2021 1226   RBC 4.65 02/07/2021 1458   HGB 14.4 02/07/2021 1458   HGB 14.9 09/02/2018 1106   HCT 41.6  02/07/2021 1458   HCT 43.3 09/02/2018 1106   PLT 210 02/07/2021 1458   PLT 267 09/02/2018 1106   MCV 89.5 02/07/2021 1458   MCV 85 09/02/2018 1106   MCH 31.0 02/07/2021 1458   MCHC 34.6 02/07/2021 1458   RDW 13.5 02/07/2021 1458   RDW 13.0 09/02/2018 1106   LYMPHSABS 0.8 02/07/2021 1458   MONOABS 0.2 02/07/2021 1458   EOSABS 0.0 02/07/2021 1458   BASOSABS 0.0 02/07/2021 1458   -------------------------------  Imaging from last 24 hours (if applicable):  Radiology interpretation: DG Abd 2 Views  Result Date: 01/23/2021 CLINICAL DATA:  Evaluate for bowel obstruction. Diffuse abdominal pain. EXAM: ABDOMEN - 2 VIEW COMPARISON:  05/30/2020 FINDINGS: The bowel gas pattern is normal. There is no evidence of free air. No radio-opaque calculi or other significant radiographic abnormality is seen. IMPRESSION: Negative. Electronically Signed   By: Kerby Moors M.D.   On: 01/23/2021 12:16

## 2021-02-07 NOTE — Progress Notes (Signed)
RN spoke with Catherine Tyler's sister, [name]. Sister reporting Catherine Tyler is experiencing severe nausea, no active vomiting. Pt unable to drink or eat today.  Antiemetics are ineffective per sister.  Pt is alert and oriented.  Sister reports extreme weakness.      Catherine Tyler with history of stage IV breast cancer, and was seen by MD on 2/8.    Catherine Tyler is scheduled for symptom management evaluation today.  Catherine Tyler's sister notified and voiced appreciation.  MD aware

## 2021-02-07 NOTE — ED Notes (Signed)
Report attempted at this time.

## 2021-02-07 NOTE — ED Triage Notes (Signed)
Pt presented to providers office at cancer center today for intractable vomiting and nausea x 4 days. Pt says she has had episodes of emesis and nausea for a few weeks but the last four days her antinausea meds have not been able to resolve it. Pt states she is unable to keep down any food or medications.

## 2021-02-07 NOTE — ED Provider Notes (Signed)
[facility] DEPT Provider Note   CSN: [id number] Arrival date & time: 02/07/21  1803     History Chief Complaint  Patient presents with  . Emesis    Catherine Tyler is a 65 y.o. female past medical history of metastatic breast cancer, presenting to the emergency department with intractable nausea and vomiting.  Patient states nausea has been gradually worsening over the last 6 weeks.  The last 2 days she has been unable to keep down her daily medications.  She recently completed chemotherapy course on January 22, followed by Dr. Jana Hakim.  Per review of medical record, patient was seen today at the cancer center and sent to the ED for further evaluation after having a CBC and CMP drawn, she was also given 8 mg dose of IV Zofran and a liter of fluids were initiated. She has treated her nausea with Compazine, Zofran, and PR Phenergan without relief.  She is not having abdominal pain, only has a little bit of cramping in her stomach after drinking water and then proceeds to vomit. She has a GI appointment later in the month for evaluation of this new and worsening nausea vomiting.  The history is provided by the patient and medical records.       Past Medical History:  Diagnosis Date  . Cancer ([place])   . Personal history of chemotherapy   . Personal history of radiation therapy     Patient Active Problem List   Diagnosis Date Noted  . Intractable vomiting with nausea 02/07/2021  . Malignant neoplasm of overlapping sites of right breast in female, estrogen receptor positive ([place]) 06/17/2020  . Bone metastases ([place]) 06/17/2020  . Pain from bone metastases ([place]) 06/17/2020  . Constipation due to pain medication 06/02/2020  . Hypertension 09/02/2018  . Depression 09/02/2018  . Need for immunization against influenza 09/02/2018  . Goals of care, counseling/discussion 09/02/2018  . Dyslipidemia 09/02/2018    Past Surgical History:  Procedure Laterality  Date  . BREAST LUMPECTOMY    . BREAST SURGERY       OB History    Gravida  4   Para      Term      Preterm      AB  2   Living  1     SAB      IAB  2   Ectopic      Multiple      Live Births  1           Family History  Problem Relation Age of Onset  . Hyperlipidemia Mother   . Heart failure Mother     Social History   Tobacco Use  . Smoking status: Current Some Day Smoker  . Smokeless tobacco: Never Used  Vaping Use  . Vaping Use: Never used  Substance Use Topics  . Alcohol use: Yes    Comment: on occasion  . Drug use: Never    Home Medications Prior to Admission medications   Medication Sig Start Date End Date Taking? Authorizing Provider  acetaminophen (TYLENOL) 500 MG tablet Take 1 tablet (500 mg total) by mouth with breakfast, with lunch, and with evening meal. Take together with aleve 220 mg 06/16/20   Magrinat, Virgie Dad, MD  bisacodyl (DULCOLAX) 10 MG suppository Place 1 suppository (10 mg total) rectally as needed for moderate constipation. 09/20/20   Gardenia Phlegm, NP  [facility] MAGNESIUM CITRATE 1.745 GM/30ML SOLN Take 296 mLs by mouth once. 09/20/20  [provider]  dexamethasone (DECADRON) 4 MG tablet Take 1 tablet (4 mg total) by mouth in the morning. 02/06/21   Magrinat, Virgie Dad, MD  fluconazole (DIFLUCAN) 100 MG tablet Take 1 tablet (100 mg total) by mouth daily. 02/06/21   Magrinat, Virgie Dad, MD  gabapentin (NEURONTIN) 300 MG capsule 2 capsules (600 mg total) 3 (three) times daily. Gabapentin 375m 3 tablets PO QID #360 with no refills 02/06/21   Magrinat, GVirgie Dad MD  lisinopril (ZESTRIL) 10 MG tablet Take 1 tablet (10 mg total) by mouth daily. 07/11/20 07/11/21  [place], CNila NephewD, DO  methadone (DOLOPHINE) 10 MG tablet Take 1 tablet (10 mg total) by mouth every 8 (eight) hours. 01/08/21   Magrinat, GVirgie Dad MD  methadone (DOLOPHINE) 10 MG tablet Take 1.5 tablets (15 mg total) by mouth every 8 (eight) hours. 02/06/21   Magrinat,  GVirgie Dad MD  morphine (MSIR) 15 MG tablet Take 1 tablet (15 mg total) by mouth every 4 (four) hours as needed for severe pain. 01/31/21   Magrinat, GVirgie Dad MD  omeprazole (PRILOSEC) 40 MG capsule Take 1 capsule (40 mg total) by mouth daily. 08/17/20   CGardenia Phlegm NP  ondansetron (ZOFRAN ODT) 4 MG disintegrating tablet Take 1 tablet (4 mg total) by mouth every 8 (eight) hours as needed for nausea or vomiting. 04/27/20   FDeno Etienne DO  polyethylene glycol (MIRALAX / GLYCOLAX) 17 g packet Take 17 g by mouth daily.    [provider]  prochlorperazine (COMPAZINE) 10 MG tablet Take 1 tablet (10 mg total) by mouth every 6 (six) hours as needed for nausea or vomiting. 01/23/21   Tanner, VLyndon Code, PA-C  promethazine (PHENERGAN) 25 MG suppository Place 1 suppository (25 mg total) rectally every 6 (six) hours as needed for nausea or vomiting. 01/23/21   Tanner, VLyndon Code, PA-C  senna-docusate (SENOKOT-S) 8.6-50 MG tablet Take 2 tablets by mouth 2 (two) times daily. 09/20/20   CGardenia Phlegm NP  sodium phosphate (FLEET) 7-19 GM/118ML ENEM Place 133 mLs (1 enema total) rectally daily as needed for severe constipation. 09/20/20   CGardenia Phlegm NP    Allergies    Demerol [meperidine], Sulfa antibiotics, and Tape  Review of Systems   Review of Systems  Gastrointestinal: Positive for nausea and vomiting.  Allergic/Immunologic: Positive for immunocompromised state.  All other systems reviewed and are negative.   Physical Exam Updated Vital Signs BP (!) 134/106   Pulse 71   Temp 98.2 F (36.8 C) (Oral)   Resp 14   LMP  (LMP Unknown)   SpO2 99%   Physical Exam Vitals and nursing note reviewed.  Constitutional:      Appearance: She is well-developed and well-nourished.  HENT:     Head: Normocephalic and atraumatic.  Eyes:     Conjunctiva/sclera: Conjunctivae normal.  Cardiovascular:     Rate and Rhythm: Normal rate and regular rhythm.  Pulmonary:     Effort:  Pulmonary effort is normal. No respiratory distress.     Breath sounds: Normal breath sounds.  Abdominal:     General: Bowel sounds are normal.     Palpations: Abdomen is soft.     Tenderness: There is no abdominal tenderness. There is no guarding or rebound.  Skin:    General: Skin is warm.  Neurological:     Mental Status: She is alert.  Psychiatric:        Mood and Affect: Mood and affect normal.  Behavior: Behavior normal.     ED Results / Procedures / Treatments   Labs (all labs ordered are listed, but only abnormal results are displayed) Labs Reviewed  RESP PANEL BY RT-PCR (FLU A&B, COVID) ARPGX2  LIPASE, BLOOD  HIV ANTIBODY (ROUTINE TESTING W REFLEX)  BASIC METABOLIC PANEL  CBC    EKG None  Radiology No results found.  Procedures Procedures   Medications Ordered in ED Medications  enoxaparin (LOVENOX) injection 40 mg (has no administration in time range)  metoCLOPramide (REGLAN) injection 10 mg (10 mg Intravenous Given 02/07/21 1840)    ED Course  I have reviewed the triage vital signs and the nursing notes.  Pertinent labs & imaging results that were available during my care of the patient were reviewed by me and considered in my medical decision making (see chart for details).  Clinical Course as of 02/07/21 2033  Wed Feb 07, 2021  1928 I was directly involved in this patients medical care.  [JH]    Clinical Course User Index [JH] [place], Greggory Brandy, MD   MDM Rules/Calculators/A&P                          Patient with history of metastatic breast cancer, recent chemotherapy treatment, followed by Dr. Jana Hakim.  She is here with intractable nausea vomiting.  Minimal relief with Zofran and Reglan given here.  She is also given a liter of IV fluids which was initiated at the cancer center.  Labs drawn at the cancer center without significant change from baseline.  Elevated alk phos is noted though she is documented to have bony mets, likely source of  elevation as pt has normal AST and ALT.  Lipase added on here and is also within normal limits.   Will admit for intractable nausea vomiting for symptom control.  She is given a dose of pain medication, she is complaining of pain and has not been able to tolerate her pain medications.  Final Clinical Impression(s) / ED Diagnoses Final diagnoses:  Intractable nausea and vomiting    Rx / DC Orders ED Discharge Orders    None       Aquila Delaughter, [place] N, PA-C 02/07/21 2103    Luna Fuse, MD 02/09/21 2137

## 2021-02-08 DIAGNOSIS — R112 Nausea with vomiting, unspecified: Secondary | ICD-10-CM | POA: Diagnosis not present

## 2021-02-08 DIAGNOSIS — C7951 Secondary malignant neoplasm of bone: Secondary | ICD-10-CM | POA: Diagnosis not present

## 2021-02-08 DIAGNOSIS — C50911 Malignant neoplasm of unspecified site of right female breast: Secondary | ICD-10-CM | POA: Diagnosis not present

## 2021-02-08 LAB — BASIC METABOLIC PANEL
Anion gap: 9 (ref 5–15)
BUN: 13 mg/dL (ref 8–23)
CO2: 23 mmol/L (ref 22–32)
Calcium: 8 mg/dL — ABNORMAL LOW (ref 8.9–10.3)
Chloride: 104 mmol/L (ref 98–111)
Creatinine, Ser: 0.44 mg/dL (ref 0.44–1.00)
GFR, Estimated: >60 mL/min (ref >60)
Glucose, Bld: 104 mg/dL — ABNORMAL HIGH (ref 70–99)
Potassium: 4 mmol/L (ref 3.5–5.1)
Sodium: 136 mmol/L (ref 135–145)

## 2021-02-08 LAB — HIV ANTIBODY (ROUTINE TESTING W REFLEX): HIV Screen 4th Generation wRfx: NONREACTIVE

## 2021-02-08 LAB — CBC
HCT: 39.8 % (ref 36.0–46.0)
Hemoglobin: 13.4 g/dL (ref 12.0–15.0)
MCH: 31 pg (ref 26.0–34.0)
MCHC: 33.7 g/dL (ref 30.0–36.0)
MCV: 92.1 fL (ref 80.0–100.0)
Platelets: 196 10*3/uL (ref 150–400)
RBC: 4.32 MIL/uL (ref 3.87–5.11)
RDW: 13.4 % (ref 11.5–15.5)
WBC: 5.1 10*3/uL (ref 4.0–10.5)
nRBC: 0 % (ref 0.0–0.2)

## 2021-02-08 MED ORDER — LISINOPRIL 10 MG PO TABS
10.0000 mg | ORAL_TABLET | Freq: Every day | ORAL | Status: DC
  Administered 2021-02-08 – 2021-02-09 (×2): 10 mg via ORAL
  Filled 2021-02-08 (×2): qty 1

## 2021-02-08 MED ORDER — POLYETHYLENE GLYCOL 3350 17 G PO PACK
17.0000 g | PACK | Freq: Every day | ORAL | Status: DC
  Administered 2021-02-08 – 2021-02-09 (×2): 17 g via ORAL
  Filled 2021-02-08 (×2): qty 1

## 2021-02-08 MED ORDER — GABAPENTIN 300 MG PO CAPS
600.0000 mg | ORAL_CAPSULE | Freq: Three times a day (TID) | ORAL | Status: DC
  Administered 2021-02-08 – 2021-02-09 (×4): 600 mg via ORAL
  Filled 2021-02-08 (×4): qty 2

## 2021-02-08 MED ORDER — SENNOSIDES-DOCUSATE SODIUM 8.6-50 MG PO TABS
2.0000 | ORAL_TABLET | Freq: Two times a day (BID) | ORAL | Status: DC
  Administered 2021-02-08 – 2021-02-09 (×3): 2 via ORAL
  Filled 2021-02-08 (×3): qty 2

## 2021-02-08 MED ORDER — MORPHINE SULFATE (PF) 2 MG/ML IV SOLN
2.0000 mg | Freq: Once | INTRAVENOUS | Status: DC
Start: 2021-02-08 — End: 2021-02-09

## 2021-02-08 MED ORDER — METHADONE HCL 10 MG PO TABS
10.0000 mg | ORAL_TABLET | Freq: Three times a day (TID) | ORAL | Status: DC
Start: 2021-02-08 — End: 2021-02-08
  Administered 2021-02-08: 06:00:00 10 mg via ORAL
  Filled 2021-02-08: qty 1

## 2021-02-08 MED ORDER — BISACODYL 10 MG RE SUPP: 10.0000 mg | RECTAL | Status: DC | PRN

## 2021-02-08 MED ORDER — PANTOPRAZOLE SODIUM 40 MG PO TBEC
40.0000 mg | DELAYED_RELEASE_TABLET | Freq: Every day | ORAL | Status: DC
  Administered 2021-02-08 – 2021-02-09 (×2): 40 mg via ORAL
  Filled 2021-02-08 (×2): qty 1

## 2021-02-08 MED ORDER — METHADONE HCL 10 MG PO TABS
15.0000 mg | ORAL_TABLET | Freq: Three times a day (TID) | ORAL | Status: DC
  Administered 2021-02-08 – 2021-02-09 (×3): 15 mg via ORAL
  Filled 2021-02-08 (×4): qty 2

## 2021-02-08 MED ORDER — MORPHINE SULFATE 15 MG PO TABS
15.0000 mg | ORAL_TABLET | ORAL | Status: DC | PRN
  Administered 2021-02-08 (×2): 15 mg via ORAL
  Filled 2021-02-08 (×2): qty 1

## 2021-02-08 NOTE — Progress Notes (Signed)
Catherine Tyler   DOB:[birth year]   MP#:[id number]   CSN#:[id number]  Subjective:  Catherine Tyler tells me her pain is better controlled. She moves easily in the room. No BMs yet but "one is coming by morning." No nausea or vomiting. She is hoping to get home in AM. -- She tells me "a pharmacist" told her the chemotherapy she is contemplating (CMF) is very harsh and people don't tolerate it well. This is surprising, as the opposite is the case. Based on that Catherine Tyler was thinking of not getting any more treatment. Then however she said "I want to live," and tells me she will proceed with CMF as suggested   Objective: white woman who moved easily from bed to chair and around theroom Vitals:   02/08/21 0601 02/08/21 1334  BP: 118/86 126/80  Pulse: 79 77  Resp: 18 17  Temp: 98.4 F (36.9 C) 98.2 F (36.8 C)  SpO2: 98% 100%    There is no height or weight on file to calculate BMI.  Intake/Output Summary (Last 24 hours) at 02/08/2021 2115 Last data filed at 02/08/2021 1820 Gross per 24 hour  Intake 480 ml  Output 0 ml  Net 480 ml     CBG (last 3)  No results for input(s): GLUCAP in the last 72 hours.   Labs:  Lab Results  Component Value Date   WBC 5.1 02/08/2021   HGB 13.4 02/08/2021   HCT 39.8 02/08/2021   MCV 92.1 02/08/2021   PLT 196 02/08/2021   NEUTROABS 4.7 02/07/2021    @LASTCHEMISTRY @  Urine Studies No results for input(s): UHGB, CRYS in the last 72 hours.  Invalid input(s): UACOL, UAPR, USPG, UPH, UTP, UGL, UKET, UBIL, UNIT, UROB, ULEU, UEPI, UWBC, URBC, UBAC, CAST, UCOM, [place]  Basic Metabolic Panel: Recent Labs  Lab 02/06/21 1226 02/07/21 1458 02/08/21 0523  NA 134* 135 136  K 4.0 4.2 4.0  CL 103 105 104  CO2 25 23 23   GLUCOSE 126* 120* 104*  BUN 14 10 13   CREATININE 0.69 0.58 0.44  CALCIUM 8.5* 8.5* 8.0*   GFR Estimated Creatinine Clearance: 65.1 mL/min (by C-G formula based on SCr of 0.44 mg/dL). Liver Function Tests: Recent Labs  Lab 02/06/21 1226  02/07/21 1458  AST 30 29  ALT 22 19  ALKPHOS 344* 391*  BILITOT 0.6 0.8  PROT 6.9 7.4  ALBUMIN 3.5 3.7   Recent Labs  Lab 02/07/21 1833  LIPASE 23   No results for input(s): AMMONIA in the last 168 hours. Coagulation profile No results for input(s): INR, PROTIME in the last 168 hours.  CBC: Recent Labs  Lab 02/06/21 1226 02/07/21 1458 02/08/21 0523  WBC 5.9 5.8 5.1  NEUTROABS 4.1 4.7  --   HGB 13.6 14.4 13.4  HCT 40.7 41.6 39.8  MCV 92.7 89.5 92.1  PLT 195 210 196   Cardiac Enzymes: No results for input(s): CKTOTAL, CKMB, CKMBINDEX, TROPONINI in the last 168 hours. BNP: Invalid input(s): POCBNP CBG: No results for input(s): GLUCAP in the last 168 hours. D-Dimer No results for input(s): DDIMER in the last 72 hours. Hgb A1c No results for input(s): HGBA1C in the last 72 hours. Lipid Profile No results for input(s): CHOL, HDL, LDLCALC, TRIG, CHOLHDL, LDLDIRECT in the last 72 hours. Thyroid function studies No results for input(s): TSH, T4TOTAL, T3FREE, THYROIDAB in the last 72 hours.  Invalid input(s): FREET3 Anemia work up No results for input(s): VITAMINB12, FOLATE, FERRITIN, TIBC, IRON, RETICCTPCT in the last 72 hours. Microbiology Recent  Results (from the past 240 hour(s))  Resp Panel by RT-PCR (Flu A&B, Covid) Nasopharyngeal Swab     Status: None   Collection Time: 02/07/21  9:01 PM   Specimen: Nasopharyngeal Swab; Nasopharyngeal(NP) swabs in vial transport medium  Result Value Ref Range Status   SARS Coronavirus 2 by RT PCR NEGATIVE NEGATIVE Final    Comment: (NOTE) SARS-CoV-2 target nucleic acids are NOT DETECTED.  The SARS-CoV-2 RNA is generally detectable in upper respiratory specimens during the acute phase of infection. The lowest concentration of SARS-CoV-2 viral copies this assay can detect is 138 copies/mL. A negative result does not preclude SARS-Cov-2 infection and should not be used as the sole basis for treatment or other patient  management decisions. A negative result may occur with  improper specimen collection/handling, submission of specimen other than nasopharyngeal swab, presence of viral mutation(s) within the areas targeted by this assay, and inadequate number of viral copies(<138 copies/mL). A negative result must be combined with clinical observations, patient history, and epidemiological information. The expected result is Negative.  Fact Sheet for Patients:  EntrepreneurPulse.com.au  Fact Sheet for Healthcare Providers:  IncredibleEmployment.be  This test is no t yet approved or cleared by the [place] FDA and  has been authorized for detection and/or diagnosis of SARS-CoV-2 by FDA under an Emergency Use Authorization (EUA). This EUA will remain  in effect (meaning this test can be used) for the duration of the COVID-19 declaration under Section 564(b)(1) of the Act, 21 U.S.C.section 360bbb-3(b)(1), unless the authorization is terminated  or revoked sooner.       Influenza A by PCR NEGATIVE NEGATIVE Final   Influenza B by PCR NEGATIVE NEGATIVE Final    Comment: (NOTE) The Xpert Xpress SARS-CoV-2/FLU/RSV plus assay is intended as an aid in the diagnosis of influenza from Nasopharyngeal swab specimens and should not be used as a sole basis for treatment. Nasal washings and aspirates are unacceptable for Xpert Xpress SARS-CoV-2/FLU/RSV testing.  Fact Sheet for Patients: EntrepreneurPulse.com.au  Fact Sheet for Healthcare Providers: IncredibleEmployment.be  This test is not yet approved or cleared by the [place] FDA and has been authorized for detection and/or diagnosis of SARS-CoV-2 by FDA under an Emergency Use Authorization (EUA). This EUA will remain in effect (meaning this test can be used) for the duration of the COVID-19 declaration under Section 564(b)(1) of the Act, 21 U.S.C. section 360bbb-3(b)(1),  unless the authorization is terminated or revoked.  Performed at [facility], [place] [address] [place], [place] [address]       Studies:  No results found.  Assessment: 65 y.o. [place] woman   (1) status post right lumpectomy and axillary lymph node dissection in 2007 for a stage III invasive breast cancer, additional pathology details to be reviewed when available             (a) a total of 13 axillary lymph nodes were removed, 11 positive             (b) adjuvant chemotherapy consisted of docetaxel, doxorubicin and cyclophosphamide x6             (c) the patient received adjuvant radiation             (d) received letrozole briefly but did not tolerate it.  METASTATIC DISEASE: June 2021 (2) CT scans of the chest abdomen and pelvis 06/15/2020 shows widespread bony metastatic disease, no definitive visceral disease             (a)  bone marrow biopsy  06/20/2020 nondiagnostic             (b) F 18 estradiol PET scan (cerianna) 07/31/2020 shows no estrogen uptake in the known bone lesions or elsewhere  (3) Capecitabine started on 08/24/2020 at 1500 mg twice daily, on radiation days only--with moderate palmar plantar erythrodysesthesia             (a) Xgeva started on 07/13/2020 given every 4 weeks             (b) capecitabine resumed 11/30/2020 at metronomic doses (1 g twice daily, with no breaks)             (c) discontinued as of 01/21/2021 with multiple side effects and a rising tumor marker  (4) palliative radiation 10/23/20 - 11/10/20 Site/dose: 1. The left chest was treated to a dose of 37.5 Gy in 15 fractios using a 5-field 3D conformal technique. 2. The right sacrum was treated to a dose of 37.5 Gy in 15 fractios using a 4-field isodose technique.  (5) pain management:             (a) started methadone 5 mg every 8 hours 11/30/2020, dose gradually increased to 15 mg every 8 hours starting 02/06/2021             (b) also on MSIR 15 mg as  needed for breakthrough pain             (c) bowel prophylaxis: MiraLAX daily, stool softeners 2 tablets twice daily, prune juice and milk of magnesia  (6) cyclophosphamide, methotrexate, fluorouracil [CMF] chemotherapy to start 02/13/2021   Plan:  Catherine Tyler's pain seems to be well controlled on her current medications. She has no vomiting now and tells me she wants to eat. Still no BM.  I find it difficult to understand exactly what happened to bring her in. IIt is always hard to know if patients take pain medicine as prescribed, so I am glad to see she appears to be doing well on her methadone at 15 mg TID which she is receiving here, with MSIR for breakthrough.   She is scheduled for CMF chemo next week but I will postpone that until her restaging studies have been done. We will still see her next week to make sure she is stable after discharge.   If she is going to be here another day or two I would favor a palliative care consult to help her clarify her goals  Greatly appreciate your help to this patient!  GM   [name], MD 02/08/2021  9:15 PM Medical Oncology and Hematology [facility] [address] [place], [place] [address] Tel. [phone]    Fax. [phone]

## 2021-02-08 NOTE — Progress Notes (Signed)
PROGRESS NOTE    Catherine Tyler  SWN:[id number] DOB: [birth year] DOA: 02/07/2021 PCP: Patient, No Pcp Per     Brief Narrative:  Catherine Tyler is a 65 y.o. female with medical history significant for metastatic breast cancer to bone, hypertension who presents with concerns of intractable nausea and vomiting. She reports ongoing nausea and vomiting for at least 6 weeks. She thought initially could be due to her heavy opioid use for her cancer pain and attempted to taper down but this made her symptoms much worse. Her vomitus is nonbloody but biliary at times. She also notes upper epigastric crampy abdominal pain and some significant "gurgling" during her nausea episodes. She has issues with severe constipation and her stool is chronically loose since she is on several stool softeners. She attempted to treat with Compazine, Zofran and suppository Phenergan at home with no significant relief. Last chemotherapy session was on January 22. Around that time she was evaluated in the ED for similar symptoms. She was evaluated by GI and supposedly had an abdominal x-ray that did not show obstruction. She presented to oncology outpatient 2/9 with the symptoms and was sent for concerns of potential withdrawal since she has not been able to take her high-dose gabapentin at home.  She was given 8 mg IV Zofran, 10 mg of IV Decadron and was started on 1 L of normal saline prior to her transfer to the ER.  New events last 24 hours / Subjective: No further vomiting since admission.  Has been tolerating very small amounts of clear liquid diet.  Complaining of hip pain which is chronic in nature  Assessment & Plan:   Principal Problem:   Intractable vomiting with nausea Active Problems:   Hypertension   Pain from bone metastases (HCC)   Intractable nausea and vomiting -Multifactorial in setting of tumor burden, heavy opioid use, chemotherapy -Improving -Continue antiemetics as needed -Advance to full liquid diet  today  Metastatic breast cancer -Follows with Dr. Jana Hakim  Chronic pain -Continue home medications including methadone, MSIR, gabapentin  Hypertension -Continue lisinopril   DVT prophylaxis:  enoxaparin (LOVENOX) injection 40 mg Start: 02/07/21 2200  Code Status: Full code Family Communication: No family at bedside Disposition Plan:  Status is: Observation  The patient will require care spanning > 2 midnights and should be moved to inpatient because: IV treatments appropriate due to intensity of illness or inability to take PO  Dispo: The patient is from: Home              Anticipated d/c is to: Home              Anticipated d/c date is: 1 day              Patient currently is not medically stable to d/c.  Slowly advance diet today and continue to monitor for nausea and vomiting.  Resume home medications and monitor for tolerance.   Difficult to place patient No   Antimicrobials:  Anti-infectives (From admission, onward)   None        Objective: Vitals:   02/07/21 2208 02/07/21 2246 02/08/21 0142 02/08/21 0601  BP: 138/61 (!) 131/92 135/76 118/86  Pulse: 76 75 88 79  Resp: 14 16 18 18   Temp:  98.6 F (37 C) 98.9 F (37.2 C) 98.4 F (36.9 C)  TempSrc:  Oral Oral Oral  SpO2: 98% 98% 100% 98%    Intake/Output Summary (Last 24 hours) at 02/08/2021 1040 Last data filed at 02/08/2021 9381 Gross  per 24 hour  Intake 240 ml  Output 0 ml  Net 240 ml   There were no vitals filed for this visit.  Examination:  General exam: Appears calm and comfortable  Respiratory system: Clear to auscultation. Respiratory effort normal. No respiratory distress. No conversational dyspnea.  Cardiovascular system: S1 & S2 heard, RRR. No murmurs. No pedal edema. Gastrointestinal system: Abdomen is nondistended, soft and nontender. Normal bowel sounds heard. Central nervous system: Alert and oriented. No focal neurological deficits. Speech clear.  Extremities: Symmetric in appearance   Skin: No rashes, lesions or ulcers on exposed skin  Psychiatry: Judgement and insight appear normal. Mood & affect appropriate.   Data Reviewed: I have personally reviewed following labs and imaging studies  CBC: Recent Labs  Lab 02/06/21 1226 02/07/21 1458 02/08/21 0523  WBC 5.9 5.8 5.1  NEUTROABS 4.1 4.7  --   HGB 13.6 14.4 13.4  HCT 40.7 41.6 39.8  MCV 92.7 89.5 92.1  PLT 195 210 710   Basic Metabolic Panel: Recent Labs  Lab 02/06/21 1226 02/07/21 1458 02/08/21 0523  NA 134* 135 136  K 4.0 4.2 4.0  CL 103 105 104  CO2 25 23 23   GLUCOSE 126* 120* 104*  BUN 14 10 13   CREATININE 0.69 0.58 0.44  CALCIUM 8.5* 8.5* 8.0*   GFR: Estimated Creatinine Clearance: 65.1 mL/min (by C-G formula based on SCr of 0.44 mg/dL). Liver Function Tests: Recent Labs  Lab 02/06/21 1226 02/07/21 1458  AST 30 29  ALT 22 19  ALKPHOS 344* 391*  BILITOT 0.6 0.8  PROT 6.9 7.4  ALBUMIN 3.5 3.7   Recent Labs  Lab 02/07/21 1833  LIPASE 23   No results for input(s): AMMONIA in the last 168 hours. Coagulation Profile: No results for input(s): INR, PROTIME in the last 168 hours. Cardiac Enzymes: No results for input(s): CKTOTAL, CKMB, CKMBINDEX, TROPONINI in the last 168 hours. BNP (last 3 results) No results for input(s): PROBNP in the last 8760 hours. HbA1C: No results for input(s): HGBA1C in the last 72 hours. CBG: No results for input(s): GLUCAP in the last 168 hours. Lipid Profile: No results for input(s): CHOL, HDL, LDLCALC, TRIG, CHOLHDL, LDLDIRECT in the last 72 hours. Thyroid Function Tests: No results for input(s): TSH, T4TOTAL, FREET4, T3FREE, THYROIDAB in the last 72 hours. Anemia Panel: No results for input(s): VITAMINB12, FOLATE, FERRITIN, TIBC, IRON, RETICCTPCT in the last 72 hours. Sepsis Labs: No results for input(s): PROCALCITON, LATICACIDVEN in the last 168 hours.  Recent Results (from the past 240 hour(s))  Resp Panel by RT-PCR (Flu A&B, Covid)  Nasopharyngeal Swab     Status: None   Collection Time: 02/07/21  9:01 PM   Specimen: Nasopharyngeal Swab; Nasopharyngeal(NP) swabs in vial transport medium  Result Value Ref Range Status   SARS Coronavirus 2 by RT PCR NEGATIVE NEGATIVE Final    Comment: (NOTE) SARS-CoV-2 target nucleic acids are NOT DETECTED.  The SARS-CoV-2 RNA is generally detectable in upper respiratory specimens during the acute phase of infection. The lowest concentration of SARS-CoV-2 viral copies this assay can detect is 138 copies/mL. A negative result does not preclude SARS-Cov-2 infection and should not be used as the sole basis for treatment or other patient management decisions. A negative result may occur with  improper specimen collection/handling, submission of specimen other than nasopharyngeal swab, presence of viral mutation(s) within the areas targeted by this assay, and inadequate number of viral copies(<138 copies/mL). A negative result must be combined with clinical observations,  patient history, and epidemiological information. The expected result is Negative.  Fact Sheet for Patients:  EntrepreneurPulse.com.au  Fact Sheet for Healthcare Providers:  IncredibleEmployment.be  This test is no t yet approved or cleared by the [place] FDA and  has been authorized for detection and/or diagnosis of SARS-CoV-2 by FDA under an Emergency Use Authorization (EUA). This EUA will remain  in effect (meaning this test can be used) for the duration of the COVID-19 declaration under Section 564(b)(1) of the Act, 21 U.S.C.section 360bbb-3(b)(1), unless the authorization is terminated  or revoked sooner.       Influenza A by PCR NEGATIVE NEGATIVE Final   Influenza B by PCR NEGATIVE NEGATIVE Final    Comment: (NOTE) The Xpert Xpress SARS-CoV-2/FLU/RSV plus assay is intended as an aid in the diagnosis of influenza from Nasopharyngeal swab specimens and should not be  used as a sole basis for treatment. Nasal washings and aspirates are unacceptable for Xpert Xpress SARS-CoV-2/FLU/RSV testing.  Fact Sheet for Patients: EntrepreneurPulse.com.au  Fact Sheet for Healthcare Providers: IncredibleEmployment.be  This test is not yet approved or cleared by the [place] FDA and has been authorized for detection and/or diagnosis of SARS-CoV-2 by FDA under an Emergency Use Authorization (EUA). This EUA will remain in effect (meaning this test can be used) for the duration of the COVID-19 declaration under Section 564(b)(1) of the Act, 21 U.S.C. section 360bbb-3(b)(1), unless the authorization is terminated or revoked.  Performed at [facility], [place] [address] [place], [place] [address]       Radiology Studies: No results found.    Scheduled Meds: . enoxaparin (LOVENOX) injection  40 mg Subcutaneous Q24H  . gabapentin  600 mg Oral TID  . lisinopril  10 mg Oral Daily  . methadone  10 mg Oral Q8H  . pantoprazole  40 mg Oral Daily  . polyethylene glycol  17 g Oral Daily  . senna-docusate  2 tablet Oral BID   Continuous Infusions:   LOS: 0 days      Time spent: 25 minutes   Dessa Phi, DO [facility] 02/08/2021, 10:40 AM   Available via Epic secure chat 7am-7pm After these hours, please refer to coverage provider listed on [email or web address]

## 2021-02-08 NOTE — Progress Notes (Signed)
[facility] Note  Covering for [facility] chaplain, responded to RN request for help with Advance Directives. Reviewed AD paperwork with Catherine Tyler and her sister Catherine Tyler. They plan to have the forms notarized after admission.  They also have several physician questions:  --How do we change Catherine Tyler's status to DNR? --Would it be helpful to complete a MOST form? --Could you please put in a hospice referral?  Thank you! Plan to follow up with Catherine Tyler by phone from the outpatient [place] side.   [place], [place], [facility] M-F daytime pager [phone] [facility] 24/7 pager [phone] Voicemail [phone]

## 2021-02-09 DIAGNOSIS — R112 Nausea with vomiting, unspecified: Secondary | ICD-10-CM | POA: Diagnosis not present

## 2021-02-09 MED ORDER — METOCLOPRAMIDE HCL 5 MG PO TABS: 5.0000 mg | ORAL_TABLET | Freq: Three times a day (TID) | ORAL | 0 refills | Status: DC | PRN

## 2021-02-09 NOTE — Discharge Summary (Signed)
Physician Discharge Summary  Catherine Tyler RXV:[id number] DOB: September 01, 1956 DOA: 02/07/2021  PCP: Patient, No Pcp Per  Admit date: 02/07/2021 Discharge date: 02/09/2021  Admitted From: Home Disposition:  Home  Recommendations for Outpatient Follow-up:  Follow up with Dr. Jana Hakim as scheduled  Discharge Condition: Stable CODE STATUS: Full  Diet recommendation:  Diet Orders (From admission, onward)    Start     Ordered   02/09/21 0849  DIET SOFT Room service appropriate? Yes; Fluid consistency: Thin  Diet effective now       Question Answer Comment  Room service appropriate? Yes   Fluid consistency: Thin      02/09/21 0848         Brief/Interim Summary: Catherine Smithis a 65 y.o.femalewith medical history significant formetastatic breast cancer to bone, hypertension who presents with concerns of intractable nausea and vomiting. She reports ongoing nausea and vomiting for at least 6 weeks. She thought initially could be due to her heavy opioid use for her cancer pain and attempted to taper down but this made her symptoms much worse. Her vomitus is nonbloody but biliary at times. She also notes upper epigastriccrampyabdominal pain and some significant "gurgling" during her nausea episodes. She has issues with severe constipationandher stool is chronically loose since she is on severalstool softeners. She attempted to treat with Compazine, Zofran and suppository Phenergan at homewith no significant relief. Last chemotherapy session was on January 22. Around that time she was evaluated in the ED for similar symptoms. She was evaluated by GI and supposedly had an abdominal x-ray that did not show obstruction. She presented tooncology outpatient 2/9 with the symptoms and was sent for concerns of potential withdrawal since she has not been able to take her high-dose gabapentin at home. She was given 8 mg IV Zofran, 10 mg of IV Decadron and was started on 1 L of normal saline prior to her  transfer to the ER.   She was managed with conservative therapy with improvement in nausea, vomiting. Diet was advanced and patient was able to tolerate her PO medications and she felt ready for discharge home.   Discharge Diagnoses:  Principal Problem:   Intractable vomiting with nausea Active Problems:   Hypertension   Pain from bone metastases (HCC)   Intractable nausea and vomiting -Multifactorial in setting of tumor burden, heavy opioid use, chemotherapy -Improved  Metastatic breast cancer -Follows with Dr. Jana Hakim  Chronic pain -Continue home medications including methadone, MSIR, gabapentin  Hypertension -Continue lisinopril   Discharge Instructions  Discharge Instructions    Call MD for:  difficulty breathing, headache or visual disturbances   Complete by: As directed    Call MD for:  extreme fatigue   Complete by: As directed    Call MD for:  persistant dizziness or light-headedness   Complete by: As directed    Call MD for:  persistant nausea and vomiting   Complete by: As directed    Call MD for:  severe uncontrolled pain   Complete by: As directed    Call MD for:  temperature >100.4   Complete by: As directed    Discharge instructions   Complete by: As directed    You were cared for by a hospitalist during your hospital stay. If you have any questions about your discharge medications or the care you received while you were in the hospital after you are discharged, you can call the unit and ask to speak with the hospitalist on call if the hospitalist that took  care of you is not available. Once you are discharged, your primary care physician will handle any further medical issues. Please note that NO REFILLS for any discharge medications will be authorized once you are discharged, as it is imperative that you return to your primary care physician (or establish a relationship with a primary care physician if you do not have one) for your aftercare needs so that  they can reassess your need for medications and monitor your lab values.   Increase activity slowly   Complete by: As directed      Allergies as of 02/09/2021      Reactions   Demerol [meperidine] Nausea And Vomiting   Sulfa Antibiotics Nausea And Vomiting   Tape Other (See Comments)   Takes off skin      Medication List    STOP taking these medications   acetaminophen 500 MG tablet Commonly known as: TYLENOL     TAKE these medications   bisacodyl 10 MG suppository Commonly known as: Dulcolax Place 1 suppository (10 mg total) rectally as needed for moderate constipation.   dexamethasone 4 MG tablet Commonly known as: DECADRON Take 1 tablet (4 mg total) by mouth in the morning.   fluconazole 100 MG tablet Commonly known as: DIFLUCAN Take 1 tablet (100 mg total) by mouth daily.   gabapentin 300 MG capsule Commonly known as: NEURONTIN Take 600 mg by mouth 3 (three) times daily.   lisinopril 10 MG tablet Commonly known as: ZESTRIL Take 1 tablet (10 mg total) by mouth daily.   methadone 10 MG tablet Commonly known as: DOLOPHINE Take 1.5 tablets (15 mg total) by mouth every 8 (eight) hours. What changed: Another medication with the same name was removed. Continue taking this medication, and follow the directions you see here.   metoCLOPramide 5 MG tablet Commonly known as: Reglan Take 1 tablet (5 mg total) by mouth every 8 (eight) hours as needed for up to 30 doses for nausea, vomiting or refractory nausea / vomiting.   morphine 15 MG tablet Commonly known as: MSIR Take 1 tablet (15 mg total) by mouth every 4 (four) hours as needed for severe pain.   omeprazole 40 MG capsule Commonly known as: PRILOSEC Take 1 capsule (40 mg total) by mouth daily.   ondansetron 4 MG disintegrating tablet Commonly known as: Zofran ODT Take 1 tablet (4 mg total) by mouth every 8 (eight) hours as needed for nausea or vomiting.   polyethylene glycol 17 g packet Commonly known as:  MIRALAX / GLYCOLAX Take 17 g by mouth daily.   prochlorperazine 10 MG tablet Commonly known as: COMPAZINE Take 1 tablet (10 mg total) by mouth every 6 (six) hours as needed for nausea or vomiting.   promethazine 25 MG suppository Commonly known as: PHENERGAN Place 1 suppository (25 mg total) rectally every 6 (six) hours as needed for nausea or vomiting.   senna-docusate 8.6-50 MG tablet Commonly known as: Senokot-S Take 2 tablets by mouth 2 (two) times daily.   sodium phosphate 7-19 GM/118ML Enem Place 133 mLs (1 enema total) rectally daily as needed for severe constipation.       Follow-up Information    Magrinat, Virgie Dad, MD Follow up.   Specialty: Oncology Contact information: [address] [place] [address] [phone]              Allergies  Allergen Reactions  . Demerol [Meperidine] Nausea And Vomiting  . Sulfa Antibiotics Nausea And Vomiting  . Tape Other (  See Comments)    Takes off skin    Consultations:  Oncology    Procedures/Studies: DG Abd 2 Views  Result Date: 01/23/2021 CLINICAL DATA:  Evaluate for bowel obstruction. Diffuse abdominal pain. EXAM: ABDOMEN - 2 VIEW COMPARISON:  05/30/2020 FINDINGS: The bowel gas pattern is normal. There is no evidence of free air. No radio-opaque calculi or other significant radiographic abnormality is seen. IMPRESSION: Negative. Electronically Signed   By: Kerby Moors M.D.   On: 01/23/2021 12:16      Discharge Exam: Vitals:   02/08/21 2125 02/09/21 0556  BP: 125/75 (!) 141/80  Pulse: 67 64  Resp: 17 18  Temp: 98.5 F (36.9 C) 98.1 F (36.7 C)  SpO2: 99% 100%    General: Pt is alert, awake, not in acute distress Cardiovascular: RRR, S1/S2 +, no edema Respiratory: CTA bilaterally, no wheezing, no rhonchi, no respiratory distress, no conversational dyspnea  Abdominal: Soft, NT, ND, bowel sounds + Extremities: no edema, no cyanosis Psych: Normal mood and affect, stable judgement and  insight     The results of significant diagnostics from this hospitalization (including imaging, microbiology, ancillary and laboratory) are listed below for reference.     Microbiology: Recent Results (from the past 240 hour(s))  Resp Panel by RT-PCR (Flu A&B, Covid) Nasopharyngeal Swab     Status: None   Collection Time: 02/07/21  9:01 PM   Specimen: Nasopharyngeal Swab; Nasopharyngeal(NP) swabs in vial transport medium  Result Value Ref Range Status   SARS Coronavirus 2 by RT PCR NEGATIVE NEGATIVE Final    Comment: (NOTE) SARS-CoV-2 target nucleic acids are NOT DETECTED.  The SARS-CoV-2 RNA is generally detectable in upper respiratory specimens during the acute phase of infection. The lowest concentration of SARS-CoV-2 viral copies this assay can detect is 138 copies/mL. A negative result does not preclude SARS-Cov-2 infection and should not be used as the sole basis for treatment or other patient management decisions. A negative result may occur with  improper specimen collection/handling, submission of specimen other than nasopharyngeal swab, presence of viral mutation(s) within the areas targeted by this assay, and inadequate number of viral copies(<138 copies/mL). A negative result must be combined with clinical observations, patient history, and epidemiological information. The expected result is Negative.  Fact Sheet for Patients:  EntrepreneurPulse.com.au  Fact Sheet for Healthcare Providers:  IncredibleEmployment.be  This test is no t yet approved or cleared by the [place] FDA and  has been authorized for detection and/or diagnosis of SARS-CoV-2 by FDA under an Emergency Use Authorization (EUA). This EUA will remain  in effect (meaning this test can be used) for the duration of the COVID-19 declaration under Section 564(b)(1) of the Act, 21 U.S.C.section 360bbb-3(b)(1), unless the authorization is terminated  or revoked  sooner.       Influenza A by PCR NEGATIVE NEGATIVE Final   Influenza B by PCR NEGATIVE NEGATIVE Final    Comment: (NOTE) The Xpert Xpress SARS-CoV-2/FLU/RSV plus assay is intended as an aid in the diagnosis of influenza from Nasopharyngeal swab specimens and should not be used as a sole basis for treatment. Nasal washings and aspirates are unacceptable for Xpert Xpress SARS-CoV-2/FLU/RSV testing.  Fact Sheet for Patients: EntrepreneurPulse.com.au  Fact Sheet for Healthcare Providers: IncredibleEmployment.be  This test is not yet approved or cleared by the [place] FDA and has been authorized for detection and/or diagnosis of SARS-CoV-2 by FDA under an Emergency Use Authorization (EUA). This EUA will remain in effect (meaning this test can be  used) for the duration of the COVID-19 declaration under Section 564(b)(1) of the Act, 21 U.S.C. section 360bbb-3(b)(1), unless the authorization is terminated or revoked.  Performed at [facility], [place] [address], [place], [place] [address]      Labs: BNP (last 3 results) No results for input(s): BNP in the last 8760 hours. Basic Metabolic Panel: Recent Labs  Lab 02/06/21 1226 02/07/21 1458 02/08/21 0523  NA 134* 135 136  K 4.0 4.2 4.0  CL 103 105 104  CO2 25 23 23   GLUCOSE 126* 120* 104*  BUN 14 10 13   CREATININE 0.69 0.58 0.44  CALCIUM 8.5* 8.5* 8.0*   Liver Function Tests: Recent Labs  Lab 02/06/21 1226 02/07/21 1458  AST 30 29  ALT 22 19  ALKPHOS 344* 391*  BILITOT 0.6 0.8  PROT 6.9 7.4  ALBUMIN 3.5 3.7   Recent Labs  Lab 02/07/21 1833  LIPASE 23   No results for input(s): AMMONIA in the last 168 hours. CBC: Recent Labs  Lab 02/06/21 1226 02/07/21 1458 02/08/21 0523  WBC 5.9 5.8 5.1  NEUTROABS 4.1 4.7  --   HGB 13.6 14.4 13.4  HCT 40.7 41.6 39.8  MCV 92.7 89.5 92.1  PLT 195 210 196   Cardiac Enzymes: No results for input(s): CKTOTAL,  CKMB, CKMBINDEX, TROPONINI in the last 168 hours. BNP: Invalid input(s): POCBNP CBG: No results for input(s): GLUCAP in the last 168 hours. D-Dimer No results for input(s): DDIMER in the last 72 hours. Hgb A1c No results for input(s): HGBA1C in the last 72 hours. Lipid Profile No results for input(s): CHOL, HDL, LDLCALC, TRIG, CHOLHDL, LDLDIRECT in the last 72 hours. Thyroid function studies No results for input(s): TSH, T4TOTAL, T3FREE, THYROIDAB in the last 72 hours.  Invalid input(s): FREET3 Anemia work up No results for input(s): VITAMINB12, FOLATE, FERRITIN, TIBC, IRON, RETICCTPCT in the last 72 hours. Urinalysis    Component Value Date/Time   COLORURINE STRAW (A) 04/27/2020 0928   APPEARANCEUR CLEAR 04/27/2020 0928   LABSPEC 1.015 05/30/2020 1402   PHURINE 6.0 05/30/2020 1402   GLUCOSEU NEGATIVE 05/30/2020 1402   HGBUR NEGATIVE 05/30/2020 1402   BILIRUBINUR NEGATIVE 05/30/2020 1402   KETONESUR NEGATIVE 05/30/2020 1402   PROTEINUR NEGATIVE 05/30/2020 1402   UROBILINOGEN 0.2 05/30/2020 1402   NITRITE NEGATIVE 05/30/2020 1402   LEUKOCYTESUR NEGATIVE 05/30/2020 1402   Sepsis Labs Invalid input(s): PROCALCITONIN,  WBC,  LACTICIDVEN Microbiology Recent Results (from the past 240 hour(s))  Resp Panel by RT-PCR (Flu A&B, Covid) Nasopharyngeal Swab     Status: None   Collection Time: 02/07/21  9:01 PM   Specimen: Nasopharyngeal Swab; Nasopharyngeal(NP) swabs in vial transport medium  Result Value Ref Range Status   SARS Coronavirus 2 by RT PCR NEGATIVE NEGATIVE Final    Comment: (NOTE) SARS-CoV-2 target nucleic acids are NOT DETECTED.  The SARS-CoV-2 RNA is generally detectable in upper respiratory specimens during the acute phase of infection. The lowest concentration of SARS-CoV-2 viral copies this assay can detect is 138 copies/mL. A negative result does not preclude SARS-Cov-2 infection and should not be used as the sole basis for treatment or other patient  management decisions. A negative result may occur with  improper specimen collection/handling, submission of specimen other than nasopharyngeal swab, presence of viral mutation(s) within the areas targeted by this assay, and inadequate number of viral copies(<138 copies/mL). A negative result must be combined with clinical observations, patient history, and epidemiological information. The expected result is Negative.  Fact Sheet for Patients:  EntrepreneurPulse.com.au  Fact Sheet for Healthcare Providers:  IncredibleEmployment.be  This test is no t yet approved or cleared by the [place] FDA and  has been authorized for detection and/or diagnosis of SARS-CoV-2 by FDA under an Emergency Use Authorization (EUA). This EUA will remain  in effect (meaning this test can be used) for the duration of the COVID-19 declaration under Section 564(b)(1) of the Act, 21 U.S.C.section 360bbb-3(b)(1), unless the authorization is terminated  or revoked sooner.       Influenza A by PCR NEGATIVE NEGATIVE Final   Influenza B by PCR NEGATIVE NEGATIVE Final    Comment: (NOTE) The Xpert Xpress SARS-CoV-2/FLU/RSV plus assay is intended as an aid in the diagnosis of influenza from Nasopharyngeal swab specimens and should not be used as a sole basis for treatment. Nasal washings and aspirates are unacceptable for Xpert Xpress SARS-CoV-2/FLU/RSV testing.  Fact Sheet for Patients: EntrepreneurPulse.com.au  Fact Sheet for Healthcare Providers: IncredibleEmployment.be  This test is not yet approved or cleared by the [place] FDA and has been authorized for detection and/or diagnosis of SARS-CoV-2 by FDA under an Emergency Use Authorization (EUA). This EUA will remain in effect (meaning this test can be used) for the duration of the COVID-19 declaration under Section 564(b)(1) of the Act, 21 U.S.C. section 360bbb-3(b)(1),  unless the authorization is terminated or revoked.  Performed at [facility], [place], [place] [address], [place], [place] [address]      Patient was seen and examined on the day of discharge and was found to be in stable condition. Time coordinating discharge: 25 minutes including assessment and coordination of care, as well as examination of the patient.   SIGNED:  Dessa Phi, DO [facility] 02/09/2021, 8:52 AM

## 2021-02-09 NOTE — Progress Notes (Signed)
Discharge instructions discussed with patient, verbalized agreement and understanding 

## 2021-02-12 MED FILL — Dexamethasone Sodium Phosphate Inj 100 MG/10ML: INTRAMUSCULAR | Qty: 1 | Status: AC

## 2021-02-12 NOTE — Progress Notes (Signed)
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   Since her last visit, she presented to the ED on 02/07/2021 with intractable nausea and vomiting. She was admitted and her nausea was slightly better controlled by adding metoclopramide. During the admission she continued on methadone and MSIR as previously prescribed so we were able to document that the patient takes those medications correctly and it is indeed helping to control her pain  At this point, we are stopping the capecitabine. We planned to switch  her to cyclophosphamide, methotrexate, fluorouracil [CMF] chemotherapy, but the patient says that somebody, she says a pharmacist, told her that CMF was horrible and that people did terrible with the medication and she has decided she does not want active treatment at this point. She just wants symptom control.  She also met with our chaplain Catherine Tyler who told her about hospice and the patient is interested in proceeding to hospice referral.  We are following her tumor markers and alkaline phosphatase which have been increasing: Lab Results  Component Value Date   ALKPHOS 391 (H) 02/07/2021   ALKPHOS 344 (H) 02/06/2021   ALKPHOS 276 (H) 01/23/2021   ALKPHOS 227 (H) 01/02/2021   ALKPHOS 192 (H) 12/20/2020   Lab  Results  Component Value Date   CA2729 514.5 (H) 01/23/2021   CA2729 312.8 (H) 12/20/2020   CA2729 310.3 (H) 12/05/2020   CA2729 425.1 (H) 10/11/2020   CA2729 435.2 (H) 09/20/2020   She is scheduled for bone scan on 02/19/2021 and for brain MRI on 02/21/2021.   REVIEW OF SYSTEMS: Catherine Tyler continues to have significant vomiting issues. She tells me she is already vomited 5 times today. She was nauseated although did not vomit during our meeting today she continues to have pain in the right parietal area of her head. She does not have visual changes. Despite being on high doses of methadone she is managing to have small regular bowel movements because she is following the bowel prophylaxis regimen faithfully. A detailed review of systems today was otherwise stable   COVID 19 VACCINATION STATUS: Not vaccinated as of February 2022   HISTORY OF CURRENT ILLNESS: From the original intake note:  "Catherine Tyler" has a history of right breast cancer, diagnosed in 2007.  From her report, this appears to have been about 3 cm and to have involved 11 of the 13 right axillary lymph nodes involved.  She was treated at [facility] by Dr.  She was treated under Dr Epimenio Foot in [facility] with lumpectomy, chemotherapy (TAC x6) , and radiation therapy.  She then tried Femara which she did not tolerate and no further antiestrogens were prescribed.  She presented to the ED yesterday, 06/15/2020, with diffuse back, chest, and abdominal pain, as well as unintentional weight loss. Chest x-ray performed at that time showed: concern for bone metastasis with pathologic fracture of left 5th rib.  She proceeded to chest, abdomen, pelvis CT that day, which showed: multifocal predominantly blastic-appearing osseous metastatic disease involving the thoracic and lumbar spine, multiple ribs most prominently at the left fifth through seventh ribs, and the right sacral ala; nonspecific mixed solid nodules and ill-defined  ground-glass opacities in the left upper lobe measuring up to 7 mm and 6 mm in size; mild distal small bowel fecalization without evidence of obstruction.  Of note, her most recent mammogram on file is a bilateral diagnostic scan from 08/11/2018. At that time, she presented with a burning sensation to the inferior right breast. Mammogram showed: breast density category B; no mammographic evidence of malignancy in the bilateral breasts.   The patient's subsequent history is as detailed below.   PAST MEDICAL HISTORY: Past Medical History:  Diagnosis Date  . Cancer ([place])   . Personal history of chemotherapy   . Personal history of radiation therapy     PAST SURGICAL HISTORY: Past Surgical History:  Procedure Laterality Date  . BREAST LUMPECTOMY    . BREAST SURGERY      FAMILY HISTORY: Family History  Problem Relation Age of Onset  . Hyperlipidemia Mother   . Heart failure Mother   The patient's father died at age 36 from a myocardial infarction.  As far as the patient knows there is no history of cancer on that side of the family.  The patient's mother died at age 41 from renal failure.  The patient is not aware of any cancer on that side of the family.  The patient herself has 5 sisters, no brothers.  1 sister Catherine Tyler) had lung cancer.   GYNECOLOGIC HISTORY:  No LMP recorded (lmp unknown). Patient has had a hysterectomy. Menarche: 65 years old Age at first live birth: 65 years old [place] P 1 LMP status post hysterectomy at age 14 secondary to fibroids; status post remote conization for in situ cervical carcinoma BSO?    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  She lives by herself with her dog Catherine Tyler who is a rescue.  The patient has been divorced 13 years as of June 2021.  Her son Catherine Tyler lives in [place] where he works in Architect.  The patient has 1 grandchild.  She attends [employer].   ADVANCED DIRECTIVES: To be discussed   HEALTH MAINTENANCE: Social History   Tobacco Use  . Smoking status: Current Some Day Smoker  . Smokeless tobacco: Never Used  Vaping Use  . Vaping Use: Never used  Substance Use Topics  . Alcohol use: Yes    Comment: on occasion  . Drug use: Never     Colonoscopy: Never  PAP: 07/2018, negative  Bone density: Never   Allergies  Allergen Reactions  . Demerol [Meperidine] Nausea And Vomiting  . Sulfa Antibiotics Nausea And Vomiting  . Tape Other (See Comments)    Takes off skin    Current Outpatient Medications  Medication Sig Dispense Refill  . bisacodyl (DULCOLAX) 10 MG suppository Place 1 suppository (10 mg total) rectally as needed for moderate constipation. 12 suppository 0  . dexamethasone (DECADRON) 4 MG tablet Take 1 tablet (4 mg total) by mouth in the morning. 30 tablet 6  . fluconazole (DIFLUCAN) 100 MG tablet Take 1 tablet (100 mg total) by mouth daily. 30 tablet 3  . gabapentin (NEURONTIN) 300 MG capsule Take 600 mg by mouth 3 (three) times daily. [place]  capsule 0  . lisinopril (ZESTRIL) 10 MG tablet Take 1 tablet (10 mg total) by mouth daily. 30 tablet 11  . methadone (DOLOPHINE) 10 MG tablet Take 1.5 tablets (15 mg total) by mouth every 8 (eight) hours. 140 tablet 0  . metoCLOPramide (REGLAN) 5 MG tablet Take 1 tablet (5 mg total) by mouth every 8 (eight) hours as needed for up to 30 doses for nausea, vomiting or refractory nausea / vomiting. 30 tablet 0  . morphine (MSIR) 15 MG tablet Take 1 tablet (15 mg total) by mouth every 4 (four) hours as needed for severe pain. 180 tablet 0  . omeprazole (PRILOSEC) 40 MG capsule Take 1 capsule (40 mg total) by mouth daily. 90 capsule 3  . ondansetron (ZOFRAN ODT) 4 MG disintegrating tablet Take 1 tablet (4 mg total) by mouth every 8 (eight) hours as needed for nausea or vomiting. 20 tablet 0  . polyethylene glycol (MIRALAX / GLYCOLAX) 17 g packet Take 17 g by mouth daily.    .  prochlorperazine (COMPAZINE) 10 MG tablet Take 1 tablet (10 mg total) by mouth every 6 (six) hours as needed for nausea or vomiting. 30 tablet 3  . promethazine (PHENERGAN) 25 MG suppository Place 1 suppository (25 mg total) rectally every 6 (six) hours as needed for nausea or vomiting. 12 each 2  . senna-docusate (SENOKOT-S) 8.6-50 MG tablet Take 2 tablets by mouth 2 (two) times daily. 120 tablet 0  . sodium phosphate (FLEET) 7-19 GM/118ML ENEM Place 133 mLs (1 enema total) rectally daily as needed for severe constipation. 399 mL 0   No current facility-administered medications for this visit.    OBJECTIVE: White woman who was very nauseated during today's visit  Vitals:   02/13/21 1058  BP: (!) 106/58  Pulse: 76  Resp: 17  Temp: 97.9 F (36.6 C)  SpO2: 99%   Wt Readings from Last 3 Encounters:  02/13/21 128 lb 8 oz (58.3 kg)  02/07/21 124 lb 12.8 oz (56.6 kg)  02/06/21 128 lb 3.2 oz (58.2 kg)   Body mass index is 21.38 kg/m.    ECOG FS:2 - Symptomatic, <50% confined to bed  Sclerae unicteric, EOMs intact Wearing a mask Lungs no rales or rhonchi Heart regular rate and rhythm Abd soft, nontender, positive bowel sounds Neuro: nonfocal, well oriented, appropriate affect Breasts: Deferred   LAB RESULTS:  CMP     Component Value Date/Time   NA 136 02/08/2021 0523   NA 140 09/02/2018 1106   K 4.0 02/08/2021 0523   CL 104 02/08/2021 0523   CO2 23 02/08/2021 0523   GLUCOSE 104 (H) 02/08/2021 0523   BUN 13 02/08/2021 0523   BUN 22 09/02/2018 1106   CREATININE 0.44 02/08/2021 0523   CREATININE 0.58 02/07/2021 1458   CALCIUM 8.0 (L) 02/08/2021 0523   PROT 7.4 02/07/2021 1458   ALBUMIN 3.7 02/07/2021 1458   AST 29 02/07/2021 1458   ALT 19 02/07/2021 1458   ALKPHOS 391 (H) 02/07/2021 1458   BILITOT 0.8 02/07/2021 1458   GFRNONAA >60 02/08/2021 0523   GFRNONAA >60 02/07/2021 1458   GFRAA >60 09/20/2020 1444    No results found for: Ronnald Ramp, A1GS,  A2GS, BETS, BETA2SER, GAMS, MSPIKE, SPEI  Lab Results  Component Value Date   WBC 5.1 02/08/2021   NEUTROABS 4.7 02/07/2021   HGB 13.4 02/08/2021   HCT 39.8 02/08/2021   MCV 92.1 02/08/2021   PLT 196 02/08/2021    No results found for:  LABCA2  No components found for: [id number]  No results for input(s): INR in the last 168 hours.  No results found for: LABCA2  No results found for: CAN199  No results found for: CAN125  No results found for: WHQ759  Lab Results  Component Value Date   CA2729 514.5 (H) 01/23/2021    No components found for: HGQUANT  Lab Results  Component Value Date   CEA1 2,345.62 (H) 01/23/2021   /  CEA ([facility])  Date Value Ref Range Status  01/23/2021 2,345.62 (H) 0.00 - 5.00 ng/mL Final    Comment:    (NOTE) This test was performed using Architect's Chemiluminescent Microparticle Immunoassay. Values obtained from different assay methods cannot be used interchangeably. Please note that 5-10% of patients who smoke may see CEA levels up to 6.9 ng/mL. Performed at [facility], [place] [address] [place], [place] [address]      No results found for: AFPTUMOR  No results found for: CHROMOGRNA  No results found for: KPAFRELGTCHN, LAMBDASER, KAPLAMBRATIO (kappa/lambda light chains)  No results found for: HGBA, HGBA2QUANT, HGBFQUANT, HGBSQUAN (Hemoglobinopathy evaluation)   No results found for: LDH  No results found for: IRON, TIBC, IRONPCTSAT (Iron and TIBC)  No results found for: FERRITIN  Urinalysis    Component Value Date/Time   COLORURINE STRAW (A) 04/27/2020 0928   APPEARANCEUR CLEAR 04/27/2020 0928   LABSPEC 1.015 05/30/2020 1402   PHURINE 6.0 05/30/2020 1402   GLUCOSEU NEGATIVE 05/30/2020 1402   HGBUR NEGATIVE 05/30/2020 1402   BILIRUBINUR NEGATIVE 05/30/2020 1402   KETONESUR NEGATIVE 05/30/2020 1402   PROTEINUR NEGATIVE 05/30/2020 1402   UROBILINOGEN 0.2 05/30/2020 1402   NITRITE  NEGATIVE 05/30/2020 1402   LEUKOCYTESUR NEGATIVE 05/30/2020 1402    STUDIES: DG Abd 2 Views  Result Date: 01/23/2021 CLINICAL DATA:  Evaluate for bowel obstruction. Diffuse abdominal pain. EXAM: ABDOMEN - 2 VIEW COMPARISON:  05/30/2020 FINDINGS: The bowel gas pattern is normal. There is no evidence of free air. No radio-opaque calculi or other significant radiographic abnormality is seen. IMPRESSION: Negative. Electronically Signed   By: Kerby Moors M.D.   On: 01/23/2021 12:16     ELIGIBLE FOR AVAILABLE RESEARCH PROTOCOL: no  ASSESSMENT: 65 y.o. [place] woman   (1) status post right lumpectomy and axillary lymph node dissection in 2007 for a stage III invasive breast cancer, additional pathology details to be reviewed when available  (a) a total of 13 axillary lymph nodes were removed, 11 positive  (b) adjuvant chemotherapy consisted of docetaxel, doxorubicin and cyclophosphamide x6  (c) the patient received adjuvant radiation  (d) received letrozole briefly but did not tolerate it.  METASTATIC DISEASE: June 2021 (2) CT scans of the chest abdomen and pelvis 06/15/2020 shows widespread bony metastatic disease, no definitive visceral disease  (a) bone marrow biopsy  06/20/2020 nondiagnostic  (b) F 18 estradiol PET scan (cerianna) 07/31/2020 shows no estrogen uptake in the known bone lesions or elsewhere  (3) Capecitabine started on 08/24/2020 at 1500 mg twice daily, on radiation days only--with moderate palmar plantar erythrodysesthesia  (a) Xgeva started on 07/13/2020 given every 4 weeks  (b) capecitabine resumed 11/30/2020 at metronomic doses (1 g twice daily, with no breaks)  (c) discontinued as of 01/21/2021 with multiple side effects and a rising tumor marker  (4) palliative radiation 10/23/20 - 11/10/20 Site/dose:    1.  The left chest was treated to a dose of 37.5 Gy in 15 fractios using a 5-field 3D conformal technique. 2.  The right sacrum was treated to a dose of 37.5  Gy in 15 fractios using a 4-field isodose technique.  (5) pain management:  (a) started methadone 5 mg every 8 hours 11/30/2020, dose gradually increased to 15 mg every 8 hours starting 02/06/2021  (b) also on MSIR 15 mg as needed for breakthrough pain  (c) bowel prophylaxis: MiraLAX daily, stool softeners 2 tablets twice daily, prune juice and milk of magnesia  (6) cyclophosphamide, methotrexate, fluorouracil [CMF] chemotherapy was to start 02/13/2021, but patient decided against active treatment  (7) hospice referral placed 02/13/2021  (a) patient completed and notarized healthcare power of attorney document 02/13/2023, naming her Sister Catherine Tyler as her healthcare power of attorney  (b) out of facility DO NOT RESUSCITATE order written and given to the patient   PLAN: Dreamer in some ways is better.  Her pain is better controlled.  She has a good bowel prophylaxis regimen and she is following it appropriately.  The problem is her uncontrolled nausea.  I am really not sure what the cause of this is.  It could be narcotics, although she tolerated them well before.  It could be central nervous system disease and we have an MRI of the brain pending on 02/21/2021.  At this point what seems to be working for her is metoclopramide and she is taking that 3 times a day.  I explained that she can take Phenergan suppositories if she cannot keep the metoclopramide down.  She can also use Zofran and I am adding lorazepam for breakthrough nausea.  We will try to get her a little bit of fluid today.  We are placing a hospice referral.  We are discontinuing chemotherapy and denosumab/Xgeva.  I wrote a DO NOT RESUSCITATE out of facility order for her and gave it to her after discussing it and explained it.  Also she completed a healthcare power of attorney and we got that notarized for her.  I am going to contact her virtually on 02/22/2021 to give her the results of the brain MRI at that point.  Otherwise she  will return to see [place] on 02/27/2021 for further symptomatic support as needed  Total encounter time 35 minutes.Sarajane Jews C. Caulin Begley, MD 02/13/21 11:00 AM Medical Oncology and Hematology [facility] [place], [place] [address] Tel. [phone]    Fax. [phone]   I, Wilburn Mylar, am acting as scribe for Dr. Virgie Tyler. Michaline Kindig.  I, Lurline Del MD, have reviewed the above documentation for accuracy and completeness, and I agree with the above.   *Total Encounter Time as defined by the Centers for Medicare and Medicaid Services includes, in addition to the face-to-face time of a patient visit (documented in the note above) non-face-to-face time: obtaining and reviewing outside history, ordering and reviewing medications, tests or procedures, care coordination (communications with other health care professionals or caregivers) and documentation in the medical record.

## 2021-02-13 ENCOUNTER — Encounter: Payer: Self-pay | Admitting: General Practice

## 2021-02-13 ENCOUNTER — Other Ambulatory Visit: Payer: Self-pay | Admitting: *Deleted

## 2021-02-13 ENCOUNTER — Other Ambulatory Visit: Payer: Self-pay

## 2021-02-13 ENCOUNTER — Ambulatory Visit (HOSPITAL_COMMUNITY): Payer: Medicaid Other

## 2021-02-13 ENCOUNTER — Inpatient Hospital Stay: Payer: Medicaid Other

## 2021-02-13 ENCOUNTER — Other Ambulatory Visit (HOSPITAL_COMMUNITY): Payer: Medicaid Other

## 2021-02-13 ENCOUNTER — Inpatient Hospital Stay (HOSPITAL_BASED_OUTPATIENT_CLINIC_OR_DEPARTMENT_OTHER): Payer: Medicaid Other | Admitting: Oncology

## 2021-02-13 ENCOUNTER — Ambulatory Visit: Payer: Medicaid Other

## 2021-02-13 VITALS — BP 106/58 | HR 76 | Temp 97.9°F | Resp 17 | Ht 65.0 in | Wt 128.5 lb

## 2021-02-13 DIAGNOSIS — Z170 Estrogen receptor positive status [ER+]: Secondary | ICD-10-CM

## 2021-02-13 DIAGNOSIS — C50811 Malignant neoplasm of overlapping sites of right female breast: Secondary | ICD-10-CM

## 2021-02-13 DIAGNOSIS — C7951 Secondary malignant neoplasm of bone: Secondary | ICD-10-CM

## 2021-02-13 DIAGNOSIS — R112 Nausea with vomiting, unspecified: Secondary | ICD-10-CM

## 2021-02-13 DIAGNOSIS — G893 Neoplasm related pain (acute) (chronic): Secondary | ICD-10-CM | POA: Diagnosis not present

## 2021-02-13 DIAGNOSIS — Z7189 Other specified counseling: Secondary | ICD-10-CM

## 2021-02-13 MED ORDER — ONDANSETRON HCL 4 MG/2ML IJ SOLN
INTRAMUSCULAR | Status: AC
  Filled 2021-02-13: qty 4

## 2021-02-13 MED ORDER — ONDANSETRON HCL 4 MG/2ML IJ SOLN: 8.0000 mg | Freq: Once | INTRAMUSCULAR | Status: DC

## 2021-02-13 MED ORDER — ONDANSETRON HCL 4 MG/2ML IJ SOLN
8.0000 mg | Freq: Once | INTRAMUSCULAR | Status: AC
  Administered 2021-02-13: 8 mg via INTRAVENOUS

## 2021-02-13 MED ORDER — SODIUM CHLORIDE 0.9 % IV SOLN
Freq: Once | INTRAVENOUS | Status: DC
  Filled 2021-02-13: qty 250

## 2021-02-13 MED ORDER — SODIUM CHLORIDE 0.9 % IV SOLN
Freq: Once | INTRAVENOUS | Status: AC
  Filled 2021-02-13: qty 250

## 2021-02-13 MED ORDER — LORAZEPAM 0.5 MG PO TABS: 0.5000 mg | ORAL_TABLET | Freq: Three times a day (TID) | ORAL | 0 refills | Status: DC | PRN

## 2021-02-13 NOTE — Patient Instructions (Signed)
Rehydration, Adult Rehydration is the replacement of body fluids, salts, and minerals (electrolytes) that are lost during dehydration. Dehydration is when there is not enough water or other fluids in the body. This happens when you lose more fluids than you take in. Common causes of dehydration include:  Not drinking enough fluids. This can occur when you are ill or doing activities that require a lot of energy, especially in hot weather.  Conditions that cause loss of water or other fluids, such as diarrhea, vomiting, sweating, or urinating a lot.  Other illnesses, such as fever or infection.  Certain medicines, such as those that remove excess fluid from the body (diuretics). Symptoms of mild or moderate dehydration may include thirst, dry lips and mouth, and dizziness. Symptoms of severe dehydration may include increased heart rate, confusion, fainting, and not urinating. For severe dehydration, you may need to get fluids through an IV at the hospital. For mild or moderate dehydration, you can usually rehydrate at home by drinking certain fluids as told by your health care provider. What are the risks? Generally, rehydration is safe. However, taking in too much fluid (overhydration) can be a problem. This is rare. Overhydration can cause an electrolyte imbalance, kidney failure, or a decrease in salt (sodium) levels in the body. Supplies needed You will need an oral rehydration solution (ORS) if your health care provider tells you to use one. This is a drink to treat dehydration. It can be found in pharmacies and retail stores. How to rehydrate Fluids Follow instructions from your health care provider for rehydration. The kind of fluid and the amount you should drink depend on your condition. In general, you should choose drinks that you prefer.  If told by your health care provider, drink an ORS. ? Make an ORS by following instructions on the package. ? Start by drinking small amounts,  about  cup (120 mL) every 5-10 minutes. ? Slowly increase how much you drink until you have taken the amount recommended by your health care provider.  Drink enough clear fluids to keep your urine pale yellow. If you were told to drink an ORS, finish it first, then start slowly drinking other clear fluids. Drink fluids such as: ? Water. This includes sparkling water and flavored water. Drinking only water can lead to having too little sodium in your body (hyponatremia). Follow the advice of your health care provider. ? Water from ice chips you suck on. ? Fruit juice with water you add to it (diluted). ? Sports drinks. ? Hot or cold herbal teas. ? Broth-based soups. ? Milk or milk products. Food Follow instructions from your health care provider about what to eat while you rehydrate. Your health care provider may recommend that you slowly begin eating regular foods in small amounts.  Eat foods that contain a healthy balance of electrolytes, such as bananas, oranges, potatoes, tomatoes, and spinach.  Avoid foods that are greasy or contain a lot of sugar. In some cases, you may get nutrition through a feeding tube that is passed through your nose and into your stomach (nasogastric tube, or NG tube). This may be done if you have uncontrolled vomiting or diarrhea.   Beverages to avoid Certain beverages may make dehydration worse. While you rehydrate, avoid drinking alcohol.   How to tell if you are recovering from dehydration You may be recovering from dehydration if:  You are urinating more often than before you started rehydrating.  Your urine is pale yellow.  Your energy level   improves.  You vomit less frequently.  You have diarrhea less frequently.  Your appetite improves or returns to normal.  You feel less dizzy or less light-headed.  Your skin tone and color start to look more normal. Follow these instructions at home:  Take over-the-counter and prescription medicines only  as told by your health care provider.  Do not take sodium tablets. Doing this can lead to having too much sodium in your body (hypernatremia). Contact a health care provider if:  You continue to have symptoms of mild or moderate dehydration, such as: ? Thirst. ? Dry lips. ? Slightly dry mouth. ? Dizziness. ? Dark urine or less urine than normal. ? Muscle cramps.  You continue to vomit or have diarrhea. Get help right away if you:  Have symptoms of dehydration that get worse.  Have a fever.  Have a severe headache.  Have been vomiting and the following happens: ? Your vomiting gets worse or does not go away. ? Your vomit includes blood or green matter (bile). ? You cannot eat or drink without vomiting.  Have problems with urination or bowel movements, such as: ? Diarrhea that gets worse or does not go away. ? Blood in your stool (feces). This may cause stool to look black and tarry. ? Not urinating, or urinating only a small amount of very dark urine, within 6-8 hours.  Have trouble breathing.  Have symptoms that get worse with treatment. These symptoms may represent a serious problem that is an emergency. Do not wait to see if the symptoms will go away. Get medical help right away. Call your local emergency services (911 in the [place] Do not drive yourself to the hospital. Summary  Rehydration is the replacement of body fluids and minerals (electrolytes) that are lost during dehydration.  Follow instructions from your health care provider for rehydration. The kind of fluid and amount you should drink depend on your condition.  Slowly increase how much you drink until you have taken the amount recommended by your health care provider.  Contact your health care provider if you continue to show signs of mild or moderate dehydration. This information is not intended to replace advice given to you by your health care provider. Make sure you discuss any questions you have with  your health care provider. Document Revised: 02/16/2020 Document Reviewed: 12/27/2019 Elsevier Patient Education  2021 Elsevier Inc.  

## 2021-02-13 NOTE — Progress Notes (Signed)
Referral called to [facility] [place] per Dr. Jana Hakim

## 2021-02-13 NOTE — Progress Notes (Signed)
[place] Spiritual Care Note  Followed up with Catherine Tyler in [facility]. She reports having had good days on Saturday and Sunday, which lifted her spirits, as well as relief at getting many of her to-do items completed (Advance Directives, hospice referral, etc). She knows how to reach me directly in case additional needs arise.   [place], [place], [facility] Pager [phone] Voicemail [phone]

## 2021-02-14 ENCOUNTER — Other Ambulatory Visit: Payer: Self-pay

## 2021-02-14 ENCOUNTER — Telehealth: Payer: Self-pay | Admitting: Hematology and Oncology

## 2021-02-14 NOTE — Telephone Encounter (Signed)
Scheduled appts per 2/15 los. Pt confirmed appt date and time.  

## 2021-02-14 NOTE — Telephone Encounter (Signed)
Returned call to pt regarding pain med refill. Told pt medication refill had been sent in last week. Pt to call and check with pharmacy.

## 2021-02-16 ENCOUNTER — Other Ambulatory Visit: Payer: Self-pay | Admitting: Oncology

## 2021-02-19 ENCOUNTER — Other Ambulatory Visit: Payer: Self-pay

## 2021-02-19 ENCOUNTER — Emergency Department (HOSPITAL_COMMUNITY)
Admission: EM | Admit: 2021-02-19 | Discharge: 2021-02-20 | Disposition: A | Discharge status: Home / Self Care | Payer: Medicaid Other | Attending: Emergency Medicine | Admitting: Emergency Medicine

## 2021-02-19 ENCOUNTER — Encounter (HOSPITAL_COMMUNITY): Payer: Self-pay

## 2021-02-19 ENCOUNTER — Encounter (HOSPITAL_COMMUNITY): Admission: RE | Admit: 2021-02-19 | Payer: Medicaid Other | Source: Ambulatory Visit

## 2021-02-19 ENCOUNTER — Encounter (HOSPITAL_COMMUNITY): Payer: Medicaid Other

## 2021-02-19 ENCOUNTER — Emergency Department: Payer: Self-pay

## 2021-02-19 DIAGNOSIS — Z79899 Other long term (current) drug therapy: Secondary | ICD-10-CM | POA: Insufficient documentation

## 2021-02-19 DIAGNOSIS — Z85830 Personal history of malignant neoplasm of bone: Secondary | ICD-10-CM | POA: Insufficient documentation

## 2021-02-19 DIAGNOSIS — Z20822 Contact with and (suspected) exposure to covid-19: Secondary | ICD-10-CM | POA: Insufficient documentation

## 2021-02-19 DIAGNOSIS — Z853 Personal history of malignant neoplasm of breast: Secondary | ICD-10-CM | POA: Insufficient documentation

## 2021-02-19 DIAGNOSIS — F17200 Nicotine dependence, unspecified, uncomplicated: Secondary | ICD-10-CM | POA: Diagnosis not present

## 2021-02-19 DIAGNOSIS — R5381 Other malaise: Secondary | ICD-10-CM | POA: Insufficient documentation

## 2021-02-19 DIAGNOSIS — R112 Nausea with vomiting, unspecified: Secondary | ICD-10-CM | POA: Diagnosis not present

## 2021-02-19 DIAGNOSIS — I10 Essential (primary) hypertension: Secondary | ICD-10-CM | POA: Insufficient documentation

## 2021-02-19 LAB — COMPREHENSIVE METABOLIC PANEL
ALT: 30 U/L (ref 0–44)
AST: 47 U/L — ABNORMAL HIGH (ref 15–41)
Albumin: 3.6 g/dL (ref 3.5–5.0)
Alkaline Phosphatase: 375 U/L — ABNORMAL HIGH (ref 38–126)
Anion gap: 8 (ref 5–15)
BUN: 17 mg/dL (ref 8–23)
CO2: 24 mmol/L (ref 22–32)
Calcium: 8 mg/dL — ABNORMAL LOW (ref 8.9–10.3)
Chloride: 101 mmol/L (ref 98–111)
Creatinine, Ser: 0.41 mg/dL — ABNORMAL LOW (ref 0.44–1.00)
GFR, Estimated: >60 mL/min (ref >60)
Glucose, Bld: 79 mg/dL (ref 70–99)
Potassium: 3.7 mmol/L (ref 3.5–5.1)
Sodium: 133 mmol/L — ABNORMAL LOW (ref 135–145)
Total Bilirubin: 1.2 mg/dL (ref 0.3–1.2)
Total Protein: 7.1 g/dL (ref 6.5–8.1)

## 2021-02-19 LAB — CBC WITH DIFFERENTIAL/PLATELET
Abs Immature Granulocytes: 0.09 10*3/uL — ABNORMAL HIGH (ref 0.00–0.07)
Basophils Absolute: 0 10*3/uL (ref 0.0–0.1)
Basophils Relative: 1 %
Eosinophils Absolute: 0.1 10*3/uL (ref 0.0–0.5)
Eosinophils Relative: 2 %
HCT: 41.7 % (ref 36.0–46.0)
Hemoglobin: 14 g/dL (ref 12.0–15.0)
Immature Granulocytes: 2 %
Lymphocytes Relative: 19 %
Lymphs Abs: 1.2 10*3/uL (ref 0.7–4.0)
MCH: 30.6 pg (ref 26.0–34.0)
MCHC: 33.6 g/dL (ref 30.0–36.0)
MCV: 91.2 fL (ref 80.0–100.0)
Monocytes Absolute: 0.6 10*3/uL (ref 0.1–1.0)
Monocytes Relative: 10 %
Neutro Abs: 4.1 10*3/uL (ref 1.7–7.7)
Neutrophils Relative %: 66 %
Platelets: 192 10*3/uL (ref 150–400)
RBC: 4.57 MIL/uL (ref 3.87–5.11)
RDW: 13.5 % (ref 11.5–15.5)
WBC: 6.1 10*3/uL (ref 4.0–10.5)
nRBC: 0 % (ref 0.0–0.2)

## 2021-02-19 MED ORDER — PANTOPRAZOLE SODIUM 40 MG PO TBEC
40.0000 mg | DELAYED_RELEASE_TABLET | Freq: Every day | ORAL | Status: DC
  Administered 2021-02-19: 40 mg via ORAL
  Filled 2021-02-19: qty 1

## 2021-02-19 MED ORDER — METHADONE HCL 5 MG PO TABS
15.0000 mg | ORAL_TABLET | Freq: Three times a day (TID) | ORAL | Status: DC
  Administered 2021-02-19 – 2021-02-20 (×2): 15 mg via ORAL
  Filled 2021-02-19 (×2): qty 3

## 2021-02-19 MED ORDER — GABAPENTIN 300 MG PO CAPS
300.0000 mg | ORAL_CAPSULE | Freq: Three times a day (TID) | ORAL | Status: DC
  Administered 2021-02-19: 300 mg via ORAL
  Filled 2021-02-19: qty 1

## 2021-02-19 MED ORDER — FLUCONAZOLE 100 MG PO TABS
100.0000 mg | ORAL_TABLET | Freq: Every day | ORAL | Status: DC
  Administered 2021-02-19: 100 mg via ORAL
  Filled 2021-02-19 (×2): qty 1

## 2021-02-19 MED ORDER — SODIUM CHLORIDE 0.9% FLUSH: 10.0000 mL | INTRAVENOUS | Status: DC | PRN

## 2021-02-19 MED ORDER — MORPHINE SULFATE 15 MG PO TABS: 15.0000 mg | ORAL_TABLET | ORAL | Status: DC | PRN

## 2021-02-19 MED ORDER — ONDANSETRON HCL 4 MG/2ML IJ SOLN
4.0000 mg | Freq: Once | INTRAMUSCULAR | Status: AC
  Administered 2021-02-19: 4 mg via INTRAVENOUS
  Filled 2021-02-19: qty 2

## 2021-02-19 MED ORDER — SODIUM CHLORIDE 0.9 % IV BOLUS
1000.0000 mL | Freq: Once | INTRAVENOUS | Status: AC
  Administered 2021-02-19: 1000 mL via INTRAVENOUS

## 2021-02-19 MED ORDER — ONDANSETRON 4 MG PO TBDP
4.0000 mg | ORAL_TABLET | Freq: Three times a day (TID) | ORAL | Status: DC | PRN
  Administered 2021-02-19: 4 mg via ORAL
  Filled 2021-02-19: qty 1

## 2021-02-19 MED ORDER — SENNOSIDES-DOCUSATE SODIUM 8.6-50 MG PO TABS
2.0000 | ORAL_TABLET | Freq: Two times a day (BID) | ORAL | Status: DC
  Administered 2021-02-19: 2 via ORAL
  Filled 2021-02-19: qty 2

## 2021-02-19 MED ORDER — DEXAMETHASONE 4 MG PO TABS
4.0000 mg | ORAL_TABLET | Freq: Every morning | ORAL | Status: DC
  Administered 2021-02-20: 4 mg via ORAL
  Filled 2021-02-19: qty 1

## 2021-02-19 MED ORDER — METOCLOPRAMIDE HCL 10 MG PO TABS: 5.0000 mg | ORAL_TABLET | Freq: Three times a day (TID) | ORAL | Status: DC | PRN

## 2021-02-19 MED ORDER — LORAZEPAM 0.5 MG PO TABS
0.5000 mg | ORAL_TABLET | Freq: Three times a day (TID) | ORAL | Status: DC | PRN
  Administered 2021-02-19 – 2021-02-20 (×2): 0.5 mg via ORAL
  Filled 2021-02-19 (×2): qty 1

## 2021-02-19 MED ORDER — CHLORHEXIDINE GLUCONATE CLOTH 2 % EX PADS
6.0000 | MEDICATED_PAD | Freq: Every day | CUTANEOUS | Status: DC
  Administered 2021-02-19: 6 via TOPICAL

## 2021-02-19 MED ORDER — SODIUM CHLORIDE 0.9% FLUSH
10.0000 mL | Freq: Two times a day (BID) | INTRAVENOUS | Status: DC
  Administered 2021-02-19 (×2): 10 mL

## 2021-02-19 NOTE — Progress Notes (Signed)
Manufacturing engineer [facility])        This patient was in an admission visit with [facility] admission RN when pt was found to be refractory nausea/vomiting, unable to take pain meds or antiemetics.  Phenergan PR not effective.  ACC MD was consulted, and referred pt to [facility] for portacath or PICC placement.  With a line pt could be taken to [facility] for symptom management.  Dr. Jana Hakim aware, reported plan to see pt at [facility].  [facility] liaisons will continue to follow for any discharge planning needs and to coordinate admission onto palliative care.    Thank you for the opportunity to participate in this patient's care.     Domenic Moras, BSN, RN [facility] Liaison   [phone]

## 2021-02-19 NOTE — ED Triage Notes (Signed)
EMS reports from home, [place] and Hospice Pt, hospice nurse states sent by Oncologist for PICC line placement.  BP 152/74 HR 84 RR 18 Sp02 98 RA

## 2021-02-19 NOTE — ED Provider Notes (Signed)
[facility] DEPT Provider Note   CSN: [id number] Arrival date & time: 02/19/21  1200     History Chief Complaint  Patient presents with  . PICC line placement    Catherine Tyler is a 65 y.o. female.  Patient presents with chief complaint of nausea malaise and vomiting.  She has a history of metastatic cancer being followed by hospice care.  Otherwise denies any diarrhea.  Denies any fevers or cough or chest pain.  She saw her oncology team and was sent to the ER for a PICC line and hydration and possible admission for hospice care.        Past Medical History:  Diagnosis Date  . Cancer ([place])   . Personal history of chemotherapy   . Personal history of radiation therapy     Patient Active Problem List   Diagnosis Date Noted  . Intractable vomiting with nausea 02/07/2021  . Malignant neoplasm of overlapping sites of right breast in female, estrogen receptor positive ([place]) 06/17/2020  . Bone metastases ([place]) 06/17/2020  . Pain from bone metastases ([place]) 06/17/2020  . Constipation due to pain medication 06/02/2020  . Hypertension 09/02/2018  . Depression 09/02/2018  . Need for immunization against influenza 09/02/2018  . Goals of care, counseling/discussion 09/02/2018  . Dyslipidemia 09/02/2018    Past Surgical History:  Procedure Laterality Date  . BREAST LUMPECTOMY    . BREAST SURGERY       OB History    Gravida  4   Para      Term      Preterm      AB  2   Living  1     SAB      IAB  2   Ectopic      Multiple      Live Births  1           Family History  Problem Relation Age of Onset  . Hyperlipidemia Mother   . Heart failure Mother     Social History   Tobacco Use  . Smoking status: Current Some Day Smoker  . Smokeless tobacco: Never Used  Vaping Use  . Vaping Use: Never used  Substance Use Topics  . Alcohol use: Yes    Comment: on occasion  . Drug use: Never    Home Medications Prior to  Admission medications   Medication Sig Start Date End Date Taking? Authorizing Provider  bisacodyl (DULCOLAX) 10 MG suppository Place 1 suppository (10 mg total) rectally as needed for moderate constipation. 09/20/20   Causey, Charlestine Massed, NP  dexamethasone (DECADRON) 4 MG tablet Take 1 tablet (4 mg total) by mouth in the morning. 02/06/21   Magrinat, Virgie Dad, MD  fluconazole (DIFLUCAN) 100 MG tablet Take 1 tablet (100 mg total) by mouth daily. 02/06/21   Magrinat, Virgie Dad, MD  gabapentin (NEURONTIN) 300 MG capsule TAKE 3 CAPSULES BY MOUTH 4 TIMES A DAY 02/19/21   Magrinat, Virgie Dad, MD  lisinopril (ZESTRIL) 10 MG tablet Take 1 tablet (10 mg total) by mouth daily. 07/11/20 07/11/21  [place], Carley D, DO  LORazepam (ATIVAN) 0.5 MG tablet Take 1 tablet (0.5 mg total) by mouth every 8 (eight) hours as needed for anxiety (nausea). 02/13/21   Magrinat, Virgie Dad, MD  methadone (DOLOPHINE) 10 MG tablet Take 1.5 tablets (15 mg total) by mouth every 8 (eight) hours. 02/06/21   Magrinat, Virgie Dad, MD  metoCLOPramide (REGLAN) 5 MG tablet Take 1 tablet (5 mg  total) by mouth every 8 (eight) hours as needed for up to 30 doses for nausea, vomiting or refractory nausea / vomiting. 02/09/21 03/09/21  Dessa Phi, DO  morphine (MSIR) 15 MG tablet Take 1 tablet (15 mg total) by mouth every 4 (four) hours as needed for severe pain. 01/31/21   Magrinat, Virgie Dad, MD  omeprazole (PRILOSEC) 40 MG capsule Take 1 capsule (40 mg total) by mouth daily. 08/17/20   Gardenia Phlegm, NP  ondansetron (ZOFRAN ODT) 4 MG disintegrating tablet Take 1 tablet (4 mg total) by mouth every 8 (eight) hours as needed for nausea or vomiting. 04/27/20   Deno Etienne, DO  polyethylene glycol (MIRALAX / GLYCOLAX) 17 g packet Take 17 g by mouth daily.    [provider]  prochlorperazine (COMPAZINE) 10 MG tablet Take 1 tablet (10 mg total) by mouth every 6 (six) hours as needed for nausea or vomiting. 01/23/21   Tanner, Lyndon Code., PA-C   promethazine (PHENERGAN) 25 MG suppository Place 1 suppository (25 mg total) rectally every 6 (six) hours as needed for nausea or vomiting. 01/23/21   Tanner, Lyndon Code., PA-C  senna-docusate (SENOKOT-S) 8.6-50 MG tablet Take 2 tablets by mouth 2 (two) times daily. 09/20/20   Gardenia Phlegm, NP  sodium phosphate (FLEET) 7-19 GM/118ML ENEM Place 133 mLs (1 enema total) rectally daily as needed for severe constipation. 09/20/20   Gardenia Phlegm, NP    Allergies    Demerol [meperidine], Sulfa antibiotics, and Tape  Review of Systems   Review of Systems  Constitutional: Negative for fever.  HENT: Negative for ear pain.   Eyes: Negative for pain.  Respiratory: Negative for cough.   Cardiovascular: Negative for chest pain.  Gastrointestinal: Positive for vomiting.  Skin: Negative for rash.  Neurological: Negative for headaches.    Physical Exam Updated Vital Signs BP (!) 172/88   Pulse 82   Temp 97.7 F (36.5 C) (Oral)   Resp 16   LMP  (LMP Unknown)   SpO2 97%   Physical Exam Constitutional:      General: She is not in acute distress.    Appearance: Normal appearance.  HENT:     Head: Normocephalic.     Nose: Nose normal.  Eyes:     Extraocular Movements: Extraocular movements intact.  Cardiovascular:     Rate and Rhythm: Normal rate.  Pulmonary:     Effort: Pulmonary effort is normal.  Musculoskeletal:        General: Normal range of motion.     Cervical back: Normal range of motion.  Neurological:     General: No focal deficit present.     Mental Status: She is alert. Mental status is at baseline.     ED Results / Procedures / Treatments   Labs (all labs ordered are listed, but only abnormal results are displayed) Labs Reviewed  CBC WITH DIFFERENTIAL/PLATELET - Abnormal; Notable for the following components:      Result Value   Abs Immature Granulocytes 0.09 (*)    All other components within normal limits  COMPREHENSIVE METABOLIC PANEL -  Abnormal; Notable for the following components:   Sodium 133 (*)    Creatinine, Ser 0.41 (*)    Calcium 8.0 (*)    AST 47 (*)    Alkaline Phosphatase 375 (*)    All other components within normal limits  SARS CORONAVIRUS 2 (TAT 6-24 HRS)    EKG None  Radiology [place] EKG SITE RITE  Result Date:  02/19/2021 If Site Rite image not attached, placement could not be confirmed due to current cardiac rhythm.   Procedures Procedures   Medications Ordered in ED Medications  sodium chloride flush (NS) 0.9 % injection 10-40 mL (has no administration in time range)  sodium chloride flush (NS) 0.9 % injection 10-40 mL (has no administration in time range)  Chlorhexidine Gluconate Cloth 2 % PADS 6 each (has no administration in time range)  sodium chloride 0.9 % bolus 1,000 mL (1,000 mLs Intravenous New Bag/Given 02/19/21 1513)  ondansetron (ZOFRAN) injection 4 mg (4 mg Intravenous Given 02/19/21 1513)    ED Course  I have reviewed the triage vital signs and the nursing notes.  Pertinent labs & imaging results that were available during my care of the patient were reviewed by me and considered in my medical decision making (see chart for details).    MDM Rules/Calculators/A&P                          Labs within normal limits.  Patient given IV hydration and Zofran.  Still having nausea.  Case discussed with hospice team.  [facility] and recommending she stays in the ER until the transferred to [facility] tomorrow.   Final Clinical Impression(s) / ED Diagnoses Final diagnoses:  Intractable nausea and vomiting    Rx / DC Orders ED Discharge Orders    None       Luna Fuse, MD 02/19/21 [phone]

## 2021-02-19 NOTE — Progress Notes (Signed)
Peripherally Inserted Central Catheter Placement  The IV Nurse has discussed with the patient and/or persons authorized to consent for the patient, the purpose of this procedure and the potential benefits and risks involved with this procedure.  The benefits include less needle sticks, lab draws from the catheter, and the patient may be discharged home with the catheter. Risks include, but not limited to, infection, bleeding, blood clot (thrombus formation), and puncture of an artery; nerve damage and irregular heartbeat and possibility to perform a PICC exchange if needed/ordered by physician.  Alternatives to this procedure were also discussed.  Bard Power PICC patient education guide, fact sheet on infection prevention and patient information card has been provided to patient /or left at bedside.    PICC Placement Documentation  PICC Single Lumen 73/66/81 PICC Left Basilic 38 cm 0 cm (Active)  Indication for Insertion or Continuance of Line Home intravenous therapies (PICC only) 02/19/21 1445  Exposed Catheter (cm) 0 cm 02/19/21 1445  Site Assessment Clean;Dry;Intact 02/19/21 1445  Line Status Flushed;Saline locked;Blood return noted 02/19/21 1445  Dressing Type Transparent;Securing device 02/19/21 1445  Dressing Status Clean;Dry;Intact 02/19/21 1445  Antimicrobial disc in place? Yes 02/19/21 1445  Safety Lock Not Applicable 59/47/07 6151  Dressing Intervention New dressing 02/19/21 1445  Dressing Change Due 02/26/21 02/19/21 [place], Carrel Leather 02/19/2021, 3:04 PM

## 2021-02-20 ENCOUNTER — Other Ambulatory Visit: Payer: Self-pay | Admitting: Oncology

## 2021-02-20 ENCOUNTER — Ambulatory Visit: Payer: Medicaid Other | Admitting: Oncology

## 2021-02-20 ENCOUNTER — Other Ambulatory Visit: Payer: Medicaid Other

## 2021-02-20 ENCOUNTER — Ambulatory Visit: Payer: Medicaid Other

## 2021-02-20 LAB — SARS CORONAVIRUS 2 (TAT 6-24 HRS): SARS Coronavirus 2: NEGATIVE

## 2021-02-20 NOTE — Progress Notes (Signed)
Manufacturing engineer [facility]) Hospital Liaison note.    Received request from [place] for family interest in [facility]. Chart reviewed and eligibility confirmed. Spoke with family to confirm interest and explain services. Family agreeable to transfer today. TOC aware.    Registration paperwork has been completed. Please arrange transport at your convenience.   RN please call report to [phone]. If the pt is a DNR, please be sure the [facility] form transports with the patient.  Thank you for the opportunity to participate in this patient's care.  Domenic Moras, BSN, RN [facility] Liaison (listed on [place] under [facility]ice[facility])    [phone]  (24h on call)

## 2021-02-20 NOTE — Progress Notes (Signed)
..   Transition of Care [facility]) - Emergency Department Mini Assessment   Patient Details  Name: Catherine Tyler MRN: [id number] Date of Birth: 04/04/1956  Transition of [facility]) CM/SW Contact:    Gaetano Hawthorne Tarpley-Carter, LCSWA Phone Number: 02/20/2021, 10:15 AM   Clinical Narrative: TOC CM/CSW spoke with pts POA/sister/[name] [phone].  [name] had concerns about pts dc plan and transport to [facility].    [name] Tarpley-Carter, MSW, LCSW-A Pronouns:  She, Her, Hers                  [name] ED Transitions of CareClinical Social Worker [email or web address] [phone]   ED Mini Assessment: What brought you to the Emergency Department? : Hospice pt  Barriers to Discharge: No Barriers Identified     Means of departure: Ambulance  Interventions which prevented an admission or readmission: Transportation Screening    Patient Contact and Communications Key Contact 1: Maureen Chatters   Spoke with: Maureen Chatters Contact Date: 02/20/21,     Contact Phone Number: [phone] Call outcome: [name] is concerned with pts transport to [facility] and wanted an update on the process.  Patient states their goals for this hospitalization and ongoing recovery are:: To obtain transport to [facility]. CMS Medicare.gov Compare Post Acute Care list provided to:: Patient Choice offered to / list presented to : Adult Children,Patient  Admission diagnosis:  ca pt;picc line placement Patient Active Problem List   Diagnosis Date Noted  . Intractable vomiting with nausea 02/07/2021  . Malignant neoplasm of overlapping sites of right breast in female, estrogen receptor positive ([place]) 06/17/2020  . Bone metastases ([place]) 06/17/2020  . Pain from bone metastases ([place]) 06/17/2020  . Constipation due to pain medication 06/02/2020  . Hypertension 09/02/2018  . Depression 09/02/2018  . Need for immunization against influenza 09/02/2018  . Goals of care,  counseling/discussion 09/02/2018  . Dyslipidemia 09/02/2018   PCP:  Patient, No Pcp Per Pharmacy:   [id number] [place], [place] [address] [place] [address] Phone: [phone] Fax: [phone]  [place], [place] [address] Phone: [phone] Fax: [phone]  [id number] [place], [place] [address] Phone: [phone] Fax: [phone]

## 2021-02-20 NOTE — ED Notes (Signed)
[facility] called for transport.  

## 2021-02-20 NOTE — ED Notes (Signed)
RN is aware of Resp begin very low

## 2021-02-20 NOTE — Progress Notes (Signed)
TOC CM/CSW received a call from [place] [facility] [phone].  Christlyn stated that [facility] was ready for pt to be transported.  [name], RN was notified by CSW of this information.  CSW will continue to follow for dc needs.  Andric Kerce Tarpley-Carter, MSW, LCSW-A Pronouns:  She, Her, [place] ED Transitions of CareClinical Social Worker [email or web address] [phone]

## 2021-02-20 NOTE — Progress Notes (Signed)
TOC CM/CSW attempted to contact [facility] [phone].  CSW left HIPPA compliant message with my contact information.  CSW will continue to follow for dc needs.  Jenika Chiem Tarpley-Carter, MSW, LCSW-A Pronouns:  She, Her, Hers                  [place] ED Transitions of CareClinical Social Worker [email or web address] [phone]

## 2021-02-20 NOTE — Progress Notes (Unsigned)
COURTESY NOTE: Visited patient in ED; she is conscious and comfortable; tells me she vomited >7 times yesterday but also "kept down a ham sandwich." "I don't know what to do." She is scheduled for transfer [facility] today. Sister not in room  Greatly appreciate your help to this patient.  GM

## 2021-02-20 NOTE — Progress Notes (Signed)
TOC CM/CSW was notified by [name], RN that Catherine Tyler has been called and call report has been reported.  CSW will continue to follow for dc needs.  Catherine Tyler, MSW, LCSW-A Pronouns:  She, Her, Hers                  [place] ED Transitions of CareClinical Social Worker [email or web address] [phone]

## 2021-02-21 ENCOUNTER — Telehealth: Payer: Self-pay

## 2021-02-21 ENCOUNTER — Ambulatory Visit (HOSPITAL_COMMUNITY): Payer: Medicaid Other

## 2021-02-21 NOTE — Progress Notes (Signed)
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 She was discharged to [facility] Results  Component Value Date   ALKPHOS 375 (H) 02/19/2021   ALKPHOS 391 (H) 02/07/2021   ALKPHOS 344 (H) 02/06/2021   ALKPHOS 276 (H) 01/23/2021   ALKPHOS 227 (H) 01/02/2021   Lab Results  Component Value Date   CA2729 514.5 (H) 01/23/2021   CA2729 312.8 (H) 12/20/2020   CA2729 310.3 (H) 12/05/2020   CA2729 425.1 (H) 10/11/2020   CA2729 435.2 (H) 09/20/2020    REVIEW OF SYSTEMS: [place]    COVID 19 VACCINATION STATUS: Not vaccinated as of February 2022   HISTORY OF CURRENT ILLNESS: From the original intake note:  "[name]" has a history of right breast cancer, diagnosed in 2007.  From her report, this appears to have been about 3 cm and to have involved 11 of the 13 right axillary lymph nodes involved.  She was treated at [facility] by Dr.  She was treated under Dr Epimenio Foot in [facility] with lumpectomy, chemotherapy (TAC x6) , and radiation therapy.  She then tried Femara which she did not tolerate and no further antiestrogens were prescribed.  She presented to the ED yesterday, 06/15/2020, with diffuse back, chest, and abdominal pain, as well as unintentional weight loss. Chest x-ray performed at that time  showed: concern for bone metastasis with pathologic fracture of left 5th rib.  She proceeded to chest, abdomen, pelvis CT that day, which showed: multifocal predominantly blastic-appearing osseous metastatic disease involving the thoracic and lumbar spine, multiple ribs most prominently at the left fifth through seventh ribs, and the right sacral ala; nonspecific mixed solid nodules and ill-defined ground-glass opacities in the left upper lobe measuring up to 7 mm and 6 mm in size; mild distal small bowel fecalization without evidence of obstruction.  Of note, her most recent mammogram on file is a bilateral diagnostic scan from 08/11/2018. At that time, she presented with a burning sensation to the inferior right breast. Mammogram showed: breast density category B; no mammographic evidence of malignancy in the bilateral breasts.   The patient's subsequent history is as detailed below.   PAST MEDICAL HISTORY: Past Medical History:  Diagnosis Date   Cancer [facility])    Personal history of chemotherapy    Personal history of radiation therapy     PAST SURGICAL HISTORY: Past Surgical History:  Procedure Laterality Date   BREAST LUMPECTOMY     BREAST SURGERY      FAMILY HISTORY: Family History  Problem Relation Age of Onset   Hyperlipidemia Mother    Heart failure Mother   The patient's father died at age 34 from a myocardial infarction.  As far as the patient knows there is no history of cancer on that side of the family.  The patient's mother died at age 48 from renal failure.  The  patient is not aware of any cancer on that side of the family.  The patient herself has 5 sisters, no brothers.  1 sister [name]) had lung cancer.   GYNECOLOGIC HISTORY:  No LMP recorded (lmp unknown). Patient has had a hysterectomy. Menarche: 65 years old Age at first live birth: 65 years old [place] P 1 LMP status post hysterectomy at age 25 secondary to fibroids; status post remote conization for in situ  cervical carcinoma BSO?    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  She lives by herself with her dog [name] who is a rescue.  The patient has been divorced 13 years as of June 2021.  Her son Catherine Tyler lives in [place] where he works in Architect.  The patient has 1 grandchild.  She attends [employer].   ADVANCED DIRECTIVES: To be discussed   HEALTH MAINTENANCE: Social History   Tobacco Use   Smoking status: Current Some Day Smoker   Smokeless tobacco: Never Used  Vaping Use   Vaping Use: Never used  Substance Use Topics   Alcohol use: Yes    Comment: on occasion   Drug use: Never     Colonoscopy: Never  PAP: 07/2018, negative  Bone density: Never   Allergies  Allergen Reactions   Demerol [Meperidine] Nausea And Vomiting   Sulfa Antibiotics Nausea And Vomiting   Tape Other (See Comments)    Takes off skin    Current Outpatient Medications  Medication Sig Dispense Refill   bisacodyl (DULCOLAX) 10 MG suppository Place 1 suppository (10 mg total) rectally as needed for moderate constipation. (Patient not taking: Reported on 02/19/2021) 12 suppository 0   dexamethasone (DECADRON) 4 MG tablet Take 1 tablet (4 mg total) by mouth in the morning. 30 tablet 6   fluconazole (DIFLUCAN) 100 MG tablet Take 1 tablet (100 mg total) by mouth daily. 30 tablet 3   gabapentin (NEURONTIN) 300 MG capsule TAKE 3 CAPSULES BY MOUTH 4 TIMES A DAY (Patient taking differently: Take 300 mg by mouth 3 (three) times daily.) 360 capsule 0   lisinopril (ZESTRIL) 10 MG tablet Take 1 tablet (10 mg total) by mouth daily. (Patient not taking: No sig reported) 30 tablet 11   LORazepam (ATIVAN) 0.5 MG tablet Take 1 tablet (0.5 mg total) by mouth every 8 (eight) hours as needed for anxiety (nausea). 30 tablet 0   methadone (DOLOPHINE) 10 MG tablet Take 1.5 tablets (15 mg total) by mouth every 8 (eight) hours. 140  tablet 0   metoCLOPramide (REGLAN) 5 MG tablet Take 1 tablet (5 mg total) by mouth every 8 (eight) hours as needed for up to 30 doses for nausea, vomiting or refractory nausea / vomiting. 30 tablet 0   morphine (MSIR) 15 MG tablet Take 1 tablet (15 mg total) by mouth every 4 (four) hours as needed for severe pain. 180 tablet 0   omeprazole (PRILOSEC) 40 MG capsule Take 1 capsule (40 mg total) by mouth daily. 90 capsule 3   ondansetron (ZOFRAN ODT) 4 MG disintegrating tablet Take 1 tablet (4 mg total) by mouth every 8 (eight) hours as needed for nausea or vomiting. 20 tablet 0   polyethylene glycol (MIRALAX / GLYCOLAX) 17 g packet Take 17 g by mouth daily as needed for mild constipation.     prochlorperazine (COMPAZINE) 10 MG tablet Take 1 tablet (10 mg total) by mouth every 6 (six) hours as  needed for nausea or vomiting. 30 tablet 3   promethazine (PHENERGAN) 25 MG suppository Place 1 suppository (25 mg total) rectally every 6 (six) hours as needed for nausea or vomiting. 12 each 2   senna-docusate (SENOKOT-S) 8.6-50 MG tablet Take 2 tablets by mouth 2 (two) times daily. 120 tablet 0   sodium phosphate (FLEET) 7-19 GM/118ML ENEM Place 133 mLs (1 enema total) rectally daily as needed for severe constipation. 399 mL 0   No current facility-administered medications for this visit.    OBJECTIVE:   There were no vitals filed for this visit. Wt Readings from Last 3 Encounters:  02/13/21 128 lb 8 oz (58.3 kg)  02/07/21 124 lb 12.8 oz (56.6 kg)  02/06/21 128 lb 3.2 oz (58.2 kg)   There is no height or weight on file to calculate BMI.    LAB RESULTS:  CMP     Component Value Date/Time   NA 133 (L) 02/19/2021 1511   NA 140 09/02/2018 1106   K 3.7 02/19/2021 1511   CL 101 02/19/2021 1511   CO2 24 02/19/2021 1511   GLUCOSE 79 02/19/2021 1511   BUN 17 02/19/2021 1511   BUN 22 09/02/2018 1106   CREATININE 0.41 (L) 02/19/2021 1511   CREATININE 0.58 02/07/2021 1458   CALCIUM 8.0 (L)  02/19/2021 1511   PROT 7.1 02/19/2021 1511   ALBUMIN 3.6 02/19/2021 1511   AST 47 (H) 02/19/2021 1511   AST 29 02/07/2021 1458   ALT 30 02/19/2021 1511   ALT 19 02/07/2021 1458   ALKPHOS 375 (H) 02/19/2021 1511   BILITOT 1.2 02/19/2021 1511   BILITOT 0.8 02/07/2021 1458   GFRNONAA >60 02/19/2021 1511   GFRNONAA >60 02/07/2021 1458   GFRAA >60 09/20/2020 1444    No results found for: TOTALPROTELP, ALBUMINELP, A1GS, A2GS, BETS, BETA2SER, GAMS, MSPIKE, SPEI  Lab Results  Component Value Date   WBC 6.1 02/19/2021   NEUTROABS 4.1 02/19/2021   HGB 14.0 02/19/2021   HCT 41.7 02/19/2021   MCV 91.2 02/19/2021   PLT 192 02/19/2021    No results found for: LABCA2  No components found for: SVXBLT903  No results for input(s): INR in the last 168 hours.  No results found for: LABCA2  No results found for: CAN199  No results found for: CAN125  No results found for: ESP233  Lab Results  Component Value Date   CA2729 514.5 (H) 01/23/2021    No components found for: HGQUANT  Lab Results  Component Value Date   CEA1 2,345.62 (H) 01/23/2021   /  CEA ([facility])  Date Value Ref Range Status  01/23/2021 2,345.62 (H) 0.00 - 5.00 ng/mL Final    Comment:    (NOTE) This test was performed using Architect's Chemiluminescent Microparticle Immunoassay. Values obtained from different assay methods cannot be used interchangeably. Please note that 5-10% of patients who smoke may see CEA levels up to 6.9 ng/mL. Performed at [facility], [place] [address] [place], [place] [address]      No results found for: AFPTUMOR  No results found for: CHROMOGRNA  No results found for: KPAFRELGTCHN, LAMBDASER, KAPLAMBRATIO (kappa/lambda light chains)  No results found for: HGBA, HGBA2QUANT, HGBFQUANT, HGBSQUAN (Hemoglobinopathy evaluation)   No results found for: LDH  No results found for: IRON, TIBC, IRONPCTSAT (Iron and TIBC)  No results found for:  FERRITIN  Urinalysis    Component Value Date/Time   COLORURINE STRAW (A) 04/27/2020 [place] 04/27/2020  0928   LABSPEC 1.015 05/30/2020 1402   PHURINE 6.0 05/30/2020 1402   GLUCOSEU NEGATIVE 05/30/2020 1402   HGBUR NEGATIVE 05/30/2020 1402   BILIRUBINUR NEGATIVE 05/30/2020 1402   KETONESUR NEGATIVE 05/30/2020 1402   PROTEINUR NEGATIVE 05/30/2020 1402   UROBILINOGEN 0.2 05/30/2020 1402   NITRITE NEGATIVE 05/30/2020 1402   LEUKOCYTESUR NEGATIVE 05/30/2020 1402    STUDIES: [place] EKG SITE RITE  Result Date: 02/19/2021 If Site Rite image not attached, placement could not be confirmed due to current cardiac rhythm.    ELIGIBLE FOR AVAILABLE RESEARCH PROTOCOL: no  ASSESSMENT: 65 y.o. [place] woman   (1) status post right lumpectomy and axillary lymph node dissection in 2007 for a stage III invasive breast cancer, additional pathology details to be reviewed when available  (a) a total of 13 axillary lymph nodes were removed, 11 positive  (b) adjuvant chemotherapy consisted of docetaxel, doxorubicin and cyclophosphamide x6  (c) the patient received adjuvant radiation  (d) received letrozole briefly but did not tolerate it.  METASTATIC DISEASE: June 2021 (2) CT scans of the chest abdomen and pelvis 06/15/2020 shows widespread bony metastatic disease, no definitive visceral disease  (a) bone marrow biopsy  06/20/2020 nondiagnostic  (b) F 18 estradiol PET scan (cerianna) 07/31/2020 shows no estrogen uptake in the known bone lesions or elsewhere  (3) Capecitabine started on 08/24/2020 at 1500 mg twice daily, on radiation days only--with moderate palmar plantar erythrodysesthesia  (a) Xgeva started on 07/13/2020 given every 4 weeks  (b) capecitabine resumed 11/30/2020 at metronomic doses (1 g twice daily, with no breaks)  (c) discontinued as of 01/21/2021 with multiple side effects and a rising tumor marker  (4) palliative radiation 10/23/20 - 11/10/20 Site/dose:     1.  The left chest was treated to a dose of 37.5 Gy in 15 fractios using a 5-field 3D conformal technique. 2.  The right sacrum was treated to a dose of 37.5 Gy in 15 fractios using a 4-field isodose technique.  (5) pain management:  (a) started methadone 5 mg every 8 hours 11/30/2020, dose gradually increased to 15 mg every 8 hours starting 02/06/2021  (b) also on MSIR 15 mg as needed for breakthrough pain  (c) bowel prophylaxis: MiraLAX daily, stool softeners 2 tablets twice daily, prune juice and milk of magnesia  (6) cyclophosphamide, methotrexate, fluorouracil [CMF] chemotherapy was to start 02/13/2021, but patient decided against active treatment  (7) hospice referral placed 02/13/2021  (a) patient completed and notarized healthcare power of attorney document 02/13/2023, naming her Sister [name] as her healthcare power of attorney  (b) out of facility DO NOT RESUSCITATE order written and given to the patient   PLAN: Leilanee is now under the care of hospice at [facility].  I saw her in the emergency room shortly before she was transferred.   Virgie Dad. Cashlynn Yearwood, MD 02/22/21 1:09 PM Medical Oncology and Hematology [facility] [place], [place] [address] Tel. [phone]    Fax. [phone]   I, Wilburn Mylar, am acting as scribe for Dr. Virgie Dad. [place] Natarajan.  I, Lurline Del MD, have reviewed the above documentation for accuracy and completeness, and I agree with the above.   *Total Encounter Time as defined by the Centers for Medicare and Medicaid Services includes, in addition to the face-to-face time of a patient visit (documented in the note above) non-face-to-face time: obtaining and reviewing outside history, ordering and reviewing medications, tests or procedures, care coordination (communications with other health care professionals  or caregivers) and documentation in the medical record.

## 2021-02-21 NOTE — Telephone Encounter (Signed)
RN returned call to Cordelia Pen, Director of admitting at [facility].    RN notified that Dr. Jana Hakim would be attending physician.

## 2021-02-22 ENCOUNTER — Inpatient Hospital Stay: Payer: Medicaid Other | Admitting: Oncology

## 2021-02-26 ENCOUNTER — Other Ambulatory Visit: Payer: Self-pay

## 2021-02-27 ENCOUNTER — Inpatient Hospital Stay: Payer: Medicaid Other

## 2021-02-28 ENCOUNTER — Telehealth: Payer: Self-pay

## 2021-02-28 NOTE — Telephone Encounter (Signed)
Returned call to pt's sister regarding pt getting [facility] involved for home IV hydration. Currently Hospice involved with pt's care but sister states they are going to cancel their services. Let her know I will discuss next steps/plan for possible HH with MD and call her back. Sister verbalized understanding.

## 2021-03-01 ENCOUNTER — Other Ambulatory Visit: Payer: Self-pay | Admitting: Oncology

## 2021-03-01 ENCOUNTER — Other Ambulatory Visit: Payer: Self-pay

## 2021-03-01 ENCOUNTER — Encounter: Payer: Self-pay | Admitting: Oncology

## 2021-03-01 DIAGNOSIS — Z170 Estrogen receptor positive status [ER+]: Secondary | ICD-10-CM

## 2021-03-01 DIAGNOSIS — C50811 Malignant neoplasm of overlapping sites of right female breast: Secondary | ICD-10-CM

## 2021-03-01 MED ORDER — LORAZEPAM 0.5 MG PO TABS: 0.5000 mg | ORAL_TABLET | Freq: Three times a day (TID) | ORAL | 0 refills | Status: DC | PRN

## 2021-03-01 MED ORDER — PROMETHAZINE HCL 25 MG RE SUPP: 25.0000 mg | Freq: Four times a day (QID) | RECTAL | 3 refills | Status: DC | PRN

## 2021-03-01 MED ORDER — LORAZEPAM 0.5 MG PO TABS: 0.5000 mg | ORAL_TABLET | Freq: Four times a day (QID) | ORAL | 0 refills | Status: DC | PRN

## 2021-03-01 MED ORDER — ONDANSETRON 4 MG PO TBDP: 4.0000 mg | ORAL_TABLET | Freq: Three times a day (TID) | ORAL | 0 refills | Status: DC | PRN

## 2021-03-06 ENCOUNTER — Inpatient Hospital Stay: Payer: Medicaid Other | Attending: Oncology

## 2021-03-06 ENCOUNTER — Other Ambulatory Visit: Payer: Self-pay | Admitting: Oncology

## 2021-03-06 ENCOUNTER — Inpatient Hospital Stay: Payer: Medicaid Other

## 2021-03-06 ENCOUNTER — Other Ambulatory Visit: Payer: Self-pay

## 2021-03-06 DIAGNOSIS — F17200 Nicotine dependence, unspecified, uncomplicated: Secondary | ICD-10-CM | POA: Insufficient documentation

## 2021-03-06 DIAGNOSIS — R112 Nausea with vomiting, unspecified: Secondary | ICD-10-CM | POA: Diagnosis not present

## 2021-03-06 DIAGNOSIS — R52 Pain, unspecified: Secondary | ICD-10-CM | POA: Diagnosis not present

## 2021-03-06 DIAGNOSIS — C50811 Malignant neoplasm of overlapping sites of right female breast: Secondary | ICD-10-CM

## 2021-03-06 DIAGNOSIS — C7951 Secondary malignant neoplasm of bone: Secondary | ICD-10-CM | POA: Insufficient documentation

## 2021-03-06 DIAGNOSIS — C50911 Malignant neoplasm of unspecified site of right female breast: Secondary | ICD-10-CM | POA: Diagnosis present

## 2021-03-06 DIAGNOSIS — Z170 Estrogen receptor positive status [ER+]: Secondary | ICD-10-CM

## 2021-03-06 LAB — COMPREHENSIVE METABOLIC PANEL
ALT: 17 U/L (ref 0–44)
AST: 44 U/L — ABNORMAL HIGH (ref 15–41)
Albumin: 3.5 g/dL (ref 3.5–5.0)
Alkaline Phosphatase: 647 U/L — ABNORMAL HIGH (ref 38–126)
Anion gap: 8 (ref 5–15)
BUN: 21 mg/dL (ref 8–23)
CO2: 24 mmol/L (ref 22–32)
Calcium: 8.7 mg/dL — ABNORMAL LOW (ref 8.9–10.3)
Chloride: 100 mmol/L (ref 98–111)
Creatinine, Ser: 0.78 mg/dL (ref 0.44–1.00)
GFR, Estimated: >60 mL/min (ref >60)
Glucose, Bld: 107 mg/dL — ABNORMAL HIGH (ref 70–99)
Potassium: 3.4 mmol/L — ABNORMAL LOW (ref 3.5–5.1)
Sodium: 132 mmol/L — ABNORMAL LOW (ref 135–145)
Total Bilirubin: 0.8 mg/dL (ref 0.3–1.2)
Total Protein: 6.9 g/dL (ref 6.5–8.1)

## 2021-03-06 LAB — CBC WITH DIFFERENTIAL/PLATELET
Abs Immature Granulocytes: 0.08 10*3/uL — ABNORMAL HIGH (ref 0.00–0.07)
Basophils Absolute: 0 10*3/uL (ref 0.0–0.1)
Basophils Relative: 1 %
Eosinophils Absolute: 0.1 10*3/uL (ref 0.0–0.5)
Eosinophils Relative: 2 %
HCT: 37.9 % (ref 36.0–46.0)
Hemoglobin: 13.1 g/dL (ref 12.0–15.0)
Immature Granulocytes: 1 %
Lymphocytes Relative: 14 %
Lymphs Abs: 1 10*3/uL (ref 0.7–4.0)
MCH: 30.6 pg (ref 26.0–34.0)
MCHC: 34.6 g/dL (ref 30.0–36.0)
MCV: 88.6 fL (ref 80.0–100.0)
Monocytes Absolute: 0.5 10*3/uL (ref 0.1–1.0)
Monocytes Relative: 7 %
Neutro Abs: 5.8 10*3/uL (ref 1.7–7.7)
Neutrophils Relative %: 75 %
Platelets: 160 10*3/uL (ref 150–400)
RBC: 4.28 MIL/uL (ref 3.87–5.11)
RDW: 13.9 % (ref 11.5–15.5)
WBC: 7.6 10*3/uL (ref 4.0–10.5)
nRBC: 0 % (ref 0.0–0.2)

## 2021-03-06 LAB — CEA (IN HOUSE-CHCC): CEA (CHCC-In House): 5267.73 ng/mL — ABNORMAL HIGH (ref 0.00–5.00)

## 2021-03-06 NOTE — Progress Notes (Signed)
Per desk nurse Shawn Stall, no xgeva injection today.

## 2021-03-07 LAB — CANCER ANTIGEN 27.29: CA 27.29: 1109.6 U/mL — ABNORMAL HIGH (ref 0.0–38.6)

## 2021-03-08 ENCOUNTER — Other Ambulatory Visit: Payer: Self-pay | Admitting: Physician Assistant

## 2021-03-08 DIAGNOSIS — R1114 Bilious vomiting: Secondary | ICD-10-CM

## 2021-03-09 ENCOUNTER — Ambulatory Visit
Admission: RE | Admit: 2021-03-09 | Discharge: 2021-03-09 | Disposition: A | Discharge status: Home / Self Care | Payer: Medicaid Other | Source: Ambulatory Visit | Attending: Physician Assistant | Admitting: Physician Assistant

## 2021-03-09 DIAGNOSIS — R1114 Bilious vomiting: Secondary | ICD-10-CM

## 2021-03-11 NOTE — Progress Notes (Signed)
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  Since her last visit, she underwent right upper quadrant abdominal ultrasound on 03/09/2021, which was normal.  In fact she had extensive work-up through the GI doctors who were not able to determine a specific cause for the nausea or vomiting.   Lab Results  Component Value Date   ALKPHOS 694 (H) 03/06/2021   ALKPHOS 375 (H) 02/19/2021   ALKPHOS 391 (H) 02/07/2021   ALKPHOS 344 (H) 02/06/2021   ALKPHOS 276 (H) 01/23/2021   Lab Results  Component Value Date   CA2729 1,109.6 (H) 03/06/2021   CA2729 514.5 (H) 01/23/2021   CA2729 312.8 (H) 12/20/2020   CA2729 310.3 (H) 12/05/2020   CA2729 425.1 (H) 10/11/2020    REVIEW OF SYSTEMS: Catherine Tyler tells me the nausea and vomiting is the problem.  The pain is moderately to well controlled.  She is managing to have bowel movements every 2 or 3 days and they are not excessively hard.  The problem is retching which is nearly constant she says, does not let her keep anything down, and destroys her quality of life.  According to her sister, who is pretty much living with her right now Catherine Tyler sleeps all the time.  They have hired a hydration service that for $99 provides a liter of saline and include some nausea medication.  This initially  was helping but now does not seem to be making much of a difference according to the sister.  They are considering some strict comfort care at this point.  As far as I can tell from their report they are still under care of hospice.   COVID 19 VACCINATION STATUS: Not vaccinated as of February 2022   HISTORY OF CURRENT ILLNESS: From the original intake note:  "Catherine Tyler" has a history of right breast cancer, diagnosed in 2007.  From her report, this appears to have been about 3 cm and to have involved 11 of the 13 right axillary lymph nodes involved.  She was treated at [facility] by Dr.  She was treated under Dr Epimenio Foot in [facility] with lumpectomy, chemotherapy (TAC x6) , and radiation therapy.  She then tried Femara which she did not tolerate and no further antiestrogens were prescribed.  She presented to the ED yesterday, 06/15/2020, with diffuse back, chest, and abdominal pain, as well as unintentional weight loss. Chest x-ray performed at that time showed: concern for bone metastasis with pathologic fracture of left 5th rib.  She proceeded to chest, abdomen, pelvis CT that day, which showed: multifocal predominantly blastic-appearing osseous metastatic disease involving the thoracic and lumbar spine, multiple ribs most prominently at the left fifth through seventh ribs, and the right sacral ala; nonspecific mixed solid nodules and ill-defined ground-glass opacities in the left upper lobe measuring up to 7 mm and 6 mm in size; mild distal small bowel fecalization  without evidence of obstruction.  Of note, her most recent mammogram on file is a bilateral diagnostic scan from 08/11/2018. At that time, she presented with a burning sensation to the inferior right breast. Mammogram showed: breast density category B; no mammographic evidence of malignancy in the bilateral breasts.   The patient's subsequent history is as detailed below.   PAST MEDICAL HISTORY: Past Medical History:   Diagnosis Date   Cancer [facility])    Personal history of chemotherapy    Personal history of radiation therapy     PAST SURGICAL HISTORY: Past Surgical History:  Procedure Laterality Date   BREAST LUMPECTOMY     BREAST SURGERY      FAMILY HISTORY: Family History  Problem Relation Age of Onset   Hyperlipidemia Mother    Heart failure Mother   The patient's father died at age 96 from a myocardial infarction.  As far as the patient knows there is no history of cancer on that side of the family.  The patient's mother died at age 62 from renal failure.  The patient is not aware of any cancer on that side of the family.  The patient herself has 5 sisters, no brothers.  1 sister Catherine Tyler) had lung cancer.   GYNECOLOGIC HISTORY:  No LMP recorded (lmp unknown). Patient has had a hysterectomy. Menarche: 65 years old Age at first live birth: 65 years old GX P 1 LMP status post hysterectomy at age 51 secondary to fibroids; status post remote conization for in situ cervical carcinoma BSO?    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  She lives by herself with her dog Catherine Tyler who is a rescue.  The patient has been divorced 13 years as of June 2021.  Her son Catherine Tyler lives in [place] where he works in [employer].  The patient has 1 grandchild.  She attends [employer].   ADVANCED DIRECTIVES: To be discussed   HEALTH MAINTENANCE: Social History   Tobacco Use   Smoking status: Current Some Day Smoker   Smokeless tobacco: Never Used  Vaping Use   Vaping Use: Never used  Substance Use Topics   Alcohol use: Yes    Comment: on occasion   Drug use: Never     Colonoscopy: Never  PAP: 07/2018, negative  Bone density: Never   Allergies  Allergen Reactions   Demerol [Meperidine] Nausea And Vomiting   Sulfa Antibiotics Nausea And Vomiting   Tape Other (See Comments)    Takes off skin    Current  Outpatient Medications  Medication Sig Dispense Refill   bisacodyl (DULCOLAX) 10 MG suppository Place 1 suppository (10 mg total) rectally as needed for moderate constipation. (Patient not taking: Reported on 02/19/2021) 12 suppository 0   dexamethasone (DECADRON) 4 MG tablet Take 1 tablet (4 mg total) by mouth in the morning. 30 tablet 6   fluconazole (DIFLUCAN) 100 MG tablet Take 1 tablet (100 mg total) by mouth daily. 30 tablet 3   gabapentin (NEURONTIN) 300 MG capsule TAKE 3 CAPSULES BY MOUTH 4 TIMES A DAY (Patient taking differently: Take 300 mg by mouth 3 (three) times daily.) 360 capsule 0   lisinopril (ZESTRIL) 10 MG tablet Take 1 tablet (10 mg total) by mouth daily. (Patient not taking: No sig reported) 30 tablet 11   LORazepam (ATIVAN) 0.5 MG tablet Take 1 tablet (0.5 mg total) by mouth every 8 (eight) hours as needed for  anxiety (nausea). 30 tablet 0   LORazepam (ATIVAN) 0.5 MG tablet Take 1 tablet (0.5 mg total) by mouth every 6 (six) hours as needed for anxiety. 60 tablet 0   methadone (DOLOPHINE) 10 MG tablet Take 1.5 tablets (15 mg total) by mouth every 8 (eight) hours. 140 tablet 0   metoCLOPramide (REGLAN) 5 MG tablet Take 1 tablet (5 mg total) by mouth every 8 (eight) hours as needed for up to 30 doses for nausea, vomiting or refractory nausea / vomiting. 30 tablet 0   morphine (MSIR) 15 MG tablet Take 1 tablet (15 mg total) by mouth every 4 (four) hours as needed for severe pain. 180 tablet 0   omeprazole (PRILOSEC) 40 MG capsule Take 1 capsule (40 mg total) by mouth daily. 90 capsule 3   ondansetron (ZOFRAN ODT) 4 MG disintegrating tablet Take 1 tablet (4 mg total) by mouth every 8 (eight) hours as needed for nausea or vomiting. 20 tablet 0   polyethylene glycol (MIRALAX / GLYCOLAX) 17 g packet Take 17 g by mouth daily as needed for mild constipation.     prochlorperazine (COMPAZINE) 10 MG tablet Take 1 tablet (10 mg total) by mouth every 6 (six) hours as needed for  nausea or vomiting. 30 tablet 3   promethazine (PHENERGAN) 25 MG suppository Place 1 suppository (25 mg total) rectally every 6 (six) hours as needed for nausea or vomiting. 12 each 2   promethazine (PHENERGAN) 25 MG suppository Place 1 suppository (25 mg total) rectally every 6 (six) hours as needed for nausea or vomiting. 24 each 3   senna-docusate (SENOKOT-S) 8.6-50 MG tablet Take 2 tablets by mouth 2 (two) times daily. 120 tablet 0   sodium phosphate (FLEET) 7-19 GM/118ML ENEM Place 133 mLs (1 enema total) rectally daily as needed for severe constipation. 399 mL 0   No current facility-administered medications for this visit.    OBJECTIVE: White woman who appears stated age  89:   03/12/21 1709  BP: 91/75  Pulse: 87  Resp: 16  Temp: 98.4 F (36.9 C)  SpO2: 98%   Wt Readings from Last 3 Encounters:  02/13/21 128 lb 8 oz (58.3 kg)  02/07/21 124 lb 12.8 oz (56.6 kg)  02/06/21 128 lb 3.2 oz (58.2 kg)   There is no height or weight on file to calculate BMI.    Sclerae unicteric, EOMs intact Lungs no rales or rhonchi Heart regular rate and rhythm Abd soft, nontender, positive bowel sounds MSK no focal spinal tenderness Neuro: nonfocal, well oriented, frustrated and depressed affect Breasts: I did not palpated and mass in either breast.  Both axillae are benign   LAB RESULTS:  CMP     Component Value Date/Time   NA 132 (L) 03/06/2021 1325   NA 140 09/02/2018 1106   K 3.4 (L) 03/06/2021 1325   CL 100 03/06/2021 1325   CO2 24 03/06/2021 1325   GLUCOSE 107 (H) 03/06/2021 1325   BUN 21 03/06/2021 1325   BUN 22 09/02/2018 1106   CREATININE 0.78 03/06/2021 1325   CREATININE 0.58 02/07/2021 1458   CALCIUM 8.7 (L) 03/06/2021 1325   PROT 6.9 03/06/2021 1325   ALBUMIN 3.5 03/06/2021 1325   AST 44 (H) 03/06/2021 1325   AST 29 02/07/2021 1458   ALT 17 03/06/2021 1325   ALT 19 02/07/2021 1458   ALKPHOS 647 (H) 03/06/2021 1325   BILITOT 0.8 03/06/2021 1325   BILITOT  0.8 02/07/2021 1458   GFRNONAA >60 03/06/2021 1325  GFRNONAA >60 02/07/2021 1458   GFRAA >60 09/20/2020 1444    No results found for: TOTALPROTELP, ALBUMINELP, A1GS, A2GS, BETS, BETA2SER, GAMS, MSPIKE, SPEI  Lab Results  Component Value Date   WBC 7.6 03/06/2021   NEUTROABS 5.8 03/06/2021   HGB 13.1 03/06/2021   HCT 37.9 03/06/2021   MCV 88.6 03/06/2021   PLT 160 03/06/2021    No results found for: LABCA2  No components found for: YHCWCB762  No results for input(s): INR in the last 168 hours.  No results found for: LABCA2  No results found for: CAN199  No results found for: GBT517  No results found for: OHY073  Lab Results  Component Value Date   CA2729 1,109.6 (H) 03/06/2021    No components found for: HGQUANT  Lab Results  Component Value Date   CEA1 5,267.73 (H) 03/06/2021   /  CEA ([facility])  Date Value Ref Range Status  03/06/2021 5,267.73 (H) 0.00 - 5.00 ng/mL Final    Comment:    (NOTE) This test was performed using Architect's Chemiluminescent Microparticle Immunoassay. Values obtained from different assay methods cannot be used interchangeably. Please note that 5-10% of patients who smoke may see CEA levels up to 6.9 ng/mL. Performed at [facility], [place] [address], [place], [place] [address]      No results found for: AFPTUMOR  No results found for: CHROMOGRNA  No results found for: KPAFRELGTCHN, LAMBDASER, KAPLAMBRATIO (kappa/lambda light chains)  No results found for: HGBA, HGBA2QUANT, HGBFQUANT, HGBSQUAN (Hemoglobinopathy evaluation)   No results found for: LDH  No results found for: IRON, TIBC, IRONPCTSAT (Iron and TIBC)  No results found for: FERRITIN  Urinalysis    Component Value Date/Time   COLORURINE STRAW (A) 04/27/2020 0928   APPEARANCEUR CLEAR 04/27/2020 0928   LABSPEC 1.015 05/30/2020 1402   PHURINE 6.0 05/30/2020 1402   GLUCOSEU NEGATIVE 05/30/2020 1402   HGBUR NEGATIVE 05/30/2020  1402   BILIRUBINUR NEGATIVE 05/30/2020 1402   KETONESUR NEGATIVE 05/30/2020 1402   PROTEINUR NEGATIVE 05/30/2020 1402   UROBILINOGEN 0.2 05/30/2020 1402   NITRITE NEGATIVE 05/30/2020 1402   LEUKOCYTESUR NEGATIVE 05/30/2020 1402    STUDIES: [place] EKG SITE RITE  Result Date: 02/19/2021 If Site Rite image not attached, placement could not be confirmed due to current cardiac rhythm.  US Abdomen Limited RUQ (LIVER/GB)  Result Date: 03/09/2021 CLINICAL DATA:  Bilious vomiting and nausea. EXAM: ULTRASOUND ABDOMEN LIMITED RIGHT UPPER QUADRANT COMPARISON:  CT abdomen pelvis June 15, 2020 FINDINGS: Gallbladder: No gallstones or wall thickening visualized. No sonographic Murphy sign noted by sonographer. Common bile duct: Diameter: 5 mm Liver: No focal lesion identified. Within normal limits in parenchymal echogenicity. Portal vein is patent on color Doppler imaging with normal direction of blood flow towards the liver. Other: None. IMPRESSION: Normal right upper quadrant ultrasound. Electronically Signed   By: Dahlia Bailiff MD   On: 03/09/2021 12:53     ELIGIBLE FOR AVAILABLE RESEARCH PROTOCOL: no  ASSESSMENT: 65 y.o. [place] woman   (1) status post right lumpectomy and axillary lymph node dissection in 2007 for a stage III invasive breast cancer, additional pathology details to be reviewed when available  (a) a total of 13 axillary lymph nodes were removed, 11 positive  (b) adjuvant chemotherapy consisted of docetaxel, doxorubicin and cyclophosphamide x6  (c) the patient received adjuvant radiation  (d) received letrozole briefly but did not tolerate it.  METASTATIC DISEASE: June 2021 (2) CT scans of the chest abdomen and pelvis  06/15/2020 shows widespread bony metastatic disease, no definitive visceral disease  (a) bone marrow biopsy  06/20/2020 nondiagnostic  (b) F 18 estradiol PET scan (cerianna) 07/31/2020 shows no estrogen uptake in the known bone lesions or elsewhere  (3)  Capecitabine started on 08/24/2020 at 1500 mg twice daily, on radiation days only--with moderate palmar plantar erythrodysesthesia  (a) Xgeva started on 07/13/2020 given every 4 weeks  (b) capecitabine resumed 11/30/2020 at metronomic doses (1 g twice daily, with no breaks)  (c) discontinued as of 01/21/2021 with multiple side effects and a rising tumor marker  (4) palliative radiation 10/23/20 - 11/10/20 Site/dose:    1.  The left chest was treated to a dose of 37.5 Gy in 15 fractios using a 5-field 3D conformal technique. 2.  The right sacrum was treated to a dose of 37.5 Gy in 15 fractios using a 4-field isodose technique.  (5) pain management:  (a) started methadone 15 mg every 8 hours 11/30/2020, dose gradually increased to 15 mg every 8 hours starting 02/06/2021  (b) also on MSIR 15 mg as needed for breakthrough pain  (c) bowel prophylaxis: MiraLAX daily, stool softeners 2 tablets twice daily, prune juice and milk of magnesia  (6) cyclophosphamide, methotrexate, fluorouracil [CMF] chemotherapy was to start 02/13/2021, but patient decided against active treatment  (7) hospice referral placed 02/13/2021  (a) patient completed and notarized healthcare power of attorney document 02/13/2023, naming her Sister Catherine Tyler as her healthcare power of attorney  (b) out of facility DO NOT RESUSCITATE order written and given to the patient   PLAN: Catherine Tyler's case continues to be difficult.  Basically she has incurable breast cancer chiefly involving bones.  This can be controlled with treatment but she has opted against treatment for the cancer chiefly because of the nausea and vomiting problem which is really the big issue with her.  Because of that she was referred to hospice and was taken from the ED, where she received some fluids, to [facility], where she stayed approximately 24 hours.  She said she was feeling fine walking around and there was no need for her to stay there.  She is now living in  her home with her sister helping her out.  She is taking methadone 15 mg 3 times a day together with breakthrough MSIR and that is doing a fair job of controlling her pain.  Sometimes she vomits the medication up and has to retake it later.  With MiraLAX stool softeners and fleets enemas she has a bowel movement about every third day or so.  She is pretty much on just about everything I know that may help her nausea.  The only thing that does seem to make a difference is lorazepam.  I am going to increase the dose to 1 mg 3 times a day.  If this is chiefly psychogenic nausea that should help.  We are going to try this for a week.  If at the end of the week she really does not feel any better than she is considering "going into hospice", by which I think she needs some kind of terminal sedation or what ever it will take for her nausea to be controlled.  We will have a virtual visit in a week to see how upping the lorazepam would work  Total encounter time 35 minutes.Sarajane Jews C. Caryl Manas, MD 03/12/21 5:51 PM Medical Oncology and Hematology [facility] [place], [place] [address] Tel. [phone]  Fax. [phone]   I, Wilburn Mylar, am acting as scribe for Dr. Virgie Dad. Nikola Blackston.  I, Lurline Del MD, have reviewed the above documentation for accuracy and completeness, and I agree with the above.   *Total Encounter Time as defined by the Centers for Medicare and Medicaid Services includes, in addition to the face-to-face time of a patient visit (documented in the note above) non-face-to-face time: obtaining and reviewing outside history, ordering and reviewing medications, tests or procedures, care coordination (communications with other health care professionals or caregivers) and documentation in the medical record.

## 2021-03-12 ENCOUNTER — Other Ambulatory Visit: Payer: Self-pay

## 2021-03-12 ENCOUNTER — Inpatient Hospital Stay (HOSPITAL_BASED_OUTPATIENT_CLINIC_OR_DEPARTMENT_OTHER): Payer: Medicaid Other | Admitting: Oncology

## 2021-03-12 VITALS — BP 91/75 | HR 87 | Temp 98.4°F | Resp 16

## 2021-03-12 DIAGNOSIS — G893 Neoplasm related pain (acute) (chronic): Secondary | ICD-10-CM | POA: Diagnosis not present

## 2021-03-12 DIAGNOSIS — C7951 Secondary malignant neoplasm of bone: Secondary | ICD-10-CM

## 2021-03-12 DIAGNOSIS — C50811 Malignant neoplasm of overlapping sites of right female breast: Secondary | ICD-10-CM

## 2021-03-12 DIAGNOSIS — R112 Nausea with vomiting, unspecified: Secondary | ICD-10-CM | POA: Diagnosis not present

## 2021-03-12 DIAGNOSIS — Z170 Estrogen receptor positive status [ER+]: Secondary | ICD-10-CM

## 2021-03-12 MED ORDER — METHADONE HCL 10 MG PO TABS: 15.0000 mg | ORAL_TABLET | Freq: Three times a day (TID) | ORAL | 0 refills | Status: AC

## 2021-03-12 MED ORDER — LORAZEPAM 1 MG PO TABS: 1.0000 mg | ORAL_TABLET | Freq: Three times a day (TID) | ORAL | 0 refills | Status: AC

## 2021-03-12 MED ORDER — ONDANSETRON 4 MG PO TBDP: 4.0000 mg | ORAL_TABLET | Freq: Three times a day (TID) | ORAL | 0 refills | Status: AC | PRN

## 2021-03-12 MED ORDER — MORPHINE SULFATE 15 MG PO TABS: 15.0000 mg | ORAL_TABLET | ORAL | 0 refills | Status: AC | PRN

## 2021-03-12 MED ORDER — METOCLOPRAMIDE HCL 5 MG PO TABS: 5.0000 mg | ORAL_TABLET | Freq: Three times a day (TID) | ORAL | 4 refills | Status: AC | PRN

## 2021-03-12 MED ORDER — PROMETHAZINE HCL 25 MG RE SUPP: 25.0000 mg | Freq: Four times a day (QID) | RECTAL | 2 refills | Status: AC | PRN

## 2021-03-13 ENCOUNTER — Telehealth: Payer: Self-pay | Admitting: Oncology

## 2021-03-13 NOTE — Telephone Encounter (Signed)
Scheduled appointment per 3/14. Spoke to patient who is aware of appointment date and time.

## 2021-03-17 NOTE — Progress Notes (Signed)
[name]  [phone]) [phone] [phone]     ID: Catherine Tyler DOB: March 23, 1956  MR#: [id number]  HGD#:[id number]  Patient Care Team: Patient, No Pcp Per as PCP - General (General Practice) Legrand Como (Hematology and Oncology) Yuan Gann, Virgie Dad, MD as Consulting Physician (Oncology) Chyrl Civatte Arletta Bale as Physician Assistant (Neurosurgery) Clarene Essex, MD as Consulting Physician (Gastroenterology) [name], MD OTHER MD:  I phoned Wynnie Pacetti on 03/19/21 at  2:45 PM EDT but there was no answer.  I left a voicemail message requesting a return call .   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    Lab Results  Component Value Date   ALKPHOS 647 (H) 03/06/2021   ALKPHOS 375 (H) 02/19/2021   ALKPHOS 391 (H) 02/07/2021   ALKPHOS 344 (H) 02/06/2021   ALKPHOS 276 (H) 01/23/2021   Lab Results  Component Value Date   CA2729 1,109.6 (H) 03/06/2021   CA2729 514.5 (H) 01/23/2021   CA2729 312.8 (H) 12/20/2020   CA2729 310.3 (H) 12/05/2020   CA2729 425.1 (H) 10/11/2020    REVIEW OF SYSTEMS: Jelissa    COVID 19 VACCINATION STATUS: Not vaccinated as of February 2022   HISTORY OF CURRENT ILLNESS: From the original intake note:  "Catherine Tyler" has a history of right breast cancer, diagnosed in 2007.  From her report, this appears to have been about 3 cm and to have involved 11 of the 13 right axillary lymph nodes involved.  She was treated at [facility] by Dr.  She was treated under Dr Epimenio Foot in [facility] with lumpectomy, chemotherapy (TAC x6) , and radiation therapy.  She then tried Femara which she did not tolerate and no further antiestrogens were prescribed.  She presented to the ED yesterday, 06/15/2020, with diffuse back, chest, and  abdominal pain, as well as unintentional weight loss. Chest x-ray performed at that time showed: concern for bone metastasis with pathologic fracture of left 5th rib.  She proceeded to chest, abdomen, pelvis CT that day, which showed: multifocal predominantly blastic-appearing osseous metastatic disease involving the thoracic and lumbar spine, multiple ribs most prominently at the left fifth through seventh ribs, and the right sacral ala; nonspecific mixed solid nodules and ill-defined ground-glass opacities in the left upper lobe measuring up to 7 mm and 6 mm in size; mild distal small bowel fecalization without evidence of obstruction.  Of note, her most recent mammogram on file is a bilateral diagnostic scan from 08/11/2018. At that time, she presented with a burning sensation to the inferior right breast. Mammogram showed: breast density category B; no mammographic evidence of malignancy in the bilateral breasts.   The patient's subsequent history is as detailed below.   PAST MEDICAL HISTORY: Past Medical History:  Diagnosis Date   Cancer [facility])    Personal history of chemotherapy    Personal history of radiation therapy     PAST SURGICAL HISTORY: Past Surgical History:  Procedure Laterality Date   BREAST LUMPECTOMY     BREAST SURGERY      FAMILY HISTORY: Family History  Problem Relation Age of Onset   Hyperlipidemia Mother    Heart failure Mother   The patient's father died at age 14 from a myocardial infarction.  As far as the patient knows there is no history  of cancer on that side of the family.  The patient's mother died at age 53 from renal failure.  The patient is not aware of any cancer on that side of the family.  The patient herself has 5 sisters, no brothers.  1 sister [name]) had lung cancer.   GYNECOLOGIC HISTORY:  No LMP recorded (lmp unknown). Patient has had a hysterectomy. Menarche: 65 years old Age at first live birth: 65 years old [place] P 1 LMP status  post hysterectomy at age 48 secondary to fibroids; status post remote conization for in situ cervical carcinoma BSO?    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  She lives by herself with her dog [name] who is a rescue.  The patient has been divorced 13 years as of June 2021.  Her son Chyane Greer lives in [place] where he works in Architect.  The patient has 1 grandchild.  She attends [employer].   ADVANCED DIRECTIVES: To be discussed   HEALTH MAINTENANCE: Social History   Tobacco Use   Smoking status: Current Some Day Smoker   Smokeless tobacco: Never Used  Vaping Use   Vaping Use: Never used  Substance Use Topics   Alcohol use: Yes    Comment: on occasion   Drug use: Never     Colonoscopy: Never  PAP: 07/2018, negative  Bone density: Never   Allergies  Allergen Reactions   Demerol [Meperidine] Nausea And Vomiting   Sulfa Antibiotics Nausea And Vomiting   Tape Other (See Comments)    Takes off skin    Current Outpatient Medications  Medication Sig Dispense Refill   bisacodyl (DULCOLAX) 10 MG suppository Place 1 suppository (10 mg total) rectally as needed for moderate constipation. (Patient not taking: Reported on 02/19/2021) 12 suppository 0   dexamethasone (DECADRON) 4 MG tablet Take 1 tablet (4 mg total) by mouth in the morning. 30 tablet 6   fluconazole (DIFLUCAN) 100 MG tablet Take 1 tablet (100 mg total) by mouth daily. 30 tablet 3   LORazepam (ATIVAN) 1 MG tablet Take 1 tablet (1 mg total) by mouth every 8 (eight) hours. 90 tablet 0   methadone (DOLOPHINE) 10 MG tablet Take 1.5 tablets (15 mg total) by mouth every 8 (eight) hours. 140 tablet 0   metoCLOPramide (REGLAN) 5 MG tablet Take 1 tablet (5 mg total) by mouth every 8 (eight) hours as needed for up to 30 doses for nausea, vomiting or refractory nausea / vomiting. 90 tablet 4   morphine (MSIR) 15 MG tablet Take 1  tablet (15 mg total) by mouth every 4 (four) hours as needed for severe pain. 180 tablet 0   omeprazole (PRILOSEC) 40 MG capsule Take 1 capsule (40 mg total) by mouth daily. 90 capsule 3   ondansetron (ZOFRAN ODT) 4 MG disintegrating tablet Take 1 tablet (4 mg total) by mouth every 8 (eight) hours as needed for nausea or vomiting. 20 tablet 0   polyethylene glycol (MIRALAX / GLYCOLAX) 17 g packet Take 17 g by mouth daily as needed for mild constipation.     promethazine (PHENERGAN) 25 MG suppository Place 1 suppository (25 mg total) rectally every 6 (six) hours as needed for nausea or vomiting. 12 each 2   senna-docusate (SENOKOT-S) 8.6-50 MG tablet Take 2 tablets by mouth 2 (two) times daily. 120 tablet 0   sodium phosphate (FLEET) 7-19 GM/118ML ENEM Place 133 mLs (1 enema total)  rectally daily as needed for severe constipation. 399 mL 0   No current facility-administered medications for this visit.    OBJECTIVE: White woman who appears stated age  There were no vitals filed for this visit. Wt Readings from Last 3 Encounters:  02/13/21 128 lb 8 oz (58.3 kg)  02/07/21 124 lb 12.8 oz (56.6 kg)  02/06/21 128 lb 3.2 oz (58.2 kg)   There is no height or weight on file to calculate BMI.    Telemedicine visit 03/19/2021  LAB RESULTS:  CMP     Component Value Date/Time   NA 132 (L) 03/06/2021 1325   NA 140 09/02/2018 1106   K 3.4 (L) 03/06/2021 1325   CL 100 03/06/2021 1325   CO2 24 03/06/2021 1325   GLUCOSE 107 (H) 03/06/2021 1325   BUN 21 03/06/2021 1325   BUN 22 09/02/2018 1106   CREATININE 0.78 03/06/2021 1325   CREATININE 0.58 02/07/2021 1458   CALCIUM 8.7 (L) 03/06/2021 1325   PROT 6.9 03/06/2021 1325   ALBUMIN 3.5 03/06/2021 1325   AST 44 (H) 03/06/2021 1325   AST 29 02/07/2021 1458   ALT 17 03/06/2021 1325   ALT 19 02/07/2021 1458   ALKPHOS 647 (H) 03/06/2021 1325   BILITOT 0.8 03/06/2021 1325   BILITOT 0.8 02/07/2021 1458   GFRNONAA >60 03/06/2021 1325    GFRNONAA >60 02/07/2021 1458   GFRAA >60 09/20/2020 1444    No results found for: TOTALPROTELP, ALBUMINELP, A1GS, A2GS, BETS, BETA2SER, GAMS, MSPIKE, SPEI  Lab Results  Component Value Date   WBC 7.6 03/06/2021   NEUTROABS 5.8 03/06/2021   HGB 13.1 03/06/2021   HCT 37.9 03/06/2021   MCV 88.6 03/06/2021   PLT 160 03/06/2021    No results found for: LABCA2  No components found for: OECXFQ722  No results for input(s): INR in the last 168 hours.  No results found for: LABCA2  No results found for: CAN199  No results found for: VJD051  No results found for: GZF582  Lab Results  Component Value Date   CA2729 1,109.6 (H) 03/06/2021    No components found for: HGQUANT  Lab Results  Component Value Date   CEA1 5,267.73 (H) 03/06/2021   /  CEA ([facility]In [facility])  Date Value Ref Range Status  03/06/2021 5,267.73 (H) 0.00 - 5.00 ng/mL Final    Comment:    (NOTE) This test was performed using Architect's Chemiluminescent Microparticle Immunoassay. Values obtained from different assay methods cannot be used interchangeably. Please note that 5-10% of patients who smoke may see CEA levels up to 6.9 ng/mL. Performed at [facility], [place] [address], [place], [place] [address]      No results found for: AFPTUMOR  No results found for: CHROMOGRNA  No results found for: KPAFRELGTCHN, LAMBDASER, KAPLAMBRATIO (kappa/lambda light chains)  No results found for: HGBA, HGBA2QUANT, HGBFQUANT, HGBSQUAN (Hemoglobinopathy evaluation)   No results found for: LDH  No results found for: IRON, TIBC, IRONPCTSAT (Iron and TIBC)  No results found for: FERRITIN  Urinalysis    Component Value Date/Time   COLORURINE STRAW (A) 04/27/2020 0928   APPEARANCEUR CLEAR 04/27/2020 0928   LABSPEC 1.015 05/30/2020 1402   PHURINE 6.0 05/30/2020 1402   GLUCOSEU NEGATIVE 05/30/2020 1402   HGBUR NEGATIVE 05/30/2020 1402   BILIRUBINUR NEGATIVE 05/30/2020 1402    KETONESUR NEGATIVE 05/30/2020 1402   PROTEINUR NEGATIVE 05/30/2020 1402   UROBILINOGEN 0.2 05/30/2020 1402   NITRITE NEGATIVE 05/30/2020 1402   LEUKOCYTESUR  NEGATIVE 05/30/2020 1402    STUDIES: [place] EKG SITE RITE  Result Date: 02/19/2021 If [facility] image not attached, placement could not be confirmed due to current cardiac rhythm.  US Abdomen Limited RUQ (LIVER/GB)  Result Date: 03/09/2021 CLINICAL DATA:  Bilious vomiting and nausea. EXAM: ULTRASOUND ABDOMEN LIMITED RIGHT UPPER QUADRANT COMPARISON:  CT abdomen pelvis June 15, 2020 FINDINGS: Gallbladder: No gallstones or wall thickening visualized. No sonographic Murphy sign noted by sonographer. Common bile duct: Diameter: 5 mm Liver: No focal lesion identified. Within normal limits in parenchymal echogenicity. Portal vein is patent on color Doppler imaging with normal direction of blood flow towards the liver. Other: None. IMPRESSION: Normal right upper quadrant ultrasound. Electronically Signed   By: Dahlia Bailiff MD   On: 03/09/2021 12:53     ELIGIBLE FOR AVAILABLE RESEARCH PROTOCOL: no  ASSESSMENT: 65 y.o. [place] woman   (1) status post right lumpectomy and axillary lymph node dissection in 2007 for a stage III invasive breast cancer, additional pathology details to be reviewed when available  (a) a total of 13 axillary lymph nodes were removed, 11 positive  (b) adjuvant chemotherapy consisted of docetaxel, doxorubicin and cyclophosphamide x6  (c) the patient received adjuvant radiation  (d) received letrozole briefly but did not tolerate it.  METASTATIC DISEASE: June 2021 (2) CT scans of the chest abdomen and pelvis 06/15/2020 shows widespread bony metastatic disease, no definitive visceral disease  (a) bone marrow biopsy  06/20/2020 nondiagnostic  (b) F 18 estradiol PET scan (cerianna) 07/31/2020 shows no estrogen uptake in the known bone lesions or elsewhere  (3) Capecitabine started on 08/24/2020 at 1500 mg twice daily,  on radiation days only--with moderate palmar plantar erythrodysesthesia  (a) Xgeva started on 07/13/2020 given every 4 weeks  (b) capecitabine resumed 11/30/2020 at metronomic doses (1 g twice daily, with no breaks)  (c) discontinued as of 01/21/2021 with multiple side effects and a rising tumor marker  (4) palliative radiation 10/23/20 - 11/10/20 Site/dose:    1.  The left chest was treated to a dose of 37.5 Gy in 15 fractios using a 5-field 3D conformal technique. 2.  The right sacrum was treated to a dose of 37.5 Gy in 15 fractios using a 4-field isodose technique.  (5) pain management:  (a) started methadone 15 mg every 8 hours 11/30/2020, dose gradually increased to 15 mg every 8 hours starting 02/06/2021  (b) also on MSIR 15 mg as needed for breakthrough pain  (c) bowel prophylaxis: MiraLAX daily, stool softeners 2 tablets twice daily, prune juice and milk of magnesia  (6) cyclophosphamide, methotrexate, fluorouracil [CMF] chemotherapy was to start 02/13/2021, but patient decided against active treatment  (7) hospice referral placed 02/13/2021  (a) patient completed and notarized healthcare power of attorney document 02/13/2023, naming her Sister [name] as her healthcare power of attorney  (b) out of facility DO NOT RESUSCITATE order written and given to the patient   PLAN: Hedy did not answer her phone for our virtual visit today. We will reschedule this.   Virgie Dad. Zannie Runkle, MD 03/19/21 5:37 PM Medical Oncology and Hematology [facility] [place], [place] [address] Tel. [phone]    Fax. [phone]   I, Wilburn Mylar, am acting as scribe for Dr. Virgie Dad. Dalton Mille.  I, Lurline Del MD, have reviewed the above documentation for accuracy and completeness, and I agree with the above.   *Total Encounter Time as defined by the Centers for Medicare and Medicaid Services includes,  in addition to the face-to-face time of a patient  visit (documented in the note above) non-face-to-face time: obtaining and reviewing outside history, ordering and reviewing medications, tests or procedures, care coordination (communications with other health care professionals or caregivers) and documentation in the medical record.

## 2021-03-19 ENCOUNTER — Inpatient Hospital Stay: Payer: Medicaid Other | Admitting: Oncology

## 2021-03-19 DIAGNOSIS — C7951 Secondary malignant neoplasm of bone: Secondary | ICD-10-CM

## 2021-03-19 DIAGNOSIS — Z170 Estrogen receptor positive status [ER+]: Secondary | ICD-10-CM

## 2021-03-19 DIAGNOSIS — C50811 Malignant neoplasm of overlapping sites of right female breast: Secondary | ICD-10-CM

## 2021-03-20 ENCOUNTER — Telehealth: Payer: Self-pay | Admitting: Oncology

## 2021-03-20 NOTE — Telephone Encounter (Signed)
Scheduled appt per 3/21 los. Called pt, no answer. Left msg with appt date and time.

## 2021-03-22 ENCOUNTER — Other Ambulatory Visit (HOSPITAL_BASED_OUTPATIENT_CLINIC_OR_DEPARTMENT_OTHER): Payer: Self-pay

## 2021-03-22 ENCOUNTER — Ambulatory Visit: Payer: Medicaid Other | Admitting: Oncology

## 2021-03-27 ENCOUNTER — Other Ambulatory Visit: Payer: Self-pay | Admitting: Oncology

## 2021-03-28 ENCOUNTER — Other Ambulatory Visit: Payer: Self-pay | Admitting: Oncology

## 2021-03-28 NOTE — Progress Notes (Signed)
Catherine Tyler is now at [facility].  I have canceled visits and treatments here.

## 2021-03-29 ENCOUNTER — Telehealth: Payer: Self-pay | Admitting: Oncology

## 2021-03-29 NOTE — Telephone Encounter (Signed)
Cancelled appts per 3/30 sch msg.

## 2021-04-03 ENCOUNTER — Ambulatory Visit: Payer: Medicaid Other

## 2021-04-03 ENCOUNTER — Other Ambulatory Visit: Payer: Medicaid Other

## 2021-04-05 ENCOUNTER — Telehealth: Payer: Medicaid Other | Admitting: Adult Health

## 2021-04-05 ENCOUNTER — Other Ambulatory Visit: Payer: Self-pay | Admitting: Oncology

## 2021-04-11 ENCOUNTER — Other Ambulatory Visit: Payer: Self-pay | Admitting: Oncology

## 2021-04-29 DEATH — deceased
